# Patient Record
Sex: Female | Born: 1955
Health system: Southern US, Community
[De-identification: ages and names within clinical notes are randomized; demographics above are authoritative.]

## PROBLEM LIST (undated history)

## (undated) DIAGNOSIS — F419 Anxiety disorder, unspecified: Secondary | ICD-10-CM

## (undated) DIAGNOSIS — Z87442 Personal history of urinary calculi: Secondary | ICD-10-CM

## (undated) DIAGNOSIS — E785 Hyperlipidemia, unspecified: Secondary | ICD-10-CM

## (undated) DIAGNOSIS — S82141A Displaced bicondylar fracture of right tibia, initial encounter for closed fracture: Secondary | ICD-10-CM

## (undated) DIAGNOSIS — F32A Depression, unspecified: Secondary | ICD-10-CM

## (undated) DIAGNOSIS — F329 Major depressive disorder, single episode, unspecified: Secondary | ICD-10-CM

## (undated) DIAGNOSIS — I10 Essential (primary) hypertension: Secondary | ICD-10-CM

## (undated) DIAGNOSIS — T7840XA Allergy, unspecified, initial encounter: Secondary | ICD-10-CM

## (undated) DIAGNOSIS — M7660 Achilles tendinitis, unspecified leg: Secondary | ICD-10-CM

## (undated) DIAGNOSIS — A64 Unspecified sexually transmitted disease: Secondary | ICD-10-CM

## (undated) DIAGNOSIS — S82121A Displaced fracture of lateral condyle of right tibia, initial encounter for closed fracture: Secondary | ICD-10-CM

## (undated) HISTORY — DX: Allergy, unspecified, initial encounter: T78.40XA

## (undated) HISTORY — DX: Hypercalcemia: E83.52

## (undated) HISTORY — DX: Personal history of urinary calculi: Z87.442

## (undated) HISTORY — PX: CHOLECYSTECTOMY: SHX55

## (undated) HISTORY — DX: Unspecified sexually transmitted disease: A64

## (undated) HISTORY — DX: Achilles tendinitis, unspecified leg: M76.60

## (undated) HISTORY — PX: TONSILLECTOMY: SUR1361

## (undated) HISTORY — DX: Essential (primary) hypertension: I10

## (undated) HISTORY — PX: APPENDECTOMY: SHX54

## (undated) HISTORY — DX: Hyperlipidemia, unspecified: E78.5

---

## 1997-10-19 ENCOUNTER — Other Ambulatory Visit: Admission: RE | Admit: 1997-10-19 | Discharge: 1997-10-19 | Payer: Self-pay | Admitting: Obstetrics and Gynecology

## 1997-11-19 ENCOUNTER — Encounter: Admission: RE | Admit: 1997-11-19 | Discharge: 1998-02-17 | Payer: Self-pay | Admitting: Family Medicine

## 1999-05-23 ENCOUNTER — Other Ambulatory Visit: Admission: RE | Admit: 1999-05-23 | Discharge: 1999-05-23 | Payer: Self-pay | Admitting: *Deleted

## 2000-10-21 ENCOUNTER — Other Ambulatory Visit: Admission: RE | Admit: 2000-10-21 | Discharge: 2000-10-21 | Payer: Self-pay | Admitting: *Deleted

## 2000-10-26 ENCOUNTER — Ambulatory Visit (HOSPITAL_COMMUNITY): Admission: RE | Admit: 2000-10-26 | Discharge: 2000-10-26 | Payer: Self-pay | Admitting: *Deleted

## 2000-10-26 ENCOUNTER — Encounter: Payer: Self-pay | Admitting: Obstetrics and Gynecology

## 2002-05-30 ENCOUNTER — Other Ambulatory Visit: Admission: RE | Admit: 2002-05-30 | Discharge: 2002-05-30 | Payer: Self-pay | Admitting: Obstetrics and Gynecology

## 2002-06-02 ENCOUNTER — Encounter: Payer: Self-pay | Admitting: Obstetrics and Gynecology

## 2002-06-02 ENCOUNTER — Ambulatory Visit (HOSPITAL_COMMUNITY): Admission: RE | Admit: 2002-06-02 | Discharge: 2002-06-02 | Payer: Self-pay | Admitting: Obstetrics and Gynecology

## 2003-07-23 ENCOUNTER — Other Ambulatory Visit: Admission: RE | Admit: 2003-07-23 | Discharge: 2003-07-23 | Payer: Self-pay | Admitting: Obstetrics and Gynecology

## 2004-07-30 ENCOUNTER — Other Ambulatory Visit: Admission: RE | Admit: 2004-07-30 | Discharge: 2004-07-30 | Payer: Self-pay | Admitting: Obstetrics and Gynecology

## 2004-10-15 ENCOUNTER — Encounter: Admission: RE | Admit: 2004-10-15 | Discharge: 2004-10-15 | Payer: Self-pay | Admitting: Orthopedic Surgery

## 2005-11-02 LAB — CONVERTED CEMR LAB: Pap Smear: NORMAL

## 2005-11-11 ENCOUNTER — Ambulatory Visit (HOSPITAL_COMMUNITY): Admission: RE | Admit: 2005-11-11 | Discharge: 2005-11-11 | Payer: Self-pay | Admitting: Obstetrics and Gynecology

## 2005-11-23 ENCOUNTER — Other Ambulatory Visit: Admission: RE | Admit: 2005-11-23 | Discharge: 2005-11-23 | Payer: Self-pay | Admitting: Obstetrics & Gynecology

## 2006-10-20 ENCOUNTER — Encounter: Payer: Self-pay | Admitting: Family Medicine

## 2006-11-18 ENCOUNTER — Ambulatory Visit: Payer: Self-pay | Admitting: Family Medicine

## 2006-11-18 DIAGNOSIS — E1169 Type 2 diabetes mellitus with other specified complication: Secondary | ICD-10-CM | POA: Insufficient documentation

## 2006-11-18 DIAGNOSIS — E785 Hyperlipidemia, unspecified: Secondary | ICD-10-CM

## 2006-11-18 DIAGNOSIS — I10 Essential (primary) hypertension: Secondary | ICD-10-CM | POA: Insufficient documentation

## 2006-11-18 DIAGNOSIS — E7800 Pure hypercholesterolemia, unspecified: Secondary | ICD-10-CM | POA: Insufficient documentation

## 2006-11-18 DIAGNOSIS — E119 Type 2 diabetes mellitus without complications: Secondary | ICD-10-CM | POA: Insufficient documentation

## 2006-11-18 DIAGNOSIS — J309 Allergic rhinitis, unspecified: Secondary | ICD-10-CM | POA: Insufficient documentation

## 2006-11-25 ENCOUNTER — Encounter: Payer: Self-pay | Admitting: Family Medicine

## 2006-12-04 LAB — CONVERTED CEMR LAB: Pap Smear: NORMAL

## 2006-12-22 ENCOUNTER — Telehealth: Payer: Self-pay | Admitting: Family Medicine

## 2006-12-27 ENCOUNTER — Other Ambulatory Visit: Admission: RE | Admit: 2006-12-27 | Discharge: 2006-12-27 | Payer: Self-pay | Admitting: Obstetrics & Gynecology

## 2006-12-31 ENCOUNTER — Encounter: Payer: Self-pay | Admitting: Family Medicine

## 2007-01-13 ENCOUNTER — Ambulatory Visit: Payer: Self-pay | Admitting: Family Medicine

## 2007-01-13 LAB — CONVERTED CEMR LAB: Cholesterol, target level: 200 mg/dL

## 2007-01-18 LAB — CONVERTED CEMR LAB
AST: 26 units/L (ref 0–37)
CO2: 27 meq/L (ref 19–32)
Chloride: 104 meq/L (ref 96–112)
Cholesterol: 155 mg/dL (ref 0–200)
Creatinine, Ser: 0.6 mg/dL (ref 0.4–1.2)
Creatinine,U: 104.3 mg/dL
Glucose, Bld: 246 mg/dL — ABNORMAL HIGH (ref 70–99)
HDL: 37.7 mg/dL — ABNORMAL LOW (ref >39.0)
Microalb, Ur: 0.7 mg/dL (ref 0.0–1.9)
Potassium: 4.3 meq/L (ref 3.5–5.1)
Sodium: 138 meq/L (ref 135–145)
Total Bilirubin: 0.6 mg/dL (ref 0.3–1.2)
Total Protein: 6.6 g/dL (ref 6.0–8.3)
Triglycerides: 110 mg/dL (ref 0–149)

## 2007-02-08 ENCOUNTER — Encounter: Payer: Self-pay | Admitting: Family Medicine

## 2007-03-31 ENCOUNTER — Ambulatory Visit (HOSPITAL_COMMUNITY): Admission: RE | Admit: 2007-03-31 | Discharge: 2007-03-31 | Payer: Self-pay | Admitting: Obstetrics and Gynecology

## 2007-04-18 ENCOUNTER — Telehealth: Payer: Self-pay | Admitting: Family Medicine

## 2007-04-20 ENCOUNTER — Encounter: Payer: Self-pay | Admitting: Family Medicine

## 2007-04-22 ENCOUNTER — Encounter (INDEPENDENT_AMBULATORY_CARE_PROVIDER_SITE_OTHER): Payer: Self-pay | Admitting: *Deleted

## 2007-04-22 ENCOUNTER — Ambulatory Visit: Payer: Self-pay | Admitting: Family Medicine

## 2007-04-26 ENCOUNTER — Ambulatory Visit: Payer: Self-pay | Admitting: Family Medicine

## 2007-08-09 ENCOUNTER — Encounter (INDEPENDENT_AMBULATORY_CARE_PROVIDER_SITE_OTHER): Payer: Self-pay | Admitting: *Deleted

## 2007-08-26 ENCOUNTER — Encounter: Admission: RE | Admit: 2007-08-26 | Discharge: 2007-08-26 | Payer: Self-pay | Admitting: General Surgery

## 2007-08-29 ENCOUNTER — Encounter (INDEPENDENT_AMBULATORY_CARE_PROVIDER_SITE_OTHER): Payer: Self-pay | Admitting: General Surgery

## 2007-08-29 ENCOUNTER — Ambulatory Visit (HOSPITAL_BASED_OUTPATIENT_CLINIC_OR_DEPARTMENT_OTHER): Admission: RE | Admit: 2007-08-29 | Discharge: 2007-08-29 | Payer: Self-pay | Admitting: General Surgery

## 2007-08-30 ENCOUNTER — Ambulatory Visit: Payer: Self-pay | Admitting: Family Medicine

## 2007-08-31 LAB — CONVERTED CEMR LAB
Albumin: 3.9 g/dL (ref 3.5–5.2)
Alkaline Phosphatase: 65 units/L (ref 39–117)
BUN: 17 mg/dL (ref 6–23)
Calcium: 9.7 mg/dL (ref 8.4–10.5)
Cholesterol: 134 mg/dL (ref 0–200)
GFR calc Af Amer: 134 mL/min
GFR calc non Af Amer: 111 mL/min
Glucose, Bld: 241 mg/dL — ABNORMAL HIGH (ref 70–99)
HDL: 42.1 mg/dL (ref >39.0)
LDL Cholesterol: 71 mg/dL (ref 0–99)
Potassium: 4.1 meq/L (ref 3.5–5.1)
Sodium: 138 meq/L (ref 135–145)
Total Protein: 6.6 g/dL (ref 6.0–8.3)
VLDL: 21 mg/dL (ref 0–40)

## 2007-09-02 ENCOUNTER — Ambulatory Visit: Payer: Self-pay | Admitting: Family Medicine

## 2007-09-29 ENCOUNTER — Ambulatory Visit: Payer: Self-pay | Admitting: Family Medicine

## 2007-09-30 ENCOUNTER — Telehealth: Payer: Self-pay | Admitting: Family Medicine

## 2007-10-27 ENCOUNTER — Encounter: Payer: Self-pay | Admitting: Family Medicine

## 2007-11-11 ENCOUNTER — Telehealth: Payer: Self-pay | Admitting: Family Medicine

## 2007-11-28 ENCOUNTER — Ambulatory Visit: Payer: Self-pay | Admitting: Family Medicine

## 2007-11-29 LAB — CONVERTED CEMR LAB: Hgb A1c MFr Bld: 7 % — ABNORMAL HIGH (ref 4.6–6.0)

## 2007-11-30 ENCOUNTER — Ambulatory Visit: Payer: Self-pay | Admitting: Family Medicine

## 2007-12-04 LAB — HM MAMMOGRAPHY: HM Mammogram: NORMAL

## 2008-02-22 ENCOUNTER — Ambulatory Visit: Payer: Self-pay | Admitting: Family Medicine

## 2008-02-27 ENCOUNTER — Ambulatory Visit: Payer: Self-pay | Admitting: Family Medicine

## 2008-02-27 DIAGNOSIS — F329 Major depressive disorder, single episode, unspecified: Secondary | ICD-10-CM | POA: Insufficient documentation

## 2008-02-27 DIAGNOSIS — F3289 Other specified depressive episodes: Secondary | ICD-10-CM | POA: Insufficient documentation

## 2008-02-27 LAB — CONVERTED CEMR LAB
ALT: 36 units/L — ABNORMAL HIGH (ref 0–35)
AST: 24 units/L (ref 0–37)
Albumin: 3.9 g/dL (ref 3.5–5.2)
Alkaline Phosphatase: 68 units/L (ref 39–117)
BUN: 14 mg/dL (ref 6–23)
CO2: 28 meq/L (ref 19–32)
Chloride: 109 meq/L (ref 96–112)
Cholesterol: 134 mg/dL (ref 0–200)
Creatinine, Ser: 0.6 mg/dL (ref 0.4–1.2)
Creatinine,U: 147.2 mg/dL
GFR calc non Af Amer: 111 mL/min
Glucose, Bld: 141 mg/dL — ABNORMAL HIGH (ref 70–99)
Hgb A1c MFr Bld: 7.1 % — ABNORMAL HIGH (ref 4.6–6.0)
LDL Cholesterol: 81 mg/dL (ref 0–99)
Potassium: 4.5 meq/L (ref 3.5–5.1)
Total Protein: 6.5 g/dL (ref 6.0–8.3)
Triglycerides: 71 mg/dL (ref 0–149)

## 2008-03-23 ENCOUNTER — Ambulatory Visit: Payer: Self-pay | Admitting: Family Medicine

## 2008-03-23 LAB — CONVERTED CEMR LAB
Albumin: 4.1 g/dL (ref 3.5–5.2)
BUN: 15 mg/dL (ref 6–23)
Bilirubin, Direct: 0.1 mg/dL (ref 0.0–0.3)
Calcium: 9.9 mg/dL (ref 8.4–10.5)
Eosinophils Absolute: 0.2 10*3/uL (ref 0.0–0.7)
GFR calc Af Amer: 134 mL/min
GFR calc non Af Amer: 111 mL/min
Glucose, Bld: 163 mg/dL — ABNORMAL HIGH (ref 70–99)
HCT: 38.6 % (ref 36.0–46.0)
Hemoglobin: 13.6 g/dL (ref 12.0–15.0)
MCHC: 35.3 g/dL (ref 30.0–36.0)
MCV: 95.1 fL (ref 78.0–100.0)
Monocytes Absolute: 0.5 10*3/uL (ref 0.1–1.0)
Monocytes Relative: 6.3 % (ref 3.0–12.0)
Neutro Abs: 3.8 10*3/uL (ref 1.4–7.7)
Platelets: 249 10*3/uL (ref 150–400)
Potassium: 4.6 meq/L (ref 3.5–5.1)
RDW: 11.7 % (ref 11.5–14.6)
Sodium: 141 meq/L (ref 135–145)
Total Protein: 7.1 g/dL (ref 6.0–8.3)

## 2008-03-27 ENCOUNTER — Telehealth: Payer: Self-pay | Admitting: Family Medicine

## 2008-07-13 ENCOUNTER — Ambulatory Visit: Payer: Self-pay | Admitting: Family Medicine

## 2008-07-13 LAB — CONVERTED CEMR LAB: Hgb A1c MFr Bld: 7.6 % — ABNORMAL HIGH (ref 4.6–6.5)

## 2008-07-16 ENCOUNTER — Ambulatory Visit: Payer: Self-pay | Admitting: Family Medicine

## 2008-09-07 ENCOUNTER — Telehealth: Payer: Self-pay | Admitting: Family Medicine

## 2008-09-17 ENCOUNTER — Telehealth: Payer: Self-pay | Admitting: Family Medicine

## 2008-09-18 ENCOUNTER — Telehealth: Payer: Self-pay | Admitting: Family Medicine

## 2008-09-25 ENCOUNTER — Ambulatory Visit (HOSPITAL_COMMUNITY): Admission: RE | Admit: 2008-09-25 | Discharge: 2008-09-25 | Payer: Self-pay | Admitting: Obstetrics and Gynecology

## 2008-10-05 ENCOUNTER — Ambulatory Visit: Payer: Self-pay | Admitting: Family Medicine

## 2008-10-05 ENCOUNTER — Telehealth: Payer: Self-pay | Admitting: Family Medicine

## 2008-10-05 LAB — CONVERTED CEMR LAB
ALT: 46 units/L — ABNORMAL HIGH (ref 0–35)
AST: 35 units/L (ref 0–37)
Albumin: 4.4 g/dL (ref 3.5–5.2)
Alkaline Phosphatase: 57 units/L (ref 39–117)
Basophils Relative: 0.2 % (ref 0.0–3.0)
Calcium: 10.4 mg/dL (ref 8.4–10.5)
Eosinophils Relative: 1.3 % (ref 0.0–5.0)
GFR calc non Af Amer: 92.29 mL/min (ref >60)
HCT: 41.1 % (ref 36.0–46.0)
Hemoglobin: 14.2 g/dL (ref 12.0–15.0)
LDL Cholesterol: 98 mg/dL (ref 0–99)
Lymphocytes Relative: 35.9 % (ref 12.0–46.0)
Monocytes Relative: 9 % (ref 3.0–12.0)
Neutro Abs: 4.9 10*3/uL (ref 1.4–7.7)
Potassium: 4.4 meq/L (ref 3.5–5.1)
RBC: 4.26 M/uL (ref 3.87–5.11)
Sodium: 139 meq/L (ref 135–145)
Total CHOL/HDL Ratio: 4
Total Protein: 7.3 g/dL (ref 6.0–8.3)
Triglycerides: 119 mg/dL (ref 0.0–149.0)

## 2009-06-03 ENCOUNTER — Telehealth: Payer: Self-pay | Admitting: Family Medicine

## 2009-06-06 ENCOUNTER — Ambulatory Visit: Payer: Self-pay | Admitting: Family Medicine

## 2009-06-06 LAB — CONVERTED CEMR LAB
Albumin: 4 g/dL (ref 3.5–5.2)
Alkaline Phosphatase: 67 units/L (ref 39–117)
Chloride: 105 meq/L (ref 96–112)
Cholesterol: 169 mg/dL (ref 0–200)
Creatinine,U: 85.2 mg/dL
GFR calc non Af Amer: 126.91 mL/min (ref >60)
LDL Cholesterol: 106 mg/dL — ABNORMAL HIGH (ref 0–99)
Microalb Creat Ratio: 1.5 mg/g (ref 0.0–30.0)
Microalb, Ur: 1.3 mg/dL (ref 0.0–1.9)
Total Protein: 6.8 g/dL (ref 6.0–8.3)
Triglycerides: 80 mg/dL (ref 0.0–149.0)

## 2009-06-11 ENCOUNTER — Ambulatory Visit: Payer: Self-pay | Admitting: Family Medicine

## 2009-06-11 DIAGNOSIS — S335XXA Sprain of ligaments of lumbar spine, initial encounter: Secondary | ICD-10-CM | POA: Insufficient documentation

## 2009-08-15 ENCOUNTER — Telehealth (INDEPENDENT_AMBULATORY_CARE_PROVIDER_SITE_OTHER): Payer: Self-pay | Admitting: *Deleted

## 2009-08-29 ENCOUNTER — Telehealth: Payer: Self-pay | Admitting: Family Medicine

## 2009-10-08 ENCOUNTER — Emergency Department (HOSPITAL_COMMUNITY): Admission: EM | Admit: 2009-10-08 | Discharge: 2009-10-08 | Payer: Self-pay | Admitting: Emergency Medicine

## 2009-10-08 DIAGNOSIS — Z87442 Personal history of urinary calculi: Secondary | ICD-10-CM

## 2009-10-08 HISTORY — DX: Personal history of urinary calculi: Z87.442

## 2009-10-11 ENCOUNTER — Encounter: Payer: Self-pay | Admitting: Family Medicine

## 2009-10-17 ENCOUNTER — Telehealth: Payer: Self-pay | Admitting: Family Medicine

## 2009-10-18 ENCOUNTER — Encounter: Payer: Self-pay | Admitting: Family Medicine

## 2009-11-18 ENCOUNTER — Encounter (INDEPENDENT_AMBULATORY_CARE_PROVIDER_SITE_OTHER): Payer: Self-pay | Admitting: *Deleted

## 2010-02-23 ENCOUNTER — Encounter: Payer: Self-pay | Admitting: Obstetrics and Gynecology

## 2010-03-04 NOTE — Progress Notes (Signed)
Summary: refill request for mobic  Phone Note Refill Request Message from:  Fax from Pharmacy  Refills Requested: Medication #1:  MELOXICAM 15 MG TABS 1 tab by mouth daily   Last Refilled: 06/11/2009 Faxed request from [facility]   Initial call taken by: Lowella Petties CMA,  August 29, 2009 2:09 PM    Prescriptions: MELOXICAM 15 MG TABS (MELOXICAM) 1 tab by mouth daily  #30 x 0   Entered and Authorized by:   Hannah Beat MD   Signed by:   Hannah Beat MD on 08/29/2009   Method used:   Electronically to        [facility]* (retail)       [address]       [place], [place]  [address]       Ph: [phone]       Fax: [phone]   RxID:   [id number]

## 2010-03-04 NOTE — Progress Notes (Signed)
Summary: needs order for labs prior to visit  Phone Note Call from Patient Call back at Home Phone [phone] Call back at [phone]   Caller: Patient Call For: Kerby Nora MD Summary of Call: Pt is coming in next week for a follow up visit and wants to come in  prior for lab work.  What do you want to order? Initial call taken by: Lowella Petties CMA,  Jun 03, 2009 2:28 PM  Follow-up for Phone Call        Dx 250.00 microalbumin A1C, CMET, lipids fasting Follow-up by: Kerby Nora MD,  Jun 04, 2009 8:18 AM  Additional Follow-up for Phone Call Additional follow up Details #1::        Left message for patient on voice mail with lab appt info Additional Follow-up by: Benny Lennert CMA Duncan Dull),  Jun 04, 2009 9:51 AM

## 2010-03-04 NOTE — Assessment & Plan Note (Signed)
Summary: EXAM FOR MEDS REFILL / LFW   Vital Signs:  Patient profile:   55 year old female Height:      67 inches Weight:      174.6 pounds BMI:     27.45 Temp:     98.0 degrees F oral Pulse rate:   80 / minute Pulse rhythm:   regular BP sitting:   120 / 80  (left arm) Cuff size:   regular  Vitals Entered By: Benny Lennert CMA Duncan Dull) (Jun 11, 2009 2:45 PM)  History of Present Illness: Chief complaint exam for medication refill  Increase stress in last 6 months. Mother ill. Looking into nursing home.   Central low back, no radiating pain into legs. No numbness, no weakness No injury, fall. Using motrin and tylenol, minimal relief. Does a lot of bending and lifting at work.  4/10...recently pain is daily in last few days.   DM, inadequate control Working on healthy diet and exercise. Byetta helped dramatically..held due to concern of abdoiminal pain..but lipase was nml. Aabd symptoms resolved with PPI and decreasing caffeine. She is interested in trying Byetta again.   Lipid Management History:      Positive NCEP/ATP III risk factors include female age 80 years old or older, diabetes, and hypertension.  Negative NCEP/ATP III risk factors include non-tobacco-user status.        The patient states that she does not know about the "Therapeutic Lifestyle Change" diet.  Her compliance with the TLC diet is good.    Problems Prior to Update: 1)  Depression, Mild  (ICD-311) 2)  Allergic Rhinitis  (ICD-477.9) 3)  Hyperlipidemia  (ICD-272.4) 4)  Hypertension  (ICD-401.9) 5)  Diabetes Mellitus, Type II  (ICD-250.00)  Current Medications (verified): 1)  Fish Oil   Oil (Fish Oil) .... Once Daily 2)  Allegra 180 Mg  Tabs (Fexofenadine Hcl) .Marland Kitchen.. 1 By Mouth Once Daily As Needed 3)  Pravachol 40 Mg  Tabs (Pravastatin Sodium) .Marland Kitchen.. 1 By Mouth At Bedtime 4)  Glipizide Xl 10 Mg  Tb24 (Glipizide) .Marland Kitchen.. 1 By Mouth Two Times A Day 5)  Lisinopril 20 Mg  Tabs (Lisinopril) .... Take 1 Tablet By  Mouth Once A Day 6)  Vitamin D [address] Unit  Caps (Ergocalciferol) .Marland Kitchen.. 1 By Mouth Every Other Week 7)  Metformin Hcl 500 Mg  Tb24 (Metformin Hcl) .... Extended Release Take 2 Tablet By Mouth Two Times A Day 8)  Valtrex 1 Gm  Tabs (Valacyclovir Hcl) .... Take 1/2 Tablet Once Daily To 1/2 Tablet Two Times A Day  As Needed For Symptoms 9)  Baby Aspirin 81 Mg  Chew (Aspirin) .... Take 1 Tablet By Mouth Once A Day 10)  Onetouch Ultra Test   Strp (Glucose Blood) .... Check Blood Sugar Up To 4 Times Per Day. 11)  Flunisolide 0.025 % Soln (Flunisolide) .... 2 Sprays Per Nostril Daily 12)  Lancet Device For One Touch .... Check Blood Sugars 1-2 Times Per Day Dx 250.00 13)  Fexofenadine Hcl 180 Mg Tabs (Fexofenadine Hcl) .... Take 1 Tablet By Mouth Once A Day  Allergies (verified): No Known Drug Allergies  Past History:  Past medical, surgical, family and social histories (including risk factors) reviewed, and no changes noted (except as noted below).  Past Medical History: Reviewed history from 11/18/2006 and no changes required. Diabetes mellitus, type II Hypertension Hyperlipidemia Allergic rhinitis  Past Surgical History: Reviewed history from 11/18/2006 and no changes required. Appendectomy Cholecystectomy Tonsillectomy  Family History: Reviewed history  from 11/18/2006 and no changes required. father died age 67 DM, depression mother age 60 DM, macular degeneration, COPD brother: DM no MI < age 60 grandparents:liver cancer, DM  Social History: Reviewed history from 11/18/2006 and no changes required. Never Smoked Alcohol use-yes, 1 beer  1 per week Occupation: [employer] Married Drug use-no Regular exercise-yes, treadmill 15 min 3 days per week 4 kids  Review of Systems General:  Denies fatigue and fever. CV:  Denies chest pain or discomfort. Resp:  Denies shortness of breath. GI:  Denies abdominal pain, constipation, and diarrhea. GU:  Denies dysuria.  Physical  Exam  General:  obese appearing female in NAD Mouth:  MMM Neck:  No deformities, masses, or tenderness noted. Lungs:  Normal respiratory effort, chest expands symmetrically. Lungs are clear to auscultation, no crackles or wheezes. Heart:  Normal rate and regular rhythm. S1 and S2 normal without gallop, murmur, click, rub or other extra sounds. Abdomen:  Bowel sounds positive,abdomen soft and mildly tender to palpation in epigastrum without masses, organomegaly or hernias noted. Msk:  No deformity or scoliosis noted of thoracic or lumbar spine.  B paraspinous muscle ttp, neg Faber's and SLR.  Pulses:  R and L posterior tibial pulses are full and equal bilaterally  Extremities:  no edema Neurologic:  No cranial nerve deficits noted. Station and gait are normal. Plantar reflexes are down-going bilaterally. DTRs are symmetrical throughout. Sensory, motor and coordinative functions appear intact.  Diabetes Management Exam:    Foot Exam (with socks and/or shoes not present):       Sensory-Pinprick/Light touch:          Left medial foot (L-4): normal          Left dorsal foot (L-5): normal          Left lateral foot (S-1): normal          Right medial foot (L-4): normal          Right dorsal foot (L-5): normal          Right lateral foot (S-1): normal       Sensory-Monofilament:          Left foot: normal          Right foot: normal       Inspection:          Left foot: normal          Right foot: normal       Nails:          Left foot: normal          Right foot: normal   Impression & Recommendations:  Problem # 1:  DIABETES MELLITUS, TYPE II (ICD-250.00) Inadequate control. Restart Byetta.  Her updated medication list for this problem includes:    Glipizide Xl 10 Mg Tb24 (Glipizide) .Marland Kitchen... 1 by mouth two times a day    Lisinopril 20 Mg Tabs (Lisinopril) .Marland Kitchen... Take 1 tablet by mouth once a day    Metformin Hcl 500 Mg Tb24 (Metformin hcl) ..... Extended release take 2 tablet by mouth  two times a day    Baby Aspirin 81 Mg Chew (Aspirin) .Marland Kitchen... Take 1 tablet by mouth once a day    Byetta 5 Mcg Pen 5 Mcg/0.20ml Soln (Exenatide) .Marland Kitchen... 1-2 times daily..increase slowly as tolerated to two times a day.  Labs Reviewed: Creat: 0.7 (10/05/2008)     Last Eye Exam: normal (11/03/2007) Reviewed HgBA1c results: 7.7 (10/05/2008)  7.6 (07/13/2008)  Problem #  2:  LUMBAR STRAIN, ACUTE (ICD-847.2) NSAIDS with PPI given GERD/gastritis in past. Heat, INfo given on back injury prevention and core strengthening, ROM exercises.   Problem # 3:  HYPERTENSION (ICD-401.9) Well controlled. Continue current medication.  Her updated medication list for this problem includes:    Lisinopril 20 Mg Tabs (Lisinopril) .Marland Kitchen... Take 1 tablet by mouth once a day  BP today: 120/80 Prior BP: 120/70 (10/05/2008)  Prior 10 Yr Risk Heart Disease: 8 % (02/27/2008)  Labs Reviewed: K+: 4.4 (10/05/2008) Creat: : 0.7 (10/05/2008)   Chol: 164 (10/05/2008)   HDL: 42.50 (10/05/2008)   LDL: 98 (10/05/2008)   TG: 119.0 (10/05/2008)  Complete Medication List: 1)  Fish Oil Oil (Fish oil) .... Once daily 2)  Allegra 180 Mg Tabs (Fexofenadine hcl) .Marland Kitchen.. 1 by mouth once daily as needed 3)  Pravachol 40 Mg Tabs (Pravastatin sodium) .Marland Kitchen.. 1 by mouth at bedtime 4)  Glipizide Xl 10 Mg Tb24 (Glipizide) .Marland Kitchen.. 1 by mouth two times a day 5)  Lisinopril 20 Mg Tabs (Lisinopril) .... Take 1 tablet by mouth once a day 6)  Vitamin D [address] Unit Caps (Ergocalciferol) .Marland Kitchen.. 1 by mouth every other week 7)  Metformin Hcl 500 Mg Tb24 (Metformin hcl) .... Extended release take 2 tablet by mouth two times a day 8)  Valtrex 1 Gm Tabs (Valacyclovir hcl) .... Take 1/2 tablet once daily to 1/2 tablet two times a day  as needed for symptoms 9)  Baby Aspirin 81 Mg Chew (Aspirin) .... Take 1 tablet by mouth once a day 10)  Onetouch Ultra Test Strp (Glucose blood) .... Check blood sugar up to 4 times per day. 11)  Flunisolide 0.025 % Soln  (Flunisolide) .... 2 sprays per nostril daily 12)  Lancet Device For One Touch  .... Check blood sugars 1-2 times per day dx 250.00 13)  Fexofenadine Hcl 180 Mg Tabs (Fexofenadine hcl) .... Take 1 tablet by mouth once a day 14)  Byetta 5 Mcg Pen 5 Mcg/0.36ml Soln (Exenatide) .Marland Kitchen.. 1-2 times daily..increase slowly as tolerated to two times a day. 15)  Meloxicam 15 Mg Tabs (Meloxicam) .Marland Kitchen.. 1 tab by mouth daily 16)  Sm Omeprazole 20 Mg Tbec (Omeprazole) .... Take 1 tablet by mouth once a day  Lipid Assessment/Plan:      Based on NCEP/ATP III, the patient's risk factor category is "history of diabetes".  The patient's lipid goals are as follows: Total cholesterol goal is 200; LDL cholesterol goal is 100; HDL cholesterol goal is 40; Triglyceride goal is 150.  Her LDL cholesterol goal has been met.     Patient Instructions: 1)  Meloxicam daily as needed back pain. 2)  Can take omeprazole (prilosec) at same time to protect stomach.  3)  Once back pain improved and off antinflammatory.Marland KitchenMarland KitchenMarland KitchenStart Byetta slowly..gradually increase to daily if no side effects. 4)  Please schedule a follow-up appointment in 3 months .  5)  BMP prior to visit, ICD-9: 250.00 6)  Hepatic Panel prior to visit ICD-9:  7)  Lipid panel prior to visit ICD-9 :  8)  HgBA1c prior to visit  ICD-9:  Prescriptions: MELOXICAM 15 MG TABS (MELOXICAM) 1 tab by mouth daily  #30 x 0   Entered and Authorized by:   Kerby Nora MD   Signed by:   Kerby Nora MD on 06/11/2009   Method used:   Electronically to        [facility]* (retail)       [address]  [place], [place]  [address]       Ph: [phone]       Fax: [phone]   RxID:   [id number] FEXOFENADINE HCL 180 MG TABS (FEXOFENADINE HCL) Take 1 tablet by mouth once a day  #90 x 3   Entered and Authorized by:   Kerby Nora MD   Signed by:   Kerby Nora MD on 06/11/2009   Method used:   Electronically to        [facility]* (retail)       [address]       [place], [place]  [address]       Ph: [phone]       Fax: [phone]   RxID:   [id number] VALTREX 1 GM  TABS (VALACYCLOVIR HCL) Take 1/2 tablet once daily to 1/2 tablet two times a day  as needed for symptoms  #30 x 0   Entered and Authorized by:   Kerby Nora MD   Signed by:   Kerby Nora MD on 06/11/2009   Method used:   Electronically to        [facility]* (retail)       [address]       [place], [place]  [address]       Ph: [phone]       Fax: [phone]   RxID:   [id number] METFORMIN HCL 500 MG  TB24 (METFORMIN HCL) extended release Take 2 tablet by mouth two times a day  #360 x 3   Entered and Authorized by:   Kerby Nora MD   Signed by:   Kerby Nora MD on 06/11/2009   Method used:   Electronically to        [facility]* (retail)       [address]       [place], [place]  [address]       Ph: [phone]       Fax: [phone]   RxID:   [id number] LISINOPRIL 20 MG  TABS (LISINOPRIL) Take 1 tablet by mouth once a day  #90 x 3   Entered and Authorized by:   Kerby Nora MD   Signed by:   Kerby Nora MD on 06/11/2009   Method used:   Electronically to        [facility]* (retail)       [address]       [place], [place]  [address]       Ph: [phone]       Fax: [phone]   RxID:   [id number] GLIPIZIDE XL 10 MG  TB24 (GLIPIZIDE) 1 by mouth two times a day  #180 x 3   Entered and Authorized by:   Kerby Nora MD   Signed by:   Kerby Nora MD on 06/11/2009   Method used:   Electronically to        [facility]* (retail)       [address]       [place], [place]  [address]       Ph: [phone]       Fax: [phone]   RxID:   [id number] PRAVACHOL 40 MG  TABS (PRAVASTATIN SODIUM) 1 by mouth at bedtime  #90 x 3   Entered and Authorized by:   Kerby Nora MD   Signed by:   Kerby Nora MD on 06/11/2009   Method used:   Electronically to        [facility]* (retail)  [address]       [place], [place]   [address]       Ph: [phone]       Fax: [phone]   RxID:   [id number] BYETTA 5 MCG PEN 5 MCG/0.02ML SOLN (EXENATIDE) 1-2 times daily..increase slowly as tolerated to two times a day.  #1 box x 11   Entered and Authorized by:   Kerby Nora MD   Signed by:   Kerby Nora MD on 06/11/2009   Method used:   Electronically to        [facility]* (retail)       [address]       [place], [place]  [address]       Ph: [phone]       Fax: [phone]   RxID:   [id number]   Current Allergies (reviewed today): No known allergies

## 2010-03-04 NOTE — Progress Notes (Signed)
Summary: refill  request for flunisolide  Phone Note Refill Request Message from:  Fax from Pharmacy  Refills Requested: Medication #1:  FLUNISOLIDE 0.025 % SOLN 2 sprays per nostril daily   Last Refilled: 03/27/2008 Faxed request from [facility].  Initial call taken by: Lowella Petties CMA,  October 17, 2009 5:00 PM    Prescriptions: FLUNISOLIDE 0.025 % SOLN (FLUNISOLIDE) 2 sprays per nostril daily  #1 x 11   Entered and Authorized by:   Kerby Nora MD   Signed by:   Kerby Nora MD on 10/18/2009   Method used:   Electronically to        [facility]* (retail)       [address]       [place], [place]  [address]       Ph: [phone]       Fax: [phone]   RxID:   [id number]

## 2010-03-04 NOTE — Letter (Signed)
Summary: [facility]   Imported By: Sherian Rein 10/19/2009 09:07:44  _____________________________________________________________________  External Attachment:    Type:   Image     Comment:   External Document

## 2010-03-04 NOTE — Progress Notes (Signed)
       New/Updated Medications: ULTICARE SHORT PEN NEEDLES 31G X 8 MM MISC (INSULIN PEN NEEDLE) use as directed Prescriptions: ULTICARE SHORT PEN NEEDLES 31G X 8 MM MISC (INSULIN PEN NEEDLE) use as directed  #1 box x 11   Entered by:   Benny Lennert CMA (AAMA)   Authorized by:   Kerby Nora MD   Signed by:   Benny Lennert CMA (AAMA) on 08/15/2009   Method used:   Electronically to        [facility]* (retail)       [address]       [place], [place]  [address]       Ph: [phone]       Fax: [phone]   RxID:   [id number]

## 2010-03-04 NOTE — Letter (Signed)
Summary: [facility]  [facility]   Imported By: Lanelle Bal 10/29/2009 08:41:39  _____________________________________________________________________  External Attachment:    Type:   Image     Comment:   External Document

## 2010-03-04 NOTE — Miscellaneous (Signed)
  Clinical Lists Changes  Observations: Added new observation of FLU VAX: Historical (11/12/2009 11:23)      Immunization History:  Influenza Immunization History:    Influenza:  historical (11/12/2009)

## 2010-03-11 ENCOUNTER — Other Ambulatory Visit: Payer: Self-pay | Admitting: Obstetrics and Gynecology

## 2010-03-11 DIAGNOSIS — Z1231 Encounter for screening mammogram for malignant neoplasm of breast: Secondary | ICD-10-CM

## 2010-03-24 ENCOUNTER — Ambulatory Visit (HOSPITAL_COMMUNITY): Payer: BC Managed Care – PPO | Attending: Obstetrics and Gynecology

## 2010-04-02 ENCOUNTER — Encounter: Payer: Self-pay | Admitting: Family Medicine

## 2010-04-03 ENCOUNTER — Encounter: Payer: Self-pay | Admitting: Family Medicine

## 2010-04-03 ENCOUNTER — Ambulatory Visit (INDEPENDENT_AMBULATORY_CARE_PROVIDER_SITE_OTHER): Payer: BC Managed Care – PPO | Admitting: Family Medicine

## 2010-04-03 DIAGNOSIS — I10 Essential (primary) hypertension: Secondary | ICD-10-CM

## 2010-04-10 NOTE — Assessment & Plan Note (Signed)
Summary: CHECK BP/CLE  BCBS   Vital Signs:  Patient profile:   55 year old female Height:      67 inches Weight:      173.19 pounds BMI:     27.22 Temp:     98.8 degrees F oral Pulse rate:   80 / minute Pulse rhythm:   regular BP sitting:   130 / 70  (left arm) Cuff size:   regular  Vitals Entered By: Benny Lennert CMA Duncan Dull) (April 03, 2010 9:52 AM)  Serial Vital Signs/Assessments:  Time      Position  BP       Pulse  Resp  Temp     By 10:05 AM            112/70                         Benny Lennert CMA (AAMA)   History of Present Illness: Chief complaint Check Bp  55 year old female:  BP was higher on her machine.  Felt a little light headed.   Got up about five in the morning or so.   180/100 then down diastolic got down to the eighties within about 445 minutes.   Went to the urologist - was normal at their office yesterday.   ROS: GEN: No acute illnesses, no fevers, chills, sweats, fatigue, weight loss, or URI sx. GI: No n/v/d Pulm: No SOB, cough, wheezing Interactive and getting along well at home.  Otherwise, ROS is as per the HPI.   GEN: WDWN, NAD, Non-toxic, A & O x 3 HEENT: Atraumatic, Normocephalic. Neck supple. No masses, No LAD. Ears and Nose: No external deformity. CV: RRR, No M/G/R. No JVD. No thrill. No extra heart sounds. EXTR: No c/c/e NEURO: Normal gait.  PSYCH: Normally interactive. Conversant. Not depressed or anxious appearing.  Calm demeanor.    Allergies (verified): No Known Drug Allergies  Past History:  Past medical, surgical, family and social histories (including risk factors) reviewed, and no changes noted (except as noted below).  Past Medical History: Reviewed history from 11/18/2006 and no changes required. Diabetes mellitus, type II Hypertension Hyperlipidemia Allergic rhinitis  Past Surgical History: Reviewed history from 11/18/2006 and no changes required. Appendectomy Cholecystectomy Tonsillectomy  Family  History: Reviewed history from 11/18/2006 and no changes required. father died age 62 DM, depression mother age 10 DM, macular degeneration, COPD brother: DM no MI < age 71 grandparents:liver cancer, DM  Social History: Reviewed history from 11/18/2006 and no changes required. Never Smoked Alcohol use-yes, 1 beer  1 per week Occupation: [employer] Married Drug use-no Regular exercise-yes, treadmill 15 min 3 days per week 4 kids   Impression & Recommendations:  Problem # 1:  HYPERTENSION (ICD-401.9) i don't think her machine is accurate rechecked x 2 myself, and with nursing, urology, there are multiple manual readings in the last few days  Her updated medication list for this problem includes:    Lisinopril 20 Mg Tabs (Lisinopril) .Marland Kitchen... Take 1 tablet by mouth once a day  Complete Medication List: 1)  Fish Oil Oil (Fish oil) .... Once daily 2)  Allegra 180 Mg Tabs (Fexofenadine hcl) .Marland Kitchen.. 1 by mouth once daily as needed 3)  Pravachol 40 Mg Tabs (Pravastatin sodium) .Marland Kitchen.. 1 by mouth at bedtime 4)  Glipizide Xl 10 Mg Tb24 (Glipizide) .Marland Kitchen.. 1 by mouth two times a day 5)  Lisinopril 20 Mg Tabs (Lisinopril) .... Take 1 tablet by  mouth once a day 6)  Vitamin D [address] Unit Caps (Ergocalciferol) .Marland Kitchen.. 1 by mouth every other week 7)  Metformin Hcl 500 Mg Tb24 (Metformin hcl) .... Extended release take 2 tablet by mouth two times a day 8)  Valtrex 1 Gm Tabs (Valacyclovir hcl) .... Take 1/2 tablet once daily to 1/2 tablet two times a day  as needed for symptoms 9)  Baby Aspirin 81 Mg Chew (Aspirin) .... Take 1 tablet by mouth once a day 10)  Onetouch Ultra Test Strp (Glucose blood) .... Check blood sugar up to 4 times per day. 11)  Flunisolide 0.025 % Soln (Flunisolide) .... 2 sprays per nostril daily 12)  Lancet Device For One Touch  .... Check blood sugars 1-2 times per day dx 250.00 13)  Fexofenadine Hcl 180 Mg Tabs (Fexofenadine hcl) .... Take 1 tablet by mouth once a day 14)  Meloxicam  15 Mg Tabs (Meloxicam) .Marland Kitchen.. 1 tab by mouth daily 15)  Sm Omeprazole 20 Mg Tbec (Omeprazole) .... Take 1 tablet by mouth once a day 16)  Ulticare Short Pen Needles 31g X 8 Mm Misc (Insulin pen needle) .... Use as directed   Orders Added: 1)  Est. Patient Level III [[address]]    Current Allergies (reviewed today): No known allergies

## 2010-04-15 NOTE — Letter (Signed)
Summary: [facility]  [facility]   Imported By: Lanelle Bal 04/07/2010 12:58:26  _____________________________________________________________________  External Attachment:    Type:   Image     Comment:   External Document

## 2010-04-17 LAB — CBC
HCT: 38.4 % (ref 36.0–46.0)
Hemoglobin: 13.6 g/dL (ref 12.0–15.0)
MCHC: 35.4 g/dL (ref 30.0–36.0)

## 2010-04-17 LAB — COMPREHENSIVE METABOLIC PANEL
ALT: 30 U/L (ref 0–35)
Alkaline Phosphatase: 62 U/L (ref 39–117)
CO2: 25 mEq/L (ref 19–32)
Calcium: 10.7 mg/dL — ABNORMAL HIGH (ref 8.4–10.5)
Chloride: 106 mEq/L (ref 96–112)
GFR calc non Af Amer: >60 mL/min (ref >60)
Glucose, Bld: 186 mg/dL — ABNORMAL HIGH (ref 70–99)
Sodium: 140 mEq/L (ref 135–145)
Total Bilirubin: 0.6 mg/dL (ref 0.3–1.2)

## 2010-04-17 LAB — DIFFERENTIAL
Basophils Absolute: 0 10*3/uL (ref 0.0–0.1)
Basophils Relative: 0 % (ref 0–1)
Eosinophils Absolute: 0.1 10*3/uL (ref 0.0–0.7)
Lymphs Abs: 2.5 10*3/uL (ref 0.7–4.0)
Neutrophils Relative %: 78 % — ABNORMAL HIGH (ref 43–77)

## 2010-04-17 LAB — URINE CULTURE
Colony Count: NO GROWTH
Culture: NO GROWTH

## 2010-04-17 LAB — URINALYSIS, ROUTINE W REFLEX MICROSCOPIC
Ketones, ur: NEGATIVE mg/dL
Leukocytes, UA: NEGATIVE
Nitrite: NEGATIVE
Specific Gravity, Urine: 1.03 (ref 1.005–1.030)
pH: 5.5 (ref 5.0–8.0)

## 2010-04-17 LAB — URINE MICROSCOPIC-ADD ON

## 2010-04-17 LAB — LIPASE, BLOOD: Lipase: 76 U/L — ABNORMAL HIGH (ref 11–59)

## 2010-04-17 LAB — GLUCOSE, CAPILLARY

## 2010-06-10 ENCOUNTER — Other Ambulatory Visit: Payer: Self-pay | Admitting: *Deleted

## 2010-06-10 MED ORDER — MELOXICAM 15 MG PO TABS: 15.0000 mg | ORAL_TABLET | Freq: Every day | ORAL | Status: DC

## 2010-06-11 ENCOUNTER — Other Ambulatory Visit: Payer: Self-pay | Admitting: *Deleted

## 2010-06-11 MED ORDER — MELOXICAM 15 MG PO TABS: 15.0000 mg | ORAL_TABLET | Freq: Every day | ORAL | Status: DC

## 2010-06-17 NOTE — Op Note (Signed)
Jean Robinson, ESTELLE                    ACCOUNT NO.:  [id number]   MEDICAL RECORD NO.:  [id number]          PATIENT TYPE:  AMB   LOCATION:  [facility]                          FACILITY:  [facility]   PHYSICIAN:  Adolph Pollack, M.D.DATE OF BIRTH:  01-08-1956   DATE OF PROCEDURE:  08/29/2007  DATE OF DISCHARGE:                               OPERATIVE REPORT   PREOPERATIVE DIAGNOSIS:  Anal condylomata.   POSTOPERATIVE DIAGNOSIS:  Anal condylomata.   PROCEDURE:  Excision and fulguration of anal condylomata.   SURGEON:  Adolph Pollack, MD   ANESTHESIA:  General.   INDICATIONS:  This is a 55 year old female has perianal condylomata, and  I attempted chemical ablation, but she really did not have much of any  response.  She now is here for excision and CO2 laser ablation of anal  condylomata.   TECHNIQUE:  She was seen in the holding area, then brought to the  operating room, placed supine on the operating table and a general  anesthetic was administered.  She was then placed in lithotomy position.  The perianal area was sterilely prepped and draped.  I then performed  digital rectal exam.  No masses were felt.  Anoscopy was performed and  posteriorly she had 2 intra-anal condylomata.  I began by sharply  excising some of the condylomata in the perianal area and sending them  to pathology.  Bleeding was controlled with electrocautery.  I then used  the carbon dioxide laser to ablate other perianal condylomata including  the intra-anal condylomata as well as treat the base of the excised  condyloma with a rim of ablation around it.   After this was performed, I then performed a perianal block with 0.5%  Marcaine with epinephrine.  Neosporin was applied to the wounds, which  were hemostatic.  A sterile dressing was applied.   She tolerated the procedure well without any apparent complications and  was taken to the recovery room in satisfactory condition.  She was given  typewritten  instructions and Vicodin for pain, and will follow up in the  office in 2-3 weeks.      Adolph Pollack, M.D.  Electronically Signed     TJR/MEDQ  D:  08/29/2007  T:  08/30/2007  Job:  [id number]

## 2010-06-28 ENCOUNTER — Other Ambulatory Visit: Payer: Self-pay | Admitting: *Deleted

## 2010-06-28 DIAGNOSIS — E119 Type 2 diabetes mellitus without complications: Secondary | ICD-10-CM

## 2010-06-28 NOTE — Telephone Encounter (Signed)
Can this be refilled? 

## 2010-06-28 NOTE — Telephone Encounter (Signed)
Can these be refilled? When is patient due to see PCP for follow-up on BP and diabetes?

## 2010-06-29 NOTE — Telephone Encounter (Signed)
Overdue for DM follow up.. Please schedule with fasting labs prior.  May refill until date of appt.  Also verify with pt requested dosing and amount of vatrex (does not seem correct unless she is not using as is typically prescribed)

## 2010-07-01 MED ORDER — METFORMIN HCL ER 500 MG PO TB24: 1000.0000 mg | ORAL_TABLET | Freq: Two times a day (BID) | ORAL | Status: DC

## 2010-07-01 MED ORDER — VALACYCLOVIR HCL 500 MG PO TABS: 500.0000 mg | ORAL_TABLET | Freq: Every day | ORAL | Status: DC

## 2010-07-01 MED ORDER — LISINOPRIL 20 MG PO TABS: 20.0000 mg | ORAL_TABLET | Freq: Every day | ORAL | Status: DC

## 2010-07-09 ENCOUNTER — Other Ambulatory Visit: Payer: Self-pay | Admitting: *Deleted

## 2010-07-09 MED ORDER — GLIPIZIDE ER 10 MG PO TB24: 10.0000 mg | ORAL_TABLET | Freq: Every day | ORAL | Status: DC

## 2010-07-10 ENCOUNTER — Other Ambulatory Visit: Payer: Self-pay | Admitting: *Deleted

## 2010-07-10 MED ORDER — GLIPIZIDE ER 10 MG PO TB24: 10.0000 mg | ORAL_TABLET | Freq: Two times a day (BID) | ORAL | Status: DC

## 2010-07-11 ENCOUNTER — Other Ambulatory Visit: Payer: Self-pay | Admitting: *Deleted

## 2010-07-11 MED ORDER — MELOXICAM 15 MG PO TABS: 15.0000 mg | ORAL_TABLET | Freq: Every day | ORAL | Status: DC

## 2010-07-21 ENCOUNTER — Other Ambulatory Visit (INDEPENDENT_AMBULATORY_CARE_PROVIDER_SITE_OTHER): Payer: BC Managed Care – PPO

## 2010-07-21 DIAGNOSIS — E119 Type 2 diabetes mellitus without complications: Secondary | ICD-10-CM

## 2010-07-21 LAB — LIPID PANEL
HDL: 44.6 mg/dL (ref >39.00)
LDL Cholesterol: 114 mg/dL — ABNORMAL HIGH (ref 0–99)
VLDL: 21.6 mg/dL (ref 0.0–40.0)

## 2010-07-21 LAB — COMPREHENSIVE METABOLIC PANEL
ALT: 24 U/L (ref 0–35)
Alkaline Phosphatase: 69 U/L (ref 39–117)
Creatinine, Ser: 0.6 mg/dL (ref 0.4–1.2)
Sodium: 140 mEq/L (ref 135–145)
Total Bilirubin: 0.5 mg/dL (ref 0.3–1.2)
Total Protein: 7.1 g/dL (ref 6.0–8.3)

## 2010-07-21 LAB — HEMOGLOBIN A1C: Hgb A1c MFr Bld: 8.2 % — ABNORMAL HIGH (ref 4.6–6.5)

## 2010-07-23 ENCOUNTER — Other Ambulatory Visit: Payer: BC Managed Care – PPO

## 2010-07-25 ENCOUNTER — Encounter: Payer: Self-pay | Admitting: Family Medicine

## 2010-07-29 ENCOUNTER — Ambulatory Visit (INDEPENDENT_AMBULATORY_CARE_PROVIDER_SITE_OTHER): Payer: BC Managed Care – PPO | Admitting: Family Medicine

## 2010-07-29 ENCOUNTER — Encounter: Payer: Self-pay | Admitting: Family Medicine

## 2010-07-29 DIAGNOSIS — I10 Essential (primary) hypertension: Secondary | ICD-10-CM

## 2010-07-29 DIAGNOSIS — E785 Hyperlipidemia, unspecified: Secondary | ICD-10-CM

## 2010-07-29 DIAGNOSIS — E119 Type 2 diabetes mellitus without complications: Secondary | ICD-10-CM

## 2010-07-29 DIAGNOSIS — J309 Allergic rhinitis, unspecified: Secondary | ICD-10-CM

## 2010-07-29 DIAGNOSIS — J3489 Other specified disorders of nose and nasal sinuses: Secondary | ICD-10-CM

## 2010-07-29 DIAGNOSIS — S335XXA Sprain of ligaments of lumbar spine, initial encounter: Secondary | ICD-10-CM

## 2010-07-29 MED ORDER — LISINOPRIL 20 MG PO TABS: 20.0000 mg | ORAL_TABLET | Freq: Every day | ORAL | Status: DC

## 2010-07-29 MED ORDER — METHOCARBAMOL 500 MG PO TABS: 500.0000 mg | ORAL_TABLET | Freq: Three times a day (TID) | ORAL | Status: DC

## 2010-07-29 MED ORDER — GLUCOSE BLOOD VI STRP: ORAL_STRIP | Status: DC

## 2010-07-29 MED ORDER — MUPIROCIN 2 % EX OINT: TOPICAL_OINTMENT | Freq: Three times a day (TID) | CUTANEOUS | Status: AC

## 2010-07-29 MED ORDER — VALACYCLOVIR HCL 500 MG PO TABS: 500.0000 mg | ORAL_TABLET | Freq: Every day | ORAL | Status: AC

## 2010-07-29 NOTE — Assessment & Plan Note (Signed)
LDL not at goal. Take pravachol daily. Discussed pt concerns about this med. Info on low chol diet given.  Recheck in 3 months.

## 2010-07-29 NOTE — Assessment & Plan Note (Signed)
MSK strain. Treat with muscle relaxant, NSAIDs prn. Start gentle stretching and massage therapy.

## 2010-07-29 NOTE — Assessment & Plan Note (Signed)
No clear lesion seen. Treat presumed nasal ulcer  With mupirocin gel to help with possible bacterial infeciton.

## 2010-07-29 NOTE — Assessment & Plan Note (Signed)
Zyrtec at bedtime for trial. Hold decongestants given SE and should not use with BP issues.

## 2010-07-29 NOTE — Assessment & Plan Note (Signed)
A1C shows poor control. Home FBS well controlled recently.  Continue current meds. Work on [employer]. Check 2 hour post prandials to make sure that is not the issue.  Recheck in 3 months.

## 2010-07-29 NOTE — Patient Instructions (Addendum)
For low back pain: Use muscle relaxant. Start massage and gentle stretching.   Keep a record of blood sugars and bring to next appt.  Check both fasting and 2 hours  After meals.  Work on low car and low cholesterol diet. Work on taking cholesterol medicine more regularly.  Trail of zyrtec for nasal congestion.  For scab in nose.. Use mupirocin gel.

## 2010-07-29 NOTE — Progress Notes (Signed)
Subjective:    Patient ID: Jean Robinson, female    DOB: April 03, 1955, 55 y.o.   MRN: [id number]  HPI  Diabetes: Worsened control since last check in 2011. On metformin max, glucotrol max Using medications without difficulties:Yes.  Has lost weight 3 lbs. Hypoglycemic episodes: Hyperglycemic episodes: Feet problems: Blood Sugars averaging: She has been under alot of stress in last 3 months, FBS sugars earlier in 3 months higher... FBS 80-90 , occ checking later in day 180 eye exam within last year:  Hypertension:  Well controlled on lisinopril  Using medication without problems or lightheadedness:  Chest pain with exertion:None Edema:None Short of breath:None Average home BPs: Not checking because meter broken.  Other issues:  Left low back pain 1 month ago, no shooting pain, no numbness, no weakness. Took some old methocarbamol. Improved with massage.  In last few days it has come back at much lower amount. She is interested in methocarbamol prescription.    Elevated Cholesterol: Not at goal LDL <100 on pravachol 40, fish oil.  Does miss taking pravastain a lot. Using medications without problems: Muscle aches: None Other complaints:  Has noted scab in left nostril x 2 week. Has applied neosporin.  Not healing as fast as she thinks it should. No known injury.   Has noticed decrease sense of smell. Occ using allergy medicine... But does have chronic congestion. Flonase in past burns nose. Review of Systems  Constitutional: Negative for fever and fatigue.  HENT: Positive for congestion. Negative for ear pain, rhinorrhea, sneezing, postnasal drip and tinnitus.        Scab in left nasal passage  Eyes: Negative for pain.  Genitourinary: Negative for dysuria.  Musculoskeletal: Positive for back pain.       Objective:   Physical Exam  Constitutional: Vital signs are normal. She appears well-developed and well-nourished. She is cooperative.  Non-toxic appearance. She does  not appear ill. No distress.  HENT:  Head: Normocephalic.  Right Ear: Hearing, tympanic membrane, external ear and ear canal normal. Tympanic membrane is not erythematous, not retracted and not bulging.  Left Ear: Hearing, tympanic membrane, external ear and ear canal normal. Tympanic membrane is not erythematous, not retracted and not bulging.  Nose: Mucosal edema present. No rhinorrhea. Right sinus exhibits no maxillary sinus tenderness and no frontal sinus tenderness. Left sinus exhibits no maxillary sinus tenderness and no frontal sinus tenderness.  Mouth/Throat: Uvula is midline, oropharynx is clear and moist and mucous membranes are normal.       Pale turbinates B.  flacky substance in left anterior nasal fold, no ulcer or scab seen, area slightly red. No abcess   Eyes: Conjunctivae, EOM and lids are normal. Pupils are equal, round, and reactive to light. No foreign bodies found.  Neck: Trachea normal and normal range of motion. Neck supple. Carotid bruit is not present. No mass and no thyromegaly present.  Cardiovascular: Normal rate, regular rhythm, S1 normal, S2 normal, normal heart sounds, intact distal pulses and normal pulses.  Exam reveals no gallop and no friction rub.   No murmur heard. Pulmonary/Chest: Effort normal and breath sounds normal. Not tachypneic. No respiratory distress. She has no decreased breath sounds. She has no wheezes. She has no rhonchi. She has no rales.  Abdominal: Soft. Normal appearance and bowel sounds are normal. There is no tenderness.  Lymphadenopathy:       Head (right side): No submental, no submandibular, no tonsillar, no preauricular, no posterior auricular and no occipital adenopathy present.  Head (left side): No submental, no submandibular, no tonsillar, no preauricular, no posterior auricular and no occipital adenopathy present.    She has no cervical adenopathy.  Neurological: She is alert.  Skin: Skin is warm, dry and intact. No rash  noted.  Psychiatric: Her speech is normal and behavior is normal. Judgment and thought content normal. Her mood appears not anxious. Cognition and memory are normal. She does not exhibit a depressed mood.          Assessment & Plan:

## 2010-07-29 NOTE — Assessment & Plan Note (Signed)
Well controlled. Continue current medication.  

## 2010-08-01 ENCOUNTER — Other Ambulatory Visit: Payer: Self-pay | Admitting: *Deleted

## 2010-08-01 MED ORDER — PRAVASTATIN SODIUM 40 MG PO TABS: 40.0000 mg | ORAL_TABLET | Freq: Every day | ORAL | Status: DC

## 2010-10-03 ENCOUNTER — Ambulatory Visit (HOSPITAL_COMMUNITY): Payer: BC Managed Care – PPO

## 2010-10-07 ENCOUNTER — Ambulatory Visit (HOSPITAL_COMMUNITY)
Admission: RE | Admit: 2010-10-07 | Discharge: 2010-10-07 | Disposition: A | Discharge status: Home / Self Care | Payer: BC Managed Care – PPO | Source: Ambulatory Visit | Attending: Obstetrics and Gynecology | Admitting: Obstetrics and Gynecology

## 2010-10-07 DIAGNOSIS — Z1231 Encounter for screening mammogram for malignant neoplasm of breast: Secondary | ICD-10-CM | POA: Insufficient documentation

## 2010-10-31 LAB — COMPREHENSIVE METABOLIC PANEL
ALT: 37 — ABNORMAL HIGH
AST: 23
CO2: 28
Calcium: 10.1
Chloride: 105
Creatinine, Ser: 0.52
GFR calc non Af Amer: >60
Glucose, Bld: 194 — ABNORMAL HIGH
Total Bilirubin: 1

## 2010-10-31 LAB — DIFFERENTIAL
Basophils Absolute: 0
Eosinophils Absolute: 0.1
Eosinophils Relative: 2
Lymphocytes Relative: 43
Lymphs Abs: 2.7
Neutrophils Relative %: 46

## 2010-10-31 LAB — CBC
HCT: 40.4
Hemoglobin: 13.9
MCHC: 34.4
MCV: 94.4
RBC: 4.28
WBC: 6.3

## 2010-10-31 LAB — POCT HEMOGLOBIN-HEMACUE: Hemoglobin: 13.4

## 2010-12-31 ENCOUNTER — Other Ambulatory Visit: Payer: Self-pay | Admitting: *Deleted

## 2011-01-01 MED ORDER — METHOCARBAMOL 500 MG PO TABS: 500.0000 mg | ORAL_TABLET | Freq: Three times a day (TID) | ORAL | Status: DC

## 2011-01-02 NOTE — Telephone Encounter (Signed)
rx called to pharmacy 

## 2011-01-07 ENCOUNTER — Ambulatory Visit (INDEPENDENT_AMBULATORY_CARE_PROVIDER_SITE_OTHER): Payer: BC Managed Care – PPO | Admitting: Family Medicine

## 2011-01-07 ENCOUNTER — Encounter: Payer: Self-pay | Admitting: Family Medicine

## 2011-01-07 VITALS — BP 120/72 | HR 90 | Temp 98.0°F | Ht 67.5 in | Wt 176.1 lb

## 2011-01-07 DIAGNOSIS — G2589 Other specified extrapyramidal and movement disorders: Secondary | ICD-10-CM

## 2011-01-07 DIAGNOSIS — S43499A Other sprain of unspecified shoulder joint, initial encounter: Secondary | ICD-10-CM

## 2011-01-07 DIAGNOSIS — S46819A Strain of other muscles, fascia and tendons at shoulder and upper arm level, unspecified arm, initial encounter: Secondary | ICD-10-CM

## 2011-01-07 DIAGNOSIS — R279 Unspecified lack of coordination: Secondary | ICD-10-CM

## 2011-01-07 DIAGNOSIS — M7710 Lateral epicondylitis, unspecified elbow: Secondary | ICD-10-CM

## 2011-01-07 DIAGNOSIS — M7711 Lateral epicondylitis, right elbow: Secondary | ICD-10-CM

## 2011-01-07 MED ORDER — TRAMADOL HCL 50 MG PO TABS: 50.0000 mg | ORAL_TABLET | Freq: Four times a day (QID) | ORAL | Status: AC | PRN

## 2011-01-07 MED ORDER — NITROGLYCERIN 0.2 MG/HR TD PT24: MEDICATED_PATCH | TRANSDERMAL | Status: DC

## 2011-01-07 NOTE — Progress Notes (Signed)
Patient presents with lateral elbow pain, shoulder pain, and mid back/shoulder blade pain.  Length of symptoms:intermittently, the patient has had RIGHT elbow pain for 2 years, and it is flared up over the last 2-3 weeks, with the worst pain she has ever had, and she additionally is having this posterior shoulder blade pain that has been ongoing for last 2-3 weeks as well. Hand effected:RIGHT  Patient describes a dull ache on the lateral elbow. There is some translation in the proximal forearm and in the distal upper arm. It is painful to lift with the hand facing down and to lift with the thumb in an upright position. Supination is painful. Patient points to the lateral epicondyle as the point of maximal tenderness near ECRB.she is also having some pain in the distal aspect of the upper arm as well as down into the forearm.  She also complains of significant pain in her trapezius and rhomboid region. On the RIGHT. She is working [employer] and is doing a lot of repetitive motion activities.  No trauma.   No prior fractures or operative interventions in the effective hand. Prior PT or HEP: none She has done some massage therapy, which seemed to help her posterior shoulder. She does not describe any significant pain with abduction.  Denies numbness or tingling. No significant neck or shoulder pain.  Hand of dominance: R  REVIEW OF SYSTEMS  GEN: No fevers, chills. Nontoxic. Primarily MSK c/o today. MSK: Detailed in the HPI GI: tolerating PO intake without difficulty Neuro: No numbness, parasthesias, or tingling associated. Otherwise the pertinent positives of the ROS are noted above.   PHYSICAL EXAM  Blood pressure 120/72, pulse 90, temperature 98 F (36.7 C), temperature source Oral, height 5' 7.5" (1.715 m), weight 176 lb 1.9 oz (79.888 kg), SpO2 99.00%.  GEN: Well-developed,well-nourished,in no acute distress; alert,appropriate and cooperative throughout examination HEENT:  Normocephalic and atraumatic without obvious abnormalities. Ears, externally no deformities PULM: Breathing comfortably in no respiratory distress EXT: No clubbing, cyanosis, or edema PSYCH: Normally interactive. Cooperative during the interview. Pleasant. Friendly and conversant. Not anxious or depressed appearing. Normal, full affect.  Cervical spine: Full range of motion without problems. Nontender throughout. Negative Spurling's examination. C5-T1 are intact throughout.  Shoulder: Full range of motion. Nontender at the a.c. Joint. Nontender along clavicle. Mildly tender in the bicipital groove. Nontender with a.c. Crossover test. Negative Neer test. Negative Hawkins test. Strength testing is 5/5 throughout and provokes the pain.  Scapular dyskinesis is noted and the patient does have 4 sloping shoulders. Most tenderness is around in the rhomboid and lower trapezius region in the parascapular border on the RIGHT.  R elbow Ecchymosis or edema: neg ROM: full flexion, extension, pronation, supination Shoulder ROM: Full Flexion: 5/5 Extension: 5/5, PAINFUL Supination: 5/5, PAINFUL Pronation: 5/5 Wrist ext: 5/5 Wrist flexion: 5/5 No gross bony abnormality Varus and Valgus stress: stable ECRB tenderness: YES, TTP Medial epicondyle: NT Lateral epicondyle, resisted wrist extension from wrist full pronation and flexion: PAINFUL grip: 5/5  sensation intact Tinel's, Elbow: negative  A/P: 1. Lateral epicondylitis of right elbow  nitroGLYCERIN (NITRODUR - DOSED IN MG/24 HR) 0.2 mg/hr, traMADol (ULTRAM) 50 MG tablet, Ambulatory referral to Physical Therapy  2. Trapezius muscle strain  nitroGLYCERIN (NITRODUR - DOSED IN MG/24 HR) 0.2 mg/hr, traMADol (ULTRAM) 50 MG tablet, Ambulatory referral to Physical Therapy  3. Scapular dyskinesis  nitroGLYCERIN (NITRODUR - DOSED IN MG/24 HR) 0.2 mg/hr, traMADol (ULTRAM) 50 MG tablet, Ambulatory referral to Physical Therapy  Reviewed lifting  technique. The patient has a tennis elbow strap. Reviewed elbow and scapular stabilization.  Formal physical therapy for recalcitrant tennis elbow and assistance with training for scapular dyskinesis and shoulder proprioception. Standard nitroglycerin protocol, one quarter to affected area around the ECRB

## 2011-01-07 NOTE — Patient Instructions (Signed)
REFERRAL: GO THE THE FRONT ROOM AT THE ENTRANCE OF OUR CLINIC, NEAR CHECK IN. ASK FOR Jean Robinson. SHE WILL HELP YOU SET UP YOUR REFERRAL. DATE: TIME:   Recheck 6 weeks 

## 2011-01-26 ENCOUNTER — Other Ambulatory Visit: Payer: Self-pay | Admitting: Internal Medicine

## 2011-01-26 MED ORDER — METHOCARBAMOL 500 MG PO TABS: 500.0000 mg | ORAL_TABLET | Freq: Three times a day (TID) | ORAL | Status: DC

## 2011-01-28 ENCOUNTER — Other Ambulatory Visit: Payer: Self-pay | Admitting: *Deleted

## 2011-01-28 MED ORDER — METHOCARBAMOL 500 MG PO TABS: 500.0000 mg | ORAL_TABLET | Freq: Three times a day (TID) | ORAL | Status: DC

## 2011-03-19 ENCOUNTER — Encounter: Payer: Self-pay | Admitting: Family Medicine

## 2011-03-19 ENCOUNTER — Ambulatory Visit (INDEPENDENT_AMBULATORY_CARE_PROVIDER_SITE_OTHER): Payer: BC Managed Care – PPO | Admitting: Family Medicine

## 2011-03-19 DIAGNOSIS — H6980 Other specified disorders of Eustachian tube, unspecified ear: Secondary | ICD-10-CM

## 2011-03-19 MED ORDER — FLUTICASONE PROPIONATE 50 MCG/ACT NA SUSP: 2.0000 | Freq: Every day | NASAL | Status: DC

## 2011-03-19 NOTE — Patient Instructions (Signed)
I would use the flonase- 2 sprays in each nostril.  If you are not getting better, then we can set you up with ENT.  Take care.

## 2011-03-19 NOTE — Progress Notes (Signed)
She is chronically stuffy but the last 2-3 days are worse than normal.  Using neti pot but having trouble with this as she is very congested.  Some dry cough but nasal stuffiness is the main issue.  Some postnasal gtt.  No fevers. Minimal aches.  No pressure in ears; this is some better today than yesterday.  She's sleeping sitting up.  Sugar has been controlled, ~100 in AM.    Meds, vitals, and allergies reviewed.   ROS: See HPI.  Otherwise, noncontributory.  GEN: nad, alert and oriented HEENT: mucous membranes moist, tm w/o erythema, nasal exam w/o erythema, clear discharge noted,  OP with cobblestoning, poor TM movement on valsalva x2 NECK: supple w/o LA CV: rrr.   PULM: ctab, no inc wob EXT: no edema SKIN: no acute rash

## 2011-03-22 DIAGNOSIS — H6980 Other specified disorders of Eustachian tube, unspecified ear: Secondary | ICD-10-CM | POA: Insufficient documentation

## 2011-03-22 DIAGNOSIS — H6990 Unspecified Eustachian tube disorder, unspecified ear: Secondary | ICD-10-CM | POA: Insufficient documentation

## 2011-03-22 NOTE — Assessment & Plan Note (Signed)
Anatomy d/w pt.  Start flonase and if not improved, consider ENT eval.  She agrees.

## 2011-04-03 ENCOUNTER — Telehealth: Payer: Self-pay | Admitting: Family Medicine

## 2011-04-03 ENCOUNTER — Other Ambulatory Visit: Payer: Self-pay | Admitting: Family Medicine

## 2011-04-03 NOTE — Telephone Encounter (Signed)
Triage Record Num: [id number] Operator: Chevis Pretty Patient Name: Jean Robinson Call Date & Time: 04/03/2011 3:53:28PM Patient Phone: [phone] PCP: Patient Gender: Female PCP Fax : Patient DOB: 1955-10-15 Practice Name: [facility] Reason for Call: Caller: Jean Robinson/Patient; PCP: Excell Seltzer.; CB#: [phone]; ; ; Call regarding Seen 03/27/11 for URI; Now Has Sinus Pain and Pressure and Earache; states working the weekend and cannot go to [place] office 04/04/11. Afebrile today, but did have fever 04/02/11. States has not been able to sleep due to sinus pain. Per protocol, emergent symptoms denied; advised being seen within 24 hours. Patient requests Rx be [facility] in [facility]. Info to office for MD review/Rx/callback. MAY REACH PATIENT AT [phone]. Protocol(s) Used: Upper Respiratory Infection (URI) Recommended Outcome per Protocol: See Provider within 24 hours Reason for Outcome: Facial pain (fullness, pressure, worsens with bending over), frontal headache, yellow-green nasal discharge AND any temperature elevation Care Advice: ~ Use a cool mist humidifier to moisten air. Be sure to clean according to manufacturer's instructions. ~ See provider in 8 hours if redness, swelling and tenderness develops above or below eyes/near ears/over sinuses ~ Consider use of a saline nasal spray per package directions to help relieve nasal congestion. ~ SYMPTOM / CONDITION MANAGEMENT ~ CAUTIONS A warm, moist compress placed on face, over eyes for 15 to 20 minutes, 5 to 6 times a day, may help relieve the congestion. ~ Consider nonprescription decongestant (Sudafed, Drixoral) for relief of symptoms after checking with a provider, especially if there is a history of hypertension, hyperthyroidism, heart disease, diabetes, glaucoma, urinary retention caused by prostatic hypertrophy. ~ Analgesic/Antipyretic Advice - Acetaminophen: Consider acetaminophen as directed on label or by  pharmacist/provider for pain or fever PRECAUTIONS: - Use if there is no history of liver disease, alcoholism, or intake of three or more alcohol drinks per day - Only if approved by provider during pregnancy or when breastfeeding - During pregnancy, acetaminophen should not be taken more than 3 consecutive days without telling provider - Do not exceed recommended dose or frequency ~ If pregnancy is known or suspected, get advice from provider before using any nonprescription medication other than acetaminophen ~ Analgesic/Antipyretic Advice - NSAIDs: Consider aspirin, ibuprofen, naproxen or ketoprofen for pain or fever as directed on label or by pharmacist/provider. PRECAUTIONS: - If over 73 years of age, should not take longer than 1 week without consulting provider. EXCEPTIONS: - Should not be used if taking blood thinners or have bleeding problems. - Do not use if have history of sensitivity/allergy to any of these medications; or history of cardiovascular, ulcer, kidney, liver disease or diabetes unless approved by provider. - Do not exceed recommended dose or frequency. ~ 04/03/2011 3:59:46PM Page 1 of 2 [email or web address] [facility] Triage Call Report Patient Name: Jean Robinson continuation page/s Nasal Irrigation To Relieve Congestion: - Wash hands with soap and water. - Add 1/2 teaspoon salt to 1 cup warm water to make saltwater solution. - Use a bulb syringe to draw up the saltwater solution. Turn the bulb upright to squeeze out any air. Fill the syringe until is full. - Bend over sink bowl and lean slightly toward sink. Gently insert the tip of the syringe into nostril about 1/2 inch. Point tip toward outer corner of eye and GENTLY squeeze bulb to squirt saltwater into nostril. Forceful irrigation to clear sinuses requires the use of sterile water or saline. - Let solution drain from nose. Solution may come out of the other nostril or even  your mouth. Do two full syringes  of solution in each nostril. - Rinse syringe in clean water several times. Be sure water comes out clear. Dry the bulb syringe and store in a container or plastic bag. ~ 04/03/2011 3:59:46PM Page 2 of 2 [email or web address]_[email or web address]_[email or web address]

## 2011-04-04 NOTE — Telephone Encounter (Signed)
Has this been addressed?

## 2011-04-06 ENCOUNTER — Telehealth: Payer: Self-pay | Admitting: Family Medicine

## 2011-04-06 NOTE — Telephone Encounter (Signed)
No I dont think it has

## 2011-04-06 NOTE — Telephone Encounter (Signed)
Triage Record Num: [id number] Operator: Alphonsa Overall Patient Name: Jean Robinson Call Date & Time: 04/03/2011 6:23:21PM Patient Phone: [phone] PCP: Patient Gender: Female PCP Fax : Patient DOB: 10-11-1955 Practice Name: [facility] Reason for Call: Caller: Alejandra/Patient; PCP: Excell Seltzer.; CB#: [phone]; Call regarding Medication Issue; Medication(s): abx not called in [name] calling about sinus infection. Ongoing x 2 weeks. Pt worsening with congestion. Low grade fever 04/02/11. Ear pain/popping. Pt triage note completed and sent earlier 04/03/11 to Dr Ermalene Searing who is on maternity leave. Dr Sharen Hones paged and ordered Augmentin 875mg  1 po BID x 10days. #QS. Called into [facility] [address] [phone]. Pt aware and will continue plan. Protocol(s) Used: Office Note Recommended Outcome per Protocol: Information Noted and Sent to Office Reason for Outcome: Caller information to office Care Advice: ~ 04/03/2011 6:34:08PM Page 1 of 1 [email or web address]

## 2011-04-06 NOTE — Telephone Encounter (Signed)
Second triage note states that Dr. Reece Agar sent in Augmentin today.

## 2011-05-25 ENCOUNTER — Other Ambulatory Visit: Payer: Self-pay | Admitting: *Deleted

## 2011-05-25 MED ORDER — PRAVASTATIN SODIUM 40 MG PO TABS: 40.0000 mg | ORAL_TABLET | Freq: Every day | ORAL | Status: DC

## 2011-05-25 MED ORDER — METFORMIN HCL ER 500 MG PO TB24: 1000.0000 mg | ORAL_TABLET | Freq: Two times a day (BID) | ORAL | Status: DC

## 2011-05-25 NOTE — Telephone Encounter (Signed)
Addended by: Consuello Masse on: 05/25/2011 04:26 PM   Modules accepted: Orders

## 2011-05-25 NOTE — Telephone Encounter (Signed)
Received faxed refill request from pharmacy. Refills sent to pharmacy electronically. 

## 2011-06-01 ENCOUNTER — Ambulatory Visit (INDEPENDENT_AMBULATORY_CARE_PROVIDER_SITE_OTHER): Payer: BC Managed Care – PPO | Admitting: Family Medicine

## 2011-06-01 ENCOUNTER — Encounter: Payer: Self-pay | Admitting: Family Medicine

## 2011-06-01 VITALS — BP 120/70 | HR 83 | Temp 98.0°F | Ht 67.5 in | Wt 171.0 lb

## 2011-06-01 DIAGNOSIS — M79609 Pain in unspecified limb: Secondary | ICD-10-CM

## 2011-06-01 DIAGNOSIS — M79672 Pain in left foot: Secondary | ICD-10-CM

## 2011-06-01 MED ORDER — GLUCOSE BLOOD VI STRP: ORAL_STRIP | Status: DC

## 2011-06-01 NOTE — Progress Notes (Signed)
Patient Name: Jean Robinson Date of Birth: 1956/01/14 Age: 56 y.o. Medical Record Number: [id number] Gender: female Date of Encounter: 06/01/2011  History of Present Illness:  Jean Robinson is a 56 y.o. very pleasant female patient who presents with the following:  Pleasant patient who is on her feet most of the day, working for [employer] who presents with left-sided foot pain and an acute palpable  Elevation is tender to palpation in the proximal aspect of the 4th metatarsal. She has no trauma or accident she can recall. It is been hurting very significantly for at least the last few days.  She does have intermittent foot pain off and on and has had this for a long time. She has had some pain in that region previously over the last month or so. This is acutely worsened in the last few days.  Past Medical History, Surgical History, Social History, Family History, Problem List, Medications, and Allergies have been reviewed and updated if relevant.  Review of Systems:  GEN: No fevers, chills. Nontoxic. Primarily MSK c/o today. MSK: Detailed in the HPI GI: tolerating PO intake without difficulty Neuro: No numbness, parasthesias, or tingling associated. Otherwise the pertinent positives of the ROS are noted above.    Physical Examination: Filed Vitals:   06/01/11 1038  BP: 120/70  Pulse: 83  Temp: 98 F (36.7 C)  TempSrc: Oral  Height: 5' 7.5" (1.715 m)  Weight: 171 lb (77.565 kg)  SpO2: 98%    Body mass index is 26.39 kg/(m^2).   GEN: WDWN, NAD, Non-toxic, Alert & Oriented x 3 HEENT: Atraumatic, Normocephalic.  Ears and Nose: No external deformity. EXTR: No clubbing/cyanosis/edema NEURO: Normal gait, mildly antalgic PSYCH: Normally interactive. Conversant. Not depressed or anxious appearing.  Calm demeanor.   LEFT foot: Palpable elevation in the proximal aspect of the 4th metatarsal near the tarsometatarsal joint. Nontender along the 5th metatarsal. 1st through 3rd metatarsals  are nontender. The foot is nontender. Nontender Achilles. Nontender plantar fascia. Nontender malleoli. Stable drawer examination.  Diagnostic Ultrasound Evaluation Terason t3000, MSK ultrasound, MSK probe Anatomy scanned: Limited, LEFT anterior foot, dorsal Indication: Pain Findings: LEFT proximal metatarsal scan in longitudinal and transverse views. At the proximal aspect of the 4th metatarsal there is a well visualized hypoechoic surrounding structure to the bone without distinct borders. There is some calcification or hyperechoic pattern as well in the anterior aspect.. On color flow Doppler, there is some Doppler activity. There appears to be no cortical disruption of bone. TMT joint appears normal and surrounding tarsal bones are normal. No elevation of surrounding tarsal bones.   Assessment and Plan:  1. Left foot pain    By ultrasonography, this most has the appearance of some calcification with surrounding swelling at proximal 4th, which could be commonly seen with some stress fracture or stress reaction that is healing. There is also some new blood vessel formation. Other abnormalities cannot be excluded, and we will follow this clinically  Given that it is clinically tender, I'm going to place the patient in a postoperative shoe for the next 3-4 weeks, and recheck her. Also given her some forefoot and midfoot compression.    Orders Today: No orders of the defined types were placed in this encounter.    Medications Today: Meds ordered this encounter  Medications  . glucose blood (ONE TOUCH TEST STRIPS) test strip    Sig: Use as instructed    Dispense:  100 each    Refill:  12

## 2011-06-01 NOTE — Patient Instructions (Addendum)
Recheck in 3-4 weeks

## 2011-06-24 ENCOUNTER — Other Ambulatory Visit: Payer: Self-pay

## 2011-06-24 ENCOUNTER — Other Ambulatory Visit: Payer: Self-pay | Admitting: *Deleted

## 2011-06-24 MED ORDER — LISINOPRIL 20 MG PO TABS: 20.0000 mg | ORAL_TABLET | Freq: Every day | ORAL | Status: DC

## 2011-06-24 MED ORDER — GLIPIZIDE ER 10 MG PO TB24: 10.0000 mg | ORAL_TABLET | Freq: Two times a day (BID) | ORAL | Status: DC

## 2011-06-24 NOTE — Telephone Encounter (Signed)
Pt request refill Lisinopril. Pt to call for f/u appt tomorrow after receive work schedule. Med sent to [facility].

## 2011-07-20 ENCOUNTER — Other Ambulatory Visit (INDEPENDENT_AMBULATORY_CARE_PROVIDER_SITE_OTHER): Payer: BC Managed Care – PPO

## 2011-07-20 DIAGNOSIS — E785 Hyperlipidemia, unspecified: Secondary | ICD-10-CM

## 2011-07-20 DIAGNOSIS — E119 Type 2 diabetes mellitus without complications: Secondary | ICD-10-CM

## 2011-07-20 LAB — HEMOGLOBIN A1C: Hgb A1c MFr Bld: 8.7 % — ABNORMAL HIGH (ref 4.6–6.5)

## 2011-07-20 LAB — LIPID PANEL
Cholesterol: 125 mg/dL (ref 0–200)
VLDL: 18.6 mg/dL (ref 0.0–40.0)

## 2011-07-22 ENCOUNTER — Other Ambulatory Visit: Payer: Self-pay | Admitting: *Deleted

## 2011-07-22 MED ORDER — LISINOPRIL 20 MG PO TABS: 20.0000 mg | ORAL_TABLET | Freq: Every day | ORAL | Status: DC

## 2011-07-30 ENCOUNTER — Other Ambulatory Visit: Payer: Self-pay | Admitting: Family Medicine

## 2011-08-19 ENCOUNTER — Ambulatory Visit (INDEPENDENT_AMBULATORY_CARE_PROVIDER_SITE_OTHER): Payer: BC Managed Care – PPO | Admitting: Family Medicine

## 2011-08-19 ENCOUNTER — Encounter: Payer: Self-pay | Admitting: Family Medicine

## 2011-08-19 VITALS — BP 130/70 | HR 95 | Temp 98.7°F | Ht 67.5 in | Wt 170.8 lb

## 2011-08-19 DIAGNOSIS — R0789 Other chest pain: Secondary | ICD-10-CM

## 2011-08-19 DIAGNOSIS — F329 Major depressive disorder, single episode, unspecified: Secondary | ICD-10-CM

## 2011-08-19 DIAGNOSIS — R079 Chest pain, unspecified: Secondary | ICD-10-CM

## 2011-08-19 DIAGNOSIS — I10 Essential (primary) hypertension: Secondary | ICD-10-CM

## 2011-08-19 DIAGNOSIS — F3289 Other specified depressive episodes: Secondary | ICD-10-CM

## 2011-08-19 DIAGNOSIS — E119 Type 2 diabetes mellitus without complications: Secondary | ICD-10-CM

## 2011-08-19 DIAGNOSIS — E785 Hyperlipidemia, unspecified: Secondary | ICD-10-CM

## 2011-08-19 NOTE — Progress Notes (Signed)
Subjective:    Patient ID: Jean Robinson, female    DOB: 1955-11-08, 56 y.o.   MRN: [id number]  HPI  Diabetes:  Poor control on metformin 1000 mg daily and glipizide. Lab Results  Component Value Date   HGBA1C 8.7* 07/20/2011  Using medications without difficulties:Yes.  Hypoglycemic episodes: None Hyperglycemic episodes:  yes Feet problems: right foot after been on feet for a while.. Across metatarsals. Has tried not using meloxicam but still has Blood Sugars averaging: FBS 97-135, 2 hours after a meal 226 eye exam within last year:  Sh had issues with [place].. SE in past.  Has been under a lot of stress lately.  Hypertension: Well controlled on lisinopril  Using medication without problems or lightheadedness:  Chest pain with exertion:  None, occ chest pain 2 times in last year, lasted few minutes, changed position not sure if helped much, no relationship to food.. Twinge, some pain in neck on right when under stress.  Edema:None  Short of breath:None  Average home BPs: Not checking because meter broken.  Other issues:   Elevated Cholesterol: Not now at goal LDL <100 on pravachol 40, fish oil.  Lab Results  Component Value Date   CHOL 125 07/20/2011   HDL 50.30 07/20/2011   LDLCALC 56 07/20/2011   TRIG 93.0 07/20/2011   CHOLHDL 2 07/20/2011  Compliant now with medication. Using medications without problems:  Muscle aches: None  Other complaints:  Depression, some insomnia: Increase stress with sons issues. Moderate control.. Considering going back on zoloft.  No SI, no HI. Has started seeing a counselor.  Review of Systems  All other systems reviewed and are negative.       Objective:   Physical Exam  Constitutional: Vital signs are normal. She appears well-developed and well-nourished. She is cooperative.  Non-toxic appearance. She does not appear ill. No distress.  HENT:  Head: Normocephalic.  Right Ear: Hearing, tympanic membrane, external ear and ear canal normal.  Tympanic membrane is not erythematous, not retracted and not bulging.  Left Ear: Hearing, tympanic membrane, external ear and ear canal normal. Tympanic membrane is not erythematous, not retracted and not bulging.  Nose: No mucosal edema or rhinorrhea. Right sinus exhibits no maxillary sinus tenderness and no frontal sinus tenderness. Left sinus exhibits no maxillary sinus tenderness and no frontal sinus tenderness.  Mouth/Throat: Uvula is midline, oropharynx is clear and moist and mucous membranes are normal.  Eyes: Conjunctivae, EOM and lids are normal. Pupils are equal, round, and reactive to light. No foreign bodies found.  Neck: Trachea normal and normal range of motion. Neck supple. Carotid bruit is not present. No mass and no thyromegaly present.  Cardiovascular: Normal rate, regular rhythm, S1 normal, S2 normal, normal heart sounds, intact distal pulses and normal pulses.  Exam reveals no gallop and no friction rub.   No murmur heard. Pulmonary/Chest: Effort normal and breath sounds normal. Not tachypneic. No respiratory distress. She has no decreased breath sounds. She has no wheezes. She has no rhonchi. She has no rales.  Abdominal: Soft. Normal appearance and bowel sounds are normal. There is no tenderness.  Neurological: She is alert.  Skin: Skin is warm, dry and intact. No rash noted.  Psychiatric: Her speech is normal and behavior is normal. Judgment and thought content normal. Her mood appears not anxious. Cognition and memory are normal. She does not exhibit a depressed mood.    Diabetic foot exam: Normal inspection No skin breakdown No calluses  Normal DP pulses  Normal sensation to light touch and monofilament Nails normal        Assessment & Plan:

## 2011-08-19 NOTE — Patient Instructions (Addendum)
Call if interested in restarting zoloft. Cut back on sweets. Increase exercise. Plan on doing [place] [facility]. If interested call for nutrition referral.  EKG normal.. If chest pain continuing call for referral for stress testing.

## 2011-08-26 ENCOUNTER — Other Ambulatory Visit: Payer: Self-pay | Admitting: Family Medicine

## 2011-08-26 MED ORDER — PRAVASTATIN SODIUM 40 MG PO TABS: 40.0000 mg | ORAL_TABLET | Freq: Every day | ORAL | Status: DC

## 2011-08-26 NOTE — Telephone Encounter (Signed)
Received faxed refill request from pharmacy. Refill sent to pharmacy electronically. 

## 2011-09-04 DIAGNOSIS — R0789 Other chest pain: Secondary | ICD-10-CM | POA: Insufficient documentation

## 2011-09-04 NOTE — Assessment & Plan Note (Signed)
Inadequate control.Pt refuses med change. Cut back on sweets. Increase exercise. [place] [facility]. If interested call for nutrition referral.

## 2011-09-04 NOTE — Assessment & Plan Note (Signed)
Well controlled. Continue current medication. On ACEI  

## 2011-09-04 NOTE — Assessment & Plan Note (Signed)
EKG nml. Given higher risk  With high chol, DM etc., if chest pain recurs.. Refer for stress test.

## 2011-09-04 NOTE — Assessment & Plan Note (Signed)
Not currently interested in restarting meds. Call if interested in restarting zoloft. Work on stress reduction, relaxation.

## 2011-09-04 NOTE — Assessment & Plan Note (Signed)
Now at goal on pravastatin.

## 2011-09-25 ENCOUNTER — Other Ambulatory Visit: Payer: Self-pay | Admitting: *Deleted

## 2011-09-25 MED ORDER — FLUTICASONE PROPIONATE 50 MCG/ACT NA SUSP: 2.0000 | Freq: Every day | NASAL | Status: DC

## 2011-09-28 ENCOUNTER — Other Ambulatory Visit: Payer: Self-pay | Admitting: *Deleted

## 2011-09-28 MED ORDER — LISINOPRIL 20 MG PO TABS: 20.0000 mg | ORAL_TABLET | Freq: Every day | ORAL | Status: DC

## 2011-10-21 ENCOUNTER — Ambulatory Visit (INDEPENDENT_AMBULATORY_CARE_PROVIDER_SITE_OTHER): Payer: BC Managed Care – PPO | Admitting: Family Medicine

## 2011-10-21 ENCOUNTER — Encounter: Payer: Self-pay | Admitting: Family Medicine

## 2011-10-21 VITALS — BP 124/84 | HR 68 | Temp 98.7°F | Wt 172.0 lb

## 2011-10-21 DIAGNOSIS — R21 Rash and other nonspecific skin eruption: Secondary | ICD-10-CM

## 2011-10-21 MED ORDER — TRIAMCINOLONE ACETONIDE 0.5 % EX CREA: TOPICAL_CREAM | Freq: Three times a day (TID) | CUTANEOUS | Status: DC

## 2011-10-21 NOTE — Assessment & Plan Note (Signed)
Unclear source, possible contact derm. Would use topical TAC and f/u prn.  She agrees.

## 2011-10-21 NOTE — Patient Instructions (Addendum)
Use the cream up to 3 times a day (only on the itchy area) and this should resolve.  Take care.

## 2011-10-21 NOTE — Progress Notes (Signed)
Rash on R arm.  3 days.  She doesn't do well with benadryl due to insomnia.  Itchy rash.  No lesions elsewhere (ie L arm, legs, trunk).  Not painful, doesn't burn.  Had been working in the garden but the rash predates this exposure.  No known sick exposures with similar.  No FCNAVD.  She is already taking zyrtec.    Meds, vitals, and allergies reviewed.   ROS: See HPI.  Otherwise, noncontributory.  nad ncat No rash except for 2 lesions on R arm- 1 on antecubital area = 13 cm max diameter, 1 on distal forearm = 3cm.  Blanching pink maculopapular area w/o ulceration or blisters.  No fluctuant mass.

## 2011-11-18 ENCOUNTER — Other Ambulatory Visit: Payer: Self-pay | Admitting: Obstetrics and Gynecology

## 2011-11-18 DIAGNOSIS — Z1231 Encounter for screening mammogram for malignant neoplasm of breast: Secondary | ICD-10-CM

## 2011-11-24 ENCOUNTER — Other Ambulatory Visit: Payer: Self-pay | Admitting: *Deleted

## 2011-11-24 MED ORDER — METFORMIN HCL ER 500 MG PO TB24: ORAL_TABLET | ORAL | Status: DC

## 2011-11-27 ENCOUNTER — Ambulatory Visit (HOSPITAL_COMMUNITY)
Admission: RE | Admit: 2011-11-27 | Discharge: 2011-11-27 | Disposition: A | Discharge status: Home / Self Care | Payer: BC Managed Care – PPO | Source: Ambulatory Visit | Attending: Obstetrics and Gynecology | Admitting: Obstetrics and Gynecology

## 2011-11-27 DIAGNOSIS — Z1231 Encounter for screening mammogram for malignant neoplasm of breast: Secondary | ICD-10-CM | POA: Insufficient documentation

## 2011-12-22 ENCOUNTER — Telehealth: Payer: Self-pay | Admitting: Family Medicine

## 2011-12-22 NOTE — Telephone Encounter (Signed)
Ms Dini called about urine sx's. No response when Rn returns call to mobile. Left message if still needing help to call back.

## 2012-02-09 LAB — HM PAP SMEAR: HM Pap smear: NEGATIVE

## 2012-02-16 ENCOUNTER — Ambulatory Visit: Payer: BC Managed Care – PPO | Admitting: Family Medicine

## 2012-03-31 LAB — HM DIABETES EYE EXAM

## 2012-05-23 ENCOUNTER — Telehealth: Payer: Self-pay | Admitting: *Deleted

## 2012-05-23 NOTE — Telephone Encounter (Signed)
Patient request refills on valacycolvir and mobic and neither of these medications are on her medication list is this okay?

## 2012-05-24 MED ORDER — VALACYCLOVIR HCL 500 MG PO TABS: 500.0000 mg | ORAL_TABLET | Freq: Every day | ORAL | Status: DC

## 2012-05-24 MED ORDER — MELOXICAM 15 MG PO TABS: 15.0000 mg | ORAL_TABLET | Freq: Every day | ORAL | Status: DC

## 2012-05-24 NOTE — Telephone Encounter (Signed)
OKay to refill. 

## 2012-05-27 ENCOUNTER — Other Ambulatory Visit: Payer: Self-pay | Admitting: *Deleted

## 2012-05-27 MED ORDER — FLUTICASONE PROPIONATE 50 MCG/ACT NA SUSP: 2.0000 | Freq: Every day | NASAL | Status: DC

## 2012-06-01 ENCOUNTER — Other Ambulatory Visit: Payer: Self-pay | Admitting: *Deleted

## 2012-06-01 MED ORDER — GLUCOSE BLOOD VI STRP: ORAL_STRIP | Status: DC

## 2012-07-05 ENCOUNTER — Other Ambulatory Visit: Payer: Self-pay | Admitting: *Deleted

## 2012-07-05 MED ORDER — GLIPIZIDE ER 10 MG PO TB24: 10.0000 mg | ORAL_TABLET | Freq: Two times a day (BID) | ORAL | Status: DC

## 2012-07-05 MED ORDER — PRAVASTATIN SODIUM 40 MG PO TABS: 40.0000 mg | ORAL_TABLET | Freq: Every day | ORAL | Status: DC

## 2012-07-05 MED ORDER — MELOXICAM 15 MG PO TABS: 15.0000 mg | ORAL_TABLET | Freq: Every day | ORAL | Status: DC

## 2012-07-22 ENCOUNTER — Other Ambulatory Visit: Payer: Self-pay

## 2012-07-22 MED ORDER — METFORMIN HCL ER 500 MG PO TB24: ORAL_TABLET | ORAL | Status: DC

## 2012-07-22 MED ORDER — LISINOPRIL 20 MG PO TABS: 20.0000 mg | ORAL_TABLET | Freq: Every day | ORAL | Status: DC

## 2012-07-22 NOTE — Telephone Encounter (Signed)
Pt request refill lisinopril, metformin to [facility] for 30 day supply until appt 07/29/12. Advised pt done.

## 2012-07-29 ENCOUNTER — Ambulatory Visit (INDEPENDENT_AMBULATORY_CARE_PROVIDER_SITE_OTHER): Payer: BC Managed Care – PPO | Admitting: Family Medicine

## 2012-07-29 ENCOUNTER — Ambulatory Visit (INDEPENDENT_AMBULATORY_CARE_PROVIDER_SITE_OTHER)
Admission: RE | Admit: 2012-07-29 | Discharge: 2012-07-29 | Disposition: A | Discharge status: Home / Self Care | Payer: BC Managed Care – PPO | Source: Ambulatory Visit | Attending: Family Medicine | Admitting: Family Medicine

## 2012-07-29 ENCOUNTER — Encounter: Payer: Self-pay | Admitting: Family Medicine

## 2012-07-29 VITALS — BP 120/74 | HR 82 | Temp 98.2°F | Ht 67.5 in | Wt 174.5 lb

## 2012-07-29 DIAGNOSIS — E785 Hyperlipidemia, unspecified: Secondary | ICD-10-CM

## 2012-07-29 DIAGNOSIS — M79672 Pain in left foot: Secondary | ICD-10-CM

## 2012-07-29 DIAGNOSIS — M79609 Pain in unspecified limb: Secondary | ICD-10-CM

## 2012-07-29 DIAGNOSIS — I10 Essential (primary) hypertension: Secondary | ICD-10-CM

## 2012-07-29 DIAGNOSIS — F3289 Other specified depressive episodes: Secondary | ICD-10-CM

## 2012-07-29 DIAGNOSIS — E119 Type 2 diabetes mellitus without complications: Secondary | ICD-10-CM

## 2012-07-29 DIAGNOSIS — F329 Major depressive disorder, single episode, unspecified: Secondary | ICD-10-CM

## 2012-07-29 LAB — HM DIABETES FOOT EXAM

## 2012-07-29 MED ORDER — SERTRALINE HCL 25 MG PO TABS: 25.0000 mg | ORAL_TABLET | Freq: Every day | ORAL | Status: DC

## 2012-07-29 NOTE — Assessment & Plan Note (Signed)
Will eval with X-ray to rule out fracture.  Wear supportive shoes, elevate left foot , ice.  No prolongued standing/walking.

## 2012-07-29 NOTE — Assessment & Plan Note (Addendum)
Likely poor control on max glipizide and metformin. Will eval with labs.  Referred for nutritionist. > 20 min of appt spent on counseling on diet and lifestyle.

## 2012-07-29 NOTE — Assessment & Plan Note (Signed)
Poor control.  Restart sertraline 25 mg daily. Follow up in 1 month.

## 2012-07-29 NOTE — Progress Notes (Signed)
57 year old female presents for follow up.. I last saw her for these issues 1 year ago!!!  Has CPX in 01/2012 at [facility].  She has been feeling more overwhelmed lately in last few months.  No panic attacks.  In past did well on sertraline 25 mg in past. She wishes to restart this. No depression, no SI.  Diabetes: Likely poor control   on metformin 1000 mg daily and glipizide.  Due for re-eval. Using medications without difficulties:Yes.  Hypoglycemic episodes: None  Hyperglycemic episodes: yes  Feet problems:  Shampoo bottle fell on top of foot 5 weeks ago, since then bruising and tenderness. Wearing tight tennis shoe for stability right now. eye exam within last year:  03/2012 She had issues with Byetta.. SE in past.   She is trying to eat a low carb diet as best she can.  No regular exercise.  Hypertension: Well controlled on lisinopril  Using medication without problems or lightheadedness:  Chest pain with exertion: None,  Edema:None  Short of breath:None  Average home BPs: Not checking because meter broken.  Other issues:   Elevated Cholesterol: Due for re-eval. Compliant now with medication.  Using medications without problems: None Muscle aches: None  Other complaints:   Depression, some insomnia: Increase stress with sons issues. Moderate control.. Considering going back on zoloft.  No SI, no HI. Has started seeing a counselor.   Review of Systems  All other systems reviewed and are negative.  Objective:   Physical Exam  Constitutional: Vital signs are normal. She appears well-developed and well-nourished. She is cooperative. Non-toxic appearance. She does not appear ill. No distress.  HENT:  Head: Normocephalic.  Right Ear: Hearing, tympanic membrane, external ear and ear canal normal. Tympanic membrane is not erythematous, not retracted and not bulging.  Left Ear: Hearing, tympanic membrane, external ear and ear canal normal. Tympanic membrane is not erythematous, not  retracted and not bulging.  Nose: No mucosal edema or rhinorrhea. Right sinus exhibits no maxillary sinus tenderness and no frontal sinus tenderness. Left sinus exhibits no maxillary sinus tenderness and no frontal sinus tenderness.  Mouth/Throat: Uvula is midline, oropharynx is clear and moist and mucous membranes are normal.  Eyes: Conjunctivae, EOM and lids are normal. Pupils are equal, round, and reactive to light. No foreign bodies found.  Neck: Trachea normal and normal range of motion. Neck supple. Carotid bruit is not present. No mass and no thyromegaly present.  Cardiovascular: Normal rate, regular rhythm, S1 normal, S2 normal, normal heart sounds, intact distal pulses and normal pulses. Exam reveals no gallop and no friction rub.  No murmur heard.  Pulmonary/Chest: Effort normal and breath sounds normal. Not tachypneic. No respiratory distress. She has no decreased breath sounds. She has no wheezes. She has no rhonchi. She has no rales.  Abdominal: Soft. Normal appearance and bowel sounds are normal. There is no tenderness.  Neurological: She is alert.  Skin: Skin is warm, dry and intact. No rash noted.  Psychiatric: Her speech is normal and behavior is normal. Judgment and thought content normal. Her mood appears not anxious. Cognition and memory are normal. She does not exhibit a depressed mood.   MSK: left foot: bruising tenderness and swelling over left lateral dorasal foot, otherwise nml ROM. Diabetic foot exam:  See above MSK exam No skin breakdown  No calluses  Normal DP pulses  Normal sensation to light touch and monofilament  Nails normal

## 2012-07-29 NOTE — Patient Instructions (Addendum)
Go ahead with nutrition class at [facility]. Return for fasting labs in next few days. Work on regular exercise and weight loss, low  carb diet.  Stop at X-ray on your way.  Elevate and ice left foot.  No prolonged standing and walking for greater than 5 hours.  Follow up appt in 1 months for depression.

## 2012-07-29 NOTE — Assessment & Plan Note (Signed)
Due for re-eval on pravastatin 

## 2012-07-29 NOTE — Assessment & Plan Note (Signed)
Well controlled. Continue current medication. Encouraged exercise, weight loss, healthy eating habits.  

## 2012-08-04 ENCOUNTER — Other Ambulatory Visit: Payer: Self-pay | Admitting: *Deleted

## 2012-08-04 MED ORDER — GLIPIZIDE ER 10 MG PO TB24: 10.0000 mg | ORAL_TABLET | Freq: Two times a day (BID) | ORAL | Status: DC

## 2012-08-29 ENCOUNTER — Telehealth: Payer: Self-pay | Admitting: Nurse Practitioner

## 2012-08-29 NOTE — Telephone Encounter (Signed)
Pt is having some lower ovary pain.please call to schedule an appt.

## 2012-08-29 NOTE — Telephone Encounter (Signed)
Left message on CB#VM to return call for appt.. 

## 2012-09-01 ENCOUNTER — Other Ambulatory Visit: Payer: Self-pay | Admitting: Family Medicine

## 2012-09-01 NOTE — Telephone Encounter (Signed)
LMTCB to schedule an appointment

## 2012-09-05 ENCOUNTER — Telehealth: Payer: Self-pay | Admitting: Nurse Practitioner

## 2012-09-05 NOTE — Telephone Encounter (Signed)
Have called patient x 3 since 08/29/2012. Have left messages on CB# VM on need to return call concerning ovary pain. Please advise.

## 2012-09-05 NOTE — Telephone Encounter (Signed)
LMTCB  aa 

## 2012-09-05 NOTE — Telephone Encounter (Signed)
Left patient a message that we have tried several days and several times to contact her about her pain.  We hope this pain has resolved but need to know if she still needs appointment or at [facility] answer her questions.  She is advised to call us back and let us know one way or the other.

## 2012-09-06 ENCOUNTER — Telehealth: Payer: Self-pay | Admitting: Family Medicine

## 2012-09-06 ENCOUNTER — Ambulatory Visit: Payer: Self-pay | Admitting: Nurse Practitioner

## 2012-09-06 ENCOUNTER — Other Ambulatory Visit (INDEPENDENT_AMBULATORY_CARE_PROVIDER_SITE_OTHER): Payer: BC Managed Care – PPO

## 2012-09-06 DIAGNOSIS — E119 Type 2 diabetes mellitus without complications: Secondary | ICD-10-CM

## 2012-09-06 DIAGNOSIS — E785 Hyperlipidemia, unspecified: Secondary | ICD-10-CM

## 2012-09-06 LAB — COMPREHENSIVE METABOLIC PANEL
Alkaline Phosphatase: 56 U/L (ref 39–117)
Creatinine, Ser: 0.8 mg/dL (ref 0.4–1.2)
Glucose, Bld: 187 mg/dL — ABNORMAL HIGH (ref 70–99)
Sodium: 138 mEq/L (ref 135–145)
Total Bilirubin: 0.6 mg/dL (ref 0.3–1.2)
Total Protein: 7 g/dL (ref 6.0–8.3)

## 2012-09-06 LAB — LIPID PANEL
Cholesterol: 136 mg/dL (ref 0–200)
HDL: 47.5 mg/dL (ref >39.00)
LDL Cholesterol: 70 mg/dL (ref 0–99)
Triglycerides: 91 mg/dL (ref 0.0–149.0)
VLDL: 18.2 mg/dL (ref 0.0–40.0)

## 2012-09-06 NOTE — Telephone Encounter (Signed)
Message copied by Kerby Nora E on Tue Sep 06, 2012  8:25 AM ------      Message from: Alvina Chou      Created: Wed Aug 31, 2012 11:06 AM      Regarding: labs for Tuesday, 8.5.14       Labs with no f/u appt ------

## 2012-09-06 NOTE — Telephone Encounter (Signed)
Sorry, I thought A1C was in for this year.

## 2012-09-06 NOTE — Telephone Encounter (Signed)
A1c already ordered.

## 2012-09-07 ENCOUNTER — Encounter: Payer: Self-pay | Admitting: Nurse Practitioner

## 2012-09-07 ENCOUNTER — Ambulatory Visit (INDEPENDENT_AMBULATORY_CARE_PROVIDER_SITE_OTHER): Payer: BC Managed Care – PPO | Admitting: Nurse Practitioner

## 2012-09-07 VITALS — BP 120/60 | HR 80 | Temp 98.2°F | Ht 66.5 in | Wt 176.0 lb

## 2012-09-07 DIAGNOSIS — R1032 Left lower quadrant pain: Secondary | ICD-10-CM

## 2012-09-07 DIAGNOSIS — R102 Pelvic and perineal pain: Secondary | ICD-10-CM

## 2012-09-07 LAB — POCT URINALYSIS DIPSTICK
Bilirubin, UA: NEGATIVE
Blood, UA: NEGATIVE
Glucose, UA: NEGATIVE
Ketones, UA: NEGATIVE
pH, UA: 5

## 2012-09-07 NOTE — Progress Notes (Signed)
Subjective:     Patient ID: Jean Robinson, female   DOB: 1955-12-08, 57 y.o.   MRN: [id number]  HPI  57 yo G4 P4  WMFe present with complaints of pelvic pain. States pain was mainly on the left lower abdomen and reminded her of ovulation pain.  States pain was intermittent, dull, achy and fleeting.  Concerned her because it was persisitent over several days. Never awoke her from sleep, not associated with movement, not relieved with any measures since it was fleeting in nature. No pain for past 4-5 days. Denies  changes with BM or urination.  Past history of renal calculi about 1 year ago but this occurrence was nothing similar. No history of trauma. No GERD. Other medical history includes diabetes and is controlled on oral meds. She does have some concerns about OV cancer but without [facility] of same.   Review of Systems  Constitutional: Negative for fever, chills, appetite change, fatigue and unexpected weight change.  Respiratory: Negative.  Negative for apnea, cough and shortness of breath.   Cardiovascular: Negative.  Negative for chest pain and palpitations.  Gastrointestinal: Negative.  Negative for nausea, vomiting, diarrhea, constipation, blood in stool and abdominal distention.  Endocrine: Negative.  Negative for polydipsia and polyphagia.  Genitourinary: Positive for pelvic pain. Negative for dysuria, urgency, frequency, hematuria, flank pain, vaginal bleeding, vaginal discharge, difficulty urinating, vaginal pain and dyspareunia.  Musculoskeletal: Negative.   Skin: Negative.   Neurological: Negative.   Psychiatric/Behavioral: Negative.        Objective:   Physical Exam  Constitutional: She is oriented to person, place, and time. She appears well-developed and well-nourished. No distress.  Cardiovascular: Normal rate.   Pulmonary/Chest: Effort normal and breath sounds normal.  Abdominal: Soft. Bowel sounds are normal. She exhibits no distension and no mass. There is no tenderness. There is  no rebound and no guarding.  No tenderness LLQ.  Genitourinary:  Normal vaginal discharge. No cervicitis. On bimanual unable to reproduce same pain. No mass felt.  Neurological: She is alert and oriented to person, place, and time.  Psychiatric: She has a normal mood and affect. Her behavior is normal. Judgment and thought content normal.       Assessment:     LLQ pelvic pain ? Etiology - maybe already resolved Concern about OV origin of pain    Plan:    Will schedule PUS for completeness and follow If pain returns or worsens to call back.

## 2012-09-08 LAB — URINE CULTURE: Organism ID, Bacteria: NO GROWTH

## 2012-09-08 NOTE — Progress Notes (Signed)
Encounter reviewed by Dr. Brook Silva.  

## 2012-09-13 ENCOUNTER — Telehealth: Payer: Self-pay | Admitting: *Deleted

## 2012-09-13 NOTE — Telephone Encounter (Signed)
Message copied by Osie Bond on Tue Sep 13, 2012  4:49 PM ------      Message from: Ria Comment R      Created: Tue Sep 13, 2012  8:30 AM       Let patient know negative. PUS is pending. ------

## 2012-09-13 NOTE — Telephone Encounter (Signed)
Pt is aware of negative urine culture results.  

## 2012-09-16 ENCOUNTER — Encounter: Payer: Self-pay | Admitting: Family Medicine

## 2012-09-17 ENCOUNTER — Other Ambulatory Visit: Payer: Self-pay | Admitting: Family Medicine

## 2012-09-21 ENCOUNTER — Telehealth: Payer: Self-pay | Admitting: Nurse Practitioner

## 2012-09-21 NOTE — Telephone Encounter (Signed)
LMTCB to discuss ins benefits and schedule PUS.  °

## 2012-09-30 NOTE — Telephone Encounter (Signed)
LMTCB to discuss ins benefits and schedule PUS.

## 2012-10-04 ENCOUNTER — Telehealth: Payer: Self-pay | Admitting: Nurse Practitioner

## 2012-10-04 NOTE — Telephone Encounter (Signed)
Patient returning your phone call.   

## 2012-10-04 NOTE — Telephone Encounter (Signed)
Pt calling to schedule an ultrasound.

## 2012-10-09 ENCOUNTER — Other Ambulatory Visit: Payer: Self-pay | Admitting: Obstetrics and Gynecology

## 2012-10-09 DIAGNOSIS — N9489 Other specified conditions associated with female genital organs and menstrual cycle: Secondary | ICD-10-CM

## 2012-10-19 ENCOUNTER — Other Ambulatory Visit: Payer: Self-pay | Admitting: Family Medicine

## 2012-10-19 NOTE — Telephone Encounter (Signed)
Last office visit 07/29/2012.  Ok to refill? 

## 2012-10-20 ENCOUNTER — Ambulatory Visit (INDEPENDENT_AMBULATORY_CARE_PROVIDER_SITE_OTHER): Payer: BC Managed Care – PPO | Admitting: Obstetrics and Gynecology

## 2012-10-20 ENCOUNTER — Encounter: Payer: Self-pay | Admitting: Obstetrics and Gynecology

## 2012-10-20 ENCOUNTER — Other Ambulatory Visit: Payer: Self-pay | Admitting: Obstetrics and Gynecology

## 2012-10-20 ENCOUNTER — Ambulatory Visit (INDEPENDENT_AMBULATORY_CARE_PROVIDER_SITE_OTHER): Payer: BC Managed Care – PPO

## 2012-10-20 VITALS — BP 110/66 | HR 76 | Ht 66.5 in | Wt 173.5 lb

## 2012-10-20 DIAGNOSIS — N200 Calculus of kidney: Secondary | ICD-10-CM

## 2012-10-20 DIAGNOSIS — N9489 Other specified conditions associated with female genital organs and menstrual cycle: Secondary | ICD-10-CM

## 2012-10-20 DIAGNOSIS — R1032 Left lower quadrant pain: Secondary | ICD-10-CM

## 2012-10-20 NOTE — Patient Instructions (Signed)

## 2012-10-20 NOTE — Progress Notes (Signed)
Subjective  Patient here for pelvic ultrasound for LLQ pain, now resolved.  Short period of pain.   Cramping and pinching in nature.  Also was accompanied by pressure to urinate.  Had similar was but much less pain that passing a stone.  History of renal calculi.  No current problems with urinary and bowel function.  Just had a family wedding of daughter.    Objective  See ultrasound below - normal ovaries, uterus, endometrium. Mild left collecting system dilation and possible 5 mm stone noted.    Assessment  Probable left renal stone.  Plan  Observation for future symptoms.   Hydrate well. If pain returns, to urology.

## 2012-10-29 ENCOUNTER — Other Ambulatory Visit: Payer: Self-pay | Admitting: Family Medicine

## 2012-11-16 ENCOUNTER — Other Ambulatory Visit: Payer: Self-pay | Admitting: Family Medicine

## 2012-11-16 NOTE — Telephone Encounter (Signed)
Last office visit 07/29/2012.  Ok to refill? 

## 2013-01-21 ENCOUNTER — Other Ambulatory Visit: Payer: Self-pay | Admitting: Family Medicine

## 2013-02-14 ENCOUNTER — Ambulatory Visit: Payer: BC Managed Care – PPO | Admitting: Nurse Practitioner

## 2013-02-20 ENCOUNTER — Other Ambulatory Visit: Payer: Self-pay | Admitting: Family Medicine

## 2013-02-21 NOTE — Telephone Encounter (Signed)
Last office visit 07/29/2012.  Ok to refill?

## 2013-03-24 ENCOUNTER — Ambulatory Visit: Payer: BC Managed Care – PPO | Admitting: Family Medicine

## 2013-04-14 ENCOUNTER — Other Ambulatory Visit: Payer: Self-pay | Admitting: Family Medicine

## 2013-04-14 ENCOUNTER — Other Ambulatory Visit: Payer: Self-pay | Admitting: Nurse Practitioner

## 2013-04-14 DIAGNOSIS — Z1231 Encounter for screening mammogram for malignant neoplasm of breast: Secondary | ICD-10-CM

## 2013-04-19 ENCOUNTER — Ambulatory Visit (HOSPITAL_COMMUNITY)
Admission: RE | Admit: 2013-04-19 | Discharge: 2013-04-19 | Disposition: A | Discharge status: Home / Self Care | Payer: BC Managed Care – PPO | Source: Ambulatory Visit | Attending: Nurse Practitioner | Admitting: Nurse Practitioner

## 2013-04-19 ENCOUNTER — Other Ambulatory Visit: Payer: Self-pay | Admitting: Family Medicine

## 2013-04-19 DIAGNOSIS — Z1231 Encounter for screening mammogram for malignant neoplasm of breast: Secondary | ICD-10-CM | POA: Insufficient documentation

## 2013-05-02 ENCOUNTER — Ambulatory Visit: Payer: BC Managed Care – PPO | Admitting: Family Medicine

## 2013-05-16 ENCOUNTER — Other Ambulatory Visit: Payer: Self-pay | Admitting: Family Medicine

## 2013-05-16 NOTE — Telephone Encounter (Signed)
Last office visit 07/29/2012.  Ok to refill?

## 2013-05-25 ENCOUNTER — Ambulatory Visit: Payer: BC Managed Care – PPO | Admitting: Family Medicine

## 2013-05-25 DIAGNOSIS — Z0289 Encounter for other administrative examinations: Secondary | ICD-10-CM

## 2013-05-26 ENCOUNTER — Telehealth: Payer: Self-pay | Admitting: Nurse Practitioner

## 2013-05-26 NOTE — Telephone Encounter (Signed)
Left message appointment 05/29/13  has been canceled and to call to reschedule.

## 2013-05-29 ENCOUNTER — Ambulatory Visit: Payer: BC Managed Care – PPO | Admitting: Nurse Practitioner

## 2013-05-29 NOTE — Telephone Encounter (Signed)
Patient called back to schedule

## 2013-06-01 ENCOUNTER — Ambulatory Visit: Payer: BC Managed Care – PPO | Admitting: Nurse Practitioner

## 2013-06-01 ENCOUNTER — Telehealth: Payer: Self-pay | Admitting: Nurse Practitioner

## 2013-06-01 NOTE — Telephone Encounter (Signed)
Pt cancel appointment for today for her annual. Pt states she is not feeling well and thinks she may have a some type of stomach bug. She will call back to reschedule at a later time. Waive the fee?

## 2013-06-14 ENCOUNTER — Other Ambulatory Visit: Payer: Self-pay | Admitting: Family Medicine

## 2013-06-22 ENCOUNTER — Telehealth: Payer: Self-pay | Admitting: Family Medicine

## 2013-06-22 ENCOUNTER — Encounter: Payer: Self-pay | Admitting: Family Medicine

## 2013-06-22 ENCOUNTER — Ambulatory Visit (INDEPENDENT_AMBULATORY_CARE_PROVIDER_SITE_OTHER): Payer: BC Managed Care – PPO | Admitting: Family Medicine

## 2013-06-22 VITALS — BP 130/80 | HR 77 | Temp 97.9°F | Ht 66.5 in | Wt 176.0 lb

## 2013-06-22 DIAGNOSIS — S29012A Strain of muscle and tendon of back wall of thorax, initial encounter: Secondary | ICD-10-CM | POA: Insufficient documentation

## 2013-06-22 DIAGNOSIS — E785 Hyperlipidemia, unspecified: Secondary | ICD-10-CM

## 2013-06-22 DIAGNOSIS — F3289 Other specified depressive episodes: Secondary | ICD-10-CM

## 2013-06-22 DIAGNOSIS — F329 Major depressive disorder, single episode, unspecified: Secondary | ICD-10-CM

## 2013-06-22 DIAGNOSIS — E119 Type 2 diabetes mellitus without complications: Secondary | ICD-10-CM

## 2013-06-22 DIAGNOSIS — I10 Essential (primary) hypertension: Secondary | ICD-10-CM

## 2013-06-22 DIAGNOSIS — S239XXA Sprain of unspecified parts of thorax, initial encounter: Secondary | ICD-10-CM

## 2013-06-22 LAB — COMPREHENSIVE METABOLIC PANEL
ALK PHOS: 52 U/L (ref 39–117)
ALT: 18 U/L (ref 0–35)
AST: 19 U/L (ref 0–37)
Albumin: 3.8 g/dL (ref 3.5–5.2)
BILIRUBIN TOTAL: 0.8 mg/dL (ref 0.2–1.2)
BUN: 19 mg/dL (ref 6–23)
CO2: 27 mEq/L (ref 19–32)
CREATININE: 0.7 mg/dL (ref 0.4–1.2)
Calcium: 9.9 mg/dL (ref 8.4–10.5)
Chloride: 105 mEq/L (ref 96–112)
GFR: 90.76 mL/min (ref >60.00)
Glucose, Bld: 142 mg/dL — ABNORMAL HIGH (ref 70–99)
Potassium: 4.3 mEq/L (ref 3.5–5.1)
Sodium: 138 mEq/L (ref 135–145)
Total Protein: 6.7 g/dL (ref 6.0–8.3)

## 2013-06-22 LAB — LIPID PANEL
Cholesterol: 150 mg/dL (ref 0–200)
HDL: 47.4 mg/dL (ref >39.00)
LDL CALC: 81 mg/dL (ref 0–99)
TRIGLYCERIDES: 107 mg/dL (ref 0.0–149.0)
Total CHOL/HDL Ratio: 3
VLDL: 21.4 mg/dL (ref 0.0–40.0)

## 2013-06-22 LAB — HEMOGLOBIN A1C: Hgb A1c MFr Bld: 9 % — ABNORMAL HIGH (ref 4.6–6.5)

## 2013-06-22 MED ORDER — SITAGLIPTIN PHOSPHATE 100 MG PO TABS: 100.0000 mg | ORAL_TABLET | Freq: Every day | ORAL | Status: DC

## 2013-06-22 MED ORDER — VALACYCLOVIR HCL 500 MG PO TABS: ORAL_TABLET | ORAL | Status: DC

## 2013-06-22 MED ORDER — LISINOPRIL 20 MG PO TABS: ORAL_TABLET | ORAL | Status: DC

## 2013-06-22 MED ORDER — SERTRALINE HCL 25 MG PO TABS: ORAL_TABLET | ORAL | Status: DC

## 2013-06-22 MED ORDER — PRAVASTATIN SODIUM 40 MG PO TABS: ORAL_TABLET | ORAL | Status: DC

## 2013-06-22 MED ORDER — MELOXICAM 15 MG PO TABS: ORAL_TABLET | ORAL | Status: DC

## 2013-06-22 NOTE — Progress Notes (Signed)
Pre visit review using our clinic review tool, if applicable. No additional management support is needed unless otherwise documented below in the visit note. 

## 2013-06-22 NOTE — Progress Notes (Signed)
58 year old female presents for 6 month follow up.   Diabetes: Likely poor control on metformin 1000 mg daily and glipizide. Due for re-eval.  Lab Results  Component Value Date   HGBA1C 8.1* 09/06/2012  Using medications without difficulties:Yes.  Hypoglycemic episodes: None  Hyperglycemic episodes: yes  Feet problems: None eye exam within last year: 03/2012, due FBS: 90-140, post prandial: 180-220 ( higher after bad meal  She had issues with Byetta.. SE in past.  She is trying to eat a low carb diet as best she can.  No regular exercise.  Gardening. Wt Readings from Last 3 Encounters:  06/22/13 176 lb (79.833 kg)  10/20/12 173 lb 8 oz (78.699 kg)  09/07/12 176 lb (79.833 kg)     Hypertension: Well controlled on lisinopril  BP Readings from Last 3 Encounters:  06/22/13 130/80  10/20/12 110/66  09/07/12 120/60  Using medication without problems or lightheadedness:  Chest pain with exertion: None,  Edema:None  Short of breath:None  Average home BPs: 120/70  Other issues:   Elevated Cholesterol: Due for re-eval on pravastatin 40 mg daily. Lab Results  Component Value Date   CHOL 136 09/06/2012   HDL 47.50 09/06/2012   LDLCALC 70 09/06/2012   TRIG 91.0 09/06/2012   CHOLHDL 3 09/06/2012  Compliant now with medication.  Using medications without problems: None  Muscle aches: None  Other complaints:   Depression, improved: Insomnia improved as well. Back on sertraline 25 mg daily. No SI, no HI. Has started seeing a counselor.     Review of Systems  Occ intermittant tightness in upper back. Sees massage therapist. NO numbness weakness, no current pain. All other systems reviewed and are negative.  Objective:   Physical Exam  Constitutional: Vital signs are normal. She appears well-developed and well-nourished. She is cooperative. Non-toxic appearance. She does not appear ill. No distress.  HENT:  Head: Normocephalic.  Right Ear: Hearing, tympanic membrane, external ear and  ear canal normal. Tympanic membrane is not erythematous, not retracted and not bulging.  Left Ear: Hearing, tympanic membrane, external ear and ear canal normal. Tympanic membrane is not erythematous, not retracted and not bulging.  Nose: No mucosal edema or rhinorrhea. Right sinus exhibits no maxillary sinus tenderness and no frontal sinus tenderness. Left sinus exhibits no maxillary sinus tenderness and no frontal sinus tenderness.  Mouth/Throat: Uvula is midline, oropharynx is clear and moist and mucous membranes are normal.  Eyes: Conjunctivae, EOM and lids are normal. Pupils are equal, round, and reactive to light. No foreign bodies found.  Neck: Trachea normal and normal range of motion. Neck supple. Carotid bruit is not present. No mass and no thyromegaly present.  Cardiovascular: Normal rate, regular rhythm, S1 normal, S2 normal, normal heart sounds, intact distal pulses and normal pulses. Exam reveals no gallop and no friction rub.  No murmur heard.  Pulmonary/Chest: Effort normal and breath sounds normal. Not tachypneic. No respiratory distress. She has no decreased breath sounds. She has no wheezes. She has no rhonchi. She has no rales.  Abdominal: Soft. Normal appearance and bowel sounds are normal. There is no tenderness.  Neurological: She is alert.  Skin: Skin is warm, dry and intact. No rash noted.  Psychiatric: Her speech is normal and behavior is normal. Judgment and thought content normal. Her mood appears not anxious. Cognition and memory are normal. She does not exhibit a depressed mood.  MSK: full ROM in enck and B shoulders, neg spurling, no ttp  Diabetic  foot exam:  No skin breakdown  No calluses  Normal DP pulses  Normal sensation to light touch and monofilament  Nails normal

## 2013-06-22 NOTE — Telephone Encounter (Signed)
Left message for patient that Januvia 100 mg, take one tablet by mouth daily, has been sent to [facility].

## 2013-06-22 NOTE — Assessment & Plan Note (Signed)
Due for re-eval. 

## 2013-06-22 NOTE — Telephone Encounter (Signed)
Message copied by Jinny Sanders on Thu Jun 22, 2013  4:43 PM ------      Message from: Carter Kitten      Created: Thu Jun 22, 2013  4:30 PM       Patient notified as instructed by telephone.  She would prefer to go on an additional oral medication vs an injectable.  One with the least side effects and long term effects.  Please advise. ------

## 2013-06-22 NOTE — Assessment & Plan Note (Signed)
Home PT info given.

## 2013-06-22 NOTE — Assessment & Plan Note (Signed)
Well controlled. Continue current medication.  

## 2013-06-22 NOTE — Telephone Encounter (Signed)
Add januvia to regimen.

## 2013-06-22 NOTE — Addendum Note (Signed)
Addended by: Ellamae Sia on: 06/22/2013 09:01 AM   Modules accepted: Orders

## 2013-06-22 NOTE — Patient Instructions (Addendum)
Schedule yearly eye exam. Stop at lab on way out.  Start exercise as able. Schedule follow up in 6 moths with fasting labs prior.   Diabetes and Exercise Exercising regularly is important. It is not just about losing weight. It has many health benefits, such as:  Improving your overall fitness, flexibility, and endurance.  Increasing your bone density.  Helping with weight control.  Decreasing your body fat.  Increasing your muscle strength.  Reducing stress and tension.  Improving your overall health. People with diabetes who exercise gain additional benefits because exercise:  Reduces appetite.  Improves the body's use of blood sugar (glucose).  Helps lower or control blood glucose.  Decreases blood pressure.  Helps control blood lipids (such as cholesterol and triglycerides).  Improves the body's use of the hormone insulin by:  Increasing the body's insulin sensitivity.  Reducing the body's insulin needs.  Decreases the risk for heart disease because exercising:  Lowers cholesterol and triglycerides levels.  Increases the levels of good cholesterol (such as high-density lipoproteins [HDL]) in the body.  Lowers blood glucose levels. YOUR ACTIVITY PLAN  Choose an activity that you enjoy and set realistic goals. Your health care provider or diabetes educator can help you make an activity plan that works for you. You can break activities into 2 or 3 sessions throughout the day. Doing so is as good as one long session. Exercise ideas include:  Taking the dog for a walk.  Taking the stairs instead of the elevator.  Dancing to your favorite song.  Doing your favorite exercise with a friend. RECOMMENDATIONS FOR EXERCISING WITH TYPE 1 OR TYPE 2 DIABETES   Check your blood glucose before exercising. If blood glucose levels are greater than 240 mg/dL, check for urine ketones. Do not exercise if ketones are present.  Avoid injecting insulin into areas of the body  that are going to be exercised. For example, avoid injecting insulin into:  The arms when playing tennis.  The legs when jogging.  Keep a record of:  Food intake before and after you exercise.  Expected peak times of insulin action.  Blood glucose levels before and after you exercise.  The type and amount of exercise you have done.  Review your records with your health care provider. Your health care provider will help you to develop guidelines for adjusting food intake and insulin amounts before and after exercising.  If you take insulin or oral hypoglycemic agents, watch for signs and symptoms of hypoglycemia. They include:  Dizziness.  Shaking.  Sweating.  Chills.  Confusion.  Drink plenty of water while you exercise to prevent dehydration or heat stroke. Body water is lost during exercise and must be replaced.  Talk to your health care provider before starting an exercise program to make sure it is safe for you. Remember, almost any type of activity is better than none. Document Released: 04/11/2003 Document Revised: 09/21/2012 Document Reviewed: 06/28/2012 [facility] Patient Information 2014 [place].

## 2013-06-22 NOTE — Assessment & Plan Note (Signed)
Due for re-eval. Encouraged exercise, weight loss, healthy eating habits.  

## 2013-06-22 NOTE — Telephone Encounter (Signed)
Relevant patient education assigned to patient using Emmi. ° °

## 2013-06-22 NOTE — Assessment & Plan Note (Signed)
Well controlled on sertraline.

## 2013-06-28 ENCOUNTER — Other Ambulatory Visit: Payer: Self-pay | Admitting: Family Medicine

## 2013-07-12 ENCOUNTER — Other Ambulatory Visit: Payer: Self-pay | Admitting: Family Medicine

## 2013-08-16 ENCOUNTER — Telehealth: Payer: Self-pay | Admitting: *Deleted

## 2013-08-16 NOTE — Telephone Encounter (Signed)
Lm on pts vm requesting a call back to schedule DIABETIC BUNDLE LAB-needs A1C

## 2013-09-20 ENCOUNTER — Telehealth: Payer: Self-pay | Admitting: Nurse Practitioner

## 2013-09-20 ENCOUNTER — Telehealth: Payer: Self-pay

## 2013-09-20 NOTE — Telephone Encounter (Signed)
Spoke with patient. Advised of message as seen below from [place]. Patient agreeable. Appointment scheduled for Friday at 9:30am with Dr.Silva. Patient agreeable to date and time.  Routing to Dr.Silva Cc: Milford Cage, FNP   Routing to provider for final review. Patient agreeable to disposition. Will close encounter

## 2013-09-20 NOTE — Telephone Encounter (Signed)
Pt stated you can leave voice mail message

## 2013-09-20 NOTE — Telephone Encounter (Signed)
Spoke with patient. Patient states that she has a "little twinge" that is in her left lower abdomen. States that the pain is not severe or constant. "It is very minimal pain but I don't want to let it go on for long without getting it looked at." Patient had PUS 10/20/12 with the finding of possible kidney stones. Patient states that "This is different. I have had kidney stones before and this is not the same." Advised patient that I would send a message over to Dr.Silva and give patient a call back with further recommendations. Advised patient another PUS may be needed to evaluate ovaries. Patient is agreeable.

## 2013-09-20 NOTE — Telephone Encounter (Signed)
Pt is having some left ovarian pain. Please call to schedule.

## 2013-09-20 NOTE — Telephone Encounter (Signed)
Pt left v/m requesting cb about most recent lab results for work form. Left v/m requesting cb.

## 2013-09-20 NOTE — Telephone Encounter (Signed)
[name] notified we do not have any discount cards/coupons for Januvia.  Jean Robinson states [employer] found her one to get her Januvia for $5.

## 2013-09-20 NOTE — Telephone Encounter (Signed)
Please make an appointment for the patient to be seen. After evaluation, we can determine the next best step in her care. This could possibly include an ultrasound.

## 2013-09-20 NOTE — Telephone Encounter (Signed)
Pt came in to get lab print out.  Gave pt labs from 5/21 pt signed medical release form  Pt also wanted to know is you have any coupon or discount cards for [place].  She stated this was going to cost her $100 per month with insurance

## 2013-09-22 ENCOUNTER — Telehealth: Payer: Self-pay | Admitting: Obstetrics and Gynecology

## 2013-09-22 ENCOUNTER — Ambulatory Visit: Payer: BC Managed Care – PPO | Admitting: Obstetrics and Gynecology

## 2013-09-22 NOTE — Telephone Encounter (Signed)
Please make note in the computer of the several cancelled appointments.

## 2013-09-22 NOTE — Telephone Encounter (Signed)
Patient canceled her appointment today for "lower abdominal pain". Patient says she "woke up with an upset stomach" and will call later to reschedule.

## 2013-09-29 ENCOUNTER — Encounter: Payer: Self-pay | Admitting: Nurse Practitioner

## 2013-10-04 ENCOUNTER — Other Ambulatory Visit: Payer: Self-pay | Admitting: Family Medicine

## 2013-10-04 NOTE — Telephone Encounter (Signed)
Last office visit 06/22/2013.  Last refilled 06/22/2013 for #30 with 2 refills.  Ok to refill?

## 2013-10-30 ENCOUNTER — Ambulatory Visit: Payer: BC Managed Care – PPO | Admitting: Nurse Practitioner

## 2013-11-09 ENCOUNTER — Encounter: Payer: Self-pay | Admitting: Nurse Practitioner

## 2013-11-09 ENCOUNTER — Ambulatory Visit (INDEPENDENT_AMBULATORY_CARE_PROVIDER_SITE_OTHER): Payer: BC Managed Care – PPO | Admitting: Nurse Practitioner

## 2013-11-09 VITALS — BP 140/84 | HR 88 | Ht 66.0 in | Wt 175.0 lb

## 2013-11-09 DIAGNOSIS — Z01419 Encounter for gynecological examination (general) (routine) without abnormal findings: Secondary | ICD-10-CM

## 2013-11-09 DIAGNOSIS — R8290 Unspecified abnormal findings in urine: Secondary | ICD-10-CM

## 2013-11-09 DIAGNOSIS — Z0000 Encounter for general adult medical examination without abnormal findings: Secondary | ICD-10-CM

## 2013-11-09 LAB — POCT URINALYSIS DIPSTICK
Bilirubin, UA: NEGATIVE
Blood, UA: NEGATIVE
Glucose, UA: 100
KETONES UA: NEGATIVE
Nitrite, UA: NEGATIVE
PH UA: 5
Protein, UA: NEGATIVE
Urobilinogen, UA: NEGATIVE

## 2013-11-09 LAB — HEMOGLOBIN, FINGERSTICK: Hemoglobin, fingerstick: 13.5 g/dL (ref 12.0–16.0)

## 2013-11-09 MED ORDER — NITROFURANTOIN MONOHYD MACRO 100 MG PO CAPS: 100.0000 mg | ORAL_CAPSULE | Freq: Two times a day (BID) | ORAL | Status: DC

## 2013-11-09 MED ORDER — VALACYCLOVIR HCL 500 MG PO TABS: ORAL_TABLET | ORAL | Status: DC

## 2013-11-09 NOTE — Patient Instructions (Signed)

## 2013-11-09 NOTE — Progress Notes (Signed)
Patient ID: Jean Robinson, female   DOB: July 10, 1955, 58 y.o.   MRN: [id number] 58 y.o. [id number] Married Caucasian Fe here for annual exam.  Has had some lower pelvic pain with twinges' that she felt was related to kidney stone.  First kidney stone 10/2009. last HGB AIC was 9.0 and was on [place] for a month.  She did better but cost of med's was too high.  Now with a coupon for this med she is back on for 3 weeks.  A good friend of hers is having a lot of heath problems and is dying.  She is upset about the circumstances of this.  No LMP recorded. Patient is postmenopausal.          Sexually active: Yes.    The current method of family planning is post menopausal status.    Exercising: Yes.    walking Smoker:  no  Health Maintenance: Pap:  02/09/12 normal pap with negative HR HPV MMG:  04/19/13, 3D, Bi-Rads 1:  Negative  Colonoscopy:  04/20/07, normal, repeat in 10 years BMD:   Never  TDaP:  12/27/06 Labs:  HB:  13.5   Urine:  2+ leuk's, 100 glucose   reports that she has never smoked. She has never used smokeless tobacco. She reports that she drinks alcohol. She reports that she does not use illicit drugs.  Past Medical History  Diagnosis Date  . Diabetes mellitus   . Hyperlipidemia   . Hypertension   . Allergy   . STD (sexually transmitted disease)     HSV I & II, IgG reflex positive  . H/O renal calculi 10/08/09    Past Surgical History  Procedure Laterality Date  . Appendectomy    . Cholecystectomy    . Tonsillectomy      Current Outpatient Prescriptions  Medication Sig Dispense Refill  . aspirin 81 MG tablet Take 81 mg by mouth daily.        Marland Kitchen GLIPIZIDE XL 10 MG 24 hr tablet TAKE 1 TABLET BY MOUTH TWICE A DAY  60 tablet  5  . glucose blood (ONE TOUCH ULTRA TEST) test strip Use to check blood sugars two times a day.  Dx: 250.00  100 each  5  . Lancet Device MISC by Does not apply route. Check blood sugar  1-2 times per day dx250.0       . lisinopril (PRINIVIL,ZESTRIL) 20 MG tablet  TAKE 1 TABLET BY MOUTH DAILY  30 tablet  11  . meloxicam (MOBIC) 15 MG tablet TAKE 1 TABLET BY MOUTH DAILY  30 tablet  2  . metFORMIN (GLUCOPHAGE-XR) 500 MG 24 hr tablet TAKE TWO (2) TABLETS BY MOUTH 2 TIMES DAILY  120 tablet  5  . pravastatin (PRAVACHOL) 40 MG tablet TAKE 1 TABLET BY MOUTH DAILY  30 tablet  11  . sertraline (ZOLOFT) 25 MG tablet TAKE 1 TABLET BY MOUTH DAILY  30 tablet  5  . sitaGLIPtin (JANUVIA) 100 MG tablet Take 1 tablet (100 mg total) by mouth daily.  30 tablet  11  . valACYclovir (VALTREX) 500 MG tablet TAKE 1 TABLET BY MOUTH DAILY  30 tablet  11  . nitrofurantoin, macrocrystal-monohydrate, (MACROBID) 100 MG capsule Take 1 capsule (100 mg total) by mouth 2 (two) times daily.  14 capsule  0   No current facility-administered medications for this visit.    Family History  Problem Relation Age of Onset  . COPD Mother   . Diabetes Mother   .  Macular degeneration Mother   . Depression Father   . Diabetes Father   . Diabetes Brother   . Cancer Other     liver  . Diabetes Other     ROS:  Pertinent items are noted in HPI.  Otherwise, a comprehensive ROS was negative.  Exam:   BP 140/84  Pulse 88  Ht 5\' 6"  (1.676 m)  Wt 175 lb (79.379 kg)  BMI 28.26 kg/m2 Height: 5\' 6"  (167.6 cm)  Ht Readings from Last 3 Encounters:  11/09/13 5\' 6"  (1.676 m)  06/22/13 5' 6.5" (1.689 m)  10/20/12 5' 6.5" (1.689 m)    General appearance: alert, cooperative and appears stated age Head: Normocephalic, without obvious abnormality, atraumatic Neck: no adenopathy, supple, symmetrical, trachea midline and thyroid normal to inspection and palpation Lungs: clear to auscultation bilaterally Breasts: normal appearance, no masses or tenderness Heart: regular rate and rhythm Abdomen: soft, non-tender; no masses,  no organomegaly, no flank pain or pressure lower abdomen Extremities: extremities normal, atraumatic, no cyanosis or edema Skin: Skin color, texture, turgor normal. No rashes or  lesions Lymph nodes: Cervical, supraclavicular, and axillary nodes normal. No abnormal inguinal nodes palpated Neurologic: Grossly normal   Pelvic: External genitalia:  no lesions              Urethra:  normal appearing urethra with no masses, tenderness or lesions              Bartholin's and Skene's: normal                 Vagina: normal appearing vagina with normal color and discharge, no lesions              Cervix: anteverted              Pap taken: No. Bimanual Exam:  Uterus:  normal size, contour, position, consistency, mobility, non-tender              Adnexa: no mass, fullness, tenderness               Rectovaginal: Confirms               Anus:  normal sphincter tone, no lesions  A:  Well Woman with normal exam  Postmenopausal  History of renal calculi, R/O UTI  History of situational stressors  History of DM - not well controlled  History of HSV I/II  History of hyperlipidemia, HTN,   P:   Reviewed health and wellness pertinent to exam  Pap smear not taken today  Mammogram is due 3/16  Rx is given for Macrobid to have on hold in case she gets worsening symptoms before culture is back  Refill on Valtrex for a year  Counseled on breast self exam, mammography screening, adequate intake of calcium and vitamin D, diet and exercise, Kegel's exercises return annually or prn  An After Visit Summary was printed and given to the patient.

## 2013-11-10 LAB — URINALYSIS, MICROSCOPIC ONLY
CRYSTALS: NONE SEEN
Casts: NONE SEEN

## 2013-11-11 LAB — URINE CULTURE: Colony Count: 5000

## 2013-11-14 NOTE — Progress Notes (Signed)
Encounter reviewed by Dr. Edris Schneck Silva.  

## 2013-11-15 ENCOUNTER — Telehealth: Payer: Self-pay | Admitting: Nurse Practitioner

## 2013-11-15 NOTE — Telephone Encounter (Signed)
Patient calling to see if recent lab results are back yet.

## 2013-11-15 NOTE — Telephone Encounter (Signed)
Spoke with patient. Advised of results as seen below. Patient states that she did not start the antibiotics because "Patty gave them to me as a precaution over the weekend if things got worse or I got a fever. I never had any more symptoms so I did not take them." Advised this is okay as patient did not need the antibiotics per results.  Notes Recorded by Milford Cage, FNP on 11/12/2013 at 5:05 PM Please let patient know that urine culture had insignificant growth and does not need med's. If she started them this weekend - after 3 days, she may Dc and hold the rest.  Routing to provider for final review. Patient agreeable to disposition. Will close encounter

## 2013-11-16 ENCOUNTER — Telehealth: Payer: Self-pay | Admitting: *Deleted

## 2013-11-16 NOTE — Telephone Encounter (Signed)
RC from pt.  She was notified of these results on 11/15/13 by Verline Lema, [place].

## 2013-11-16 NOTE — Telephone Encounter (Signed)
Message copied by Graylon Good on Thu Nov 16, 2013  2:35 PM ------      Message from: Kem Boroughs R      Created: Sun Nov 12, 2013  5:05 PM       Please let patient know that urine culture had insignificant growth and does not need med's.  If she started them this weekend - after 3 days, she may Dc and hold the rest. ------

## 2013-11-16 NOTE — Telephone Encounter (Signed)
I have attempted to contact this patient by phone with the following results: left message to return call to [place] at [phone] on answering machine (mobile per [facility]).  [phone] (Mobile)

## 2013-11-28 ENCOUNTER — Encounter: Payer: Self-pay | Admitting: Family Medicine

## 2013-11-28 ENCOUNTER — Ambulatory Visit (INDEPENDENT_AMBULATORY_CARE_PROVIDER_SITE_OTHER): Payer: BC Managed Care – PPO | Admitting: Family Medicine

## 2013-11-28 VITALS — BP 140/83 | HR 82 | Temp 98.4°F | Ht 66.5 in | Wt 173.5 lb

## 2013-11-28 DIAGNOSIS — J0110 Acute frontal sinusitis, unspecified: Secondary | ICD-10-CM

## 2013-11-28 DIAGNOSIS — L237 Allergic contact dermatitis due to plants, except food: Secondary | ICD-10-CM | POA: Insufficient documentation

## 2013-11-28 DIAGNOSIS — J019 Acute sinusitis, unspecified: Secondary | ICD-10-CM | POA: Insufficient documentation

## 2013-11-28 DIAGNOSIS — I10 Essential (primary) hypertension: Secondary | ICD-10-CM

## 2013-11-28 DIAGNOSIS — L255 Unspecified contact dermatitis due to plants, except food: Secondary | ICD-10-CM

## 2013-11-28 MED ORDER — AMOXICILLIN 500 MG PO CAPS: 1000.0000 mg | ORAL_CAPSULE | Freq: Two times a day (BID) | ORAL | Status: DC

## 2013-11-28 MED ORDER — GUAIFENESIN-CODEINE 100-10 MG/5ML PO SYRP: 5.0000 mL | ORAL_SOLUTION | Freq: Every evening | ORAL | Status: DC | PRN

## 2013-11-28 MED ORDER — PREDNISONE 20 MG PO TABS: ORAL_TABLET | ORAL | Status: DC

## 2013-11-28 NOTE — Patient Instructions (Signed)
Mucinex DM during the day, Cough supressant at night.  Prednsione taper for rash and allergies.  If not feeling better with sinuses in 3-4 days.. Fill antibiotics.  Avoid decongestants, recheck BP next week. Goal < 140/90.

## 2013-11-28 NOTE — Assessment & Plan Note (Signed)
BP likely elevated due to decongestant.

## 2013-11-28 NOTE — Assessment & Plan Note (Signed)
Allergies vs bacterial. Treat with prednisone. Fill antibiotics if not improving as expected.

## 2013-11-28 NOTE — Assessment & Plan Note (Signed)
Steroid taper 

## 2013-11-28 NOTE — Progress Notes (Signed)
Pre visit review using our clinic review tool, if applicable. No additional management support is needed unless otherwise documented below in the visit note. 

## 2013-11-28 NOTE — Progress Notes (Signed)
   Subjective:    Patient ID: Jean Robinson, female    DOB: August 02, 1955, 58 y.o.   MRN: [id number]  [place] Pertinent negatives include no eye pain, fatigue, fever or shortness of breath.  Dizziness Pertinent negatives include no abdominal pain, chest pain, fatigue or fever.    58 year old female presetns with new onset rash  In left antecubital fossa and B arms. Very itchy. Blisters on red background. Some in linear pattern. Slight improvement with Tecnu and benadryl.  No facial lesions, some on left chin.    She has a dog, has been outside off and on.    She has beenusing essentail oils for allergies causing nasal congestion. She ran out of oils.  In last  teeth hurting, green nasal discharge. Sinus forehead pressure. Sore throat. Body ache. Ongoing for last 1 week.  Lightheaded yesterday ( BP was 171/91). She has been taking decongestants!!! NO ear pain.  No fever.  No SOB, no cough.   She is now off decongestant. BP Readings from Last 3 Encounters:  11/28/13 140/83  11/09/13 140/84  06/22/13 130/80     Review of Systems  Constitutional: Negative for fever and fatigue.  HENT: Negative for ear pain.   Eyes: Negative for pain.  Respiratory: Negative for chest tightness and shortness of breath.   Cardiovascular: Negative for chest pain, palpitations and leg swelling.  Gastrointestinal: Negative for abdominal pain.  Genitourinary: Negative for dysuria.  Neurological: Positive for dizziness.       Objective:   Physical Exam  Constitutional: Vital signs are normal. She appears well-developed and well-nourished. She is cooperative.  Non-toxic appearance. She does not appear ill. No distress.  HENT:  Head: Normocephalic.  Right Ear: Hearing, tympanic membrane, external ear and ear canal normal. Tympanic membrane is not erythematous, not retracted and not bulging.  Left Ear: Hearing, tympanic membrane, external ear and ear canal normal. Tympanic membrane is not erythematous,  not retracted and not bulging.  Nose: No mucosal edema or rhinorrhea. Right sinus exhibits no maxillary sinus tenderness and no frontal sinus tenderness. Left sinus exhibits no maxillary sinus tenderness and no frontal sinus tenderness.  Mouth/Throat: Uvula is midline, oropharynx is clear and moist and mucous membranes are normal.  Eyes: Conjunctivae, EOM and lids are normal. Pupils are equal, round, and reactive to light. Lids are everted and swept, no foreign bodies found.  Neck: Trachea normal and normal range of motion. Neck supple. Carotid bruit is not present. No mass and no thyromegaly present.  Cardiovascular: Normal rate, regular rhythm, S1 normal, S2 normal, normal heart sounds, intact distal pulses and normal pulses.  Exam reveals no gallop and no friction rub.   No murmur heard. Pulmonary/Chest: Effort normal and breath sounds normal. Not tachypneic. No respiratory distress. She has no decreased breath sounds. She has no wheezes. She has no rhonchi. She has no rales.  Abdominal: Soft. Normal appearance and bowel sounds are normal. There is no tenderness.  Neurological: She is alert.  Skin: Skin is warm, dry and intact. No rash noted.  Dry flaky rash on left antecubital fossa, linear blister on arms erythematous background.  Psychiatric: Her speech is normal and behavior is normal. Judgment and thought content normal. Her mood appears not anxious. Cognition and memory are normal. She does not exhibit a depressed mood.          Assessment & Plan:

## 2013-11-29 ENCOUNTER — Telehealth: Payer: Self-pay

## 2013-11-29 MED ORDER — TRIAMCINOLONE ACETONIDE 0.5 % EX CREA: 1.0000 "application " | TOPICAL_CREAM | Freq: Two times a day (BID) | CUTANEOUS | Status: DC

## 2013-11-29 NOTE — Telephone Encounter (Signed)
Given elevated CBG and insomnia. i would stop the prednisone. We can instead send in a topical steroid to treat her rash. Rx sent.

## 2013-11-29 NOTE — Telephone Encounter (Signed)
Pt left v/m; pt was seen on 11/28/13 and started prednisone 11/28/13 at 11:00 AM; pt went to bed at 10 pm last night and still was not asleep at 3:30 AM. 2 hours after taking prednisone pts BS was elevated at 400; the FBS on 11/28/13 was 119. Pt wants to know if should continue taking prednisone and if to continue prednisone what to take for sleep and what to do about BS. [place]. Pt request cb.

## 2013-11-30 MED ORDER — ONETOUCH ULTRASOFT LANCETS MISC: Status: DC

## 2013-11-30 NOTE — Telephone Encounter (Signed)
Mali notified as instructed by telephone.  While on the phone [place] also request prescription be sent in for her lancets.  Prescription sent to [facility].

## 2013-12-04 ENCOUNTER — Encounter: Payer: Self-pay | Admitting: Family Medicine

## 2013-12-08 ENCOUNTER — Other Ambulatory Visit: Payer: Self-pay | Admitting: *Deleted

## 2013-12-08 NOTE — Telephone Encounter (Signed)
This was a RX from previous UTI. Patient needs phone call to see why she needs this

## 2013-12-08 NOTE — Telephone Encounter (Signed)
Incoming fax requesting Macrobid   Last AEX and refill 11/09/13 #14/0R No future appt   Please advise

## 2013-12-10 LAB — HM DIABETES EYE EXAM

## 2013-12-11 NOTE — Telephone Encounter (Signed)
Called pt and she states she doesn't need Rx. She still has previous Rx on hand.  Rx refused. Pt notified.

## 2013-12-26 ENCOUNTER — Other Ambulatory Visit: Payer: Self-pay | Admitting: Family Medicine

## 2014-01-23 ENCOUNTER — Other Ambulatory Visit: Payer: Self-pay | Admitting: Family Medicine

## 2014-02-05 ENCOUNTER — Other Ambulatory Visit (INDEPENDENT_AMBULATORY_CARE_PROVIDER_SITE_OTHER): Payer: BLUE CROSS/BLUE SHIELD

## 2014-02-05 ENCOUNTER — Telehealth: Payer: Self-pay | Admitting: Family Medicine

## 2014-02-05 DIAGNOSIS — E119 Type 2 diabetes mellitus without complications: Secondary | ICD-10-CM

## 2014-02-05 LAB — COMPREHENSIVE METABOLIC PANEL
ALK PHOS: 61 U/L (ref 39–117)
ALT: 17 U/L (ref 0–35)
AST: 19 U/L (ref 0–37)
Albumin: 4.3 g/dL (ref 3.5–5.2)
BUN: 16 mg/dL (ref 6–23)
CO2: 27 mEq/L (ref 19–32)
Calcium: 10 mg/dL (ref 8.4–10.5)
Chloride: 105 mEq/L (ref 96–112)
Creatinine, Ser: 0.6 mg/dL (ref 0.4–1.2)
GFR: 100.44 mL/min (ref >60.00)
GLUCOSE: 113 mg/dL — AB (ref 70–99)
Potassium: 4.4 mEq/L (ref 3.5–5.1)
SODIUM: 139 meq/L (ref 135–145)
TOTAL PROTEIN: 7.3 g/dL (ref 6.0–8.3)
Total Bilirubin: 0.7 mg/dL (ref 0.2–1.2)

## 2014-02-05 LAB — LIPID PANEL
CHOLESTEROL: 156 mg/dL (ref 0–200)
HDL: 47.1 mg/dL (ref >39.00)
LDL CALC: 81 mg/dL (ref 0–99)
NonHDL: 108.9
TRIGLYCERIDES: 140 mg/dL (ref 0.0–149.0)
Total CHOL/HDL Ratio: 3
VLDL: 28 mg/dL (ref 0.0–40.0)

## 2014-02-05 LAB — HEMOGLOBIN A1C: Hgb A1c MFr Bld: 8.1 % — ABNORMAL HIGH (ref 4.6–6.5)

## 2014-02-05 NOTE — Telephone Encounter (Signed)
-----   Message from Ellamae Sia sent at 01/30/2014 11:16 AM EST ----- Regarding: Lab orders for Monday, 1.4.16 Lab orders for a DM f/u

## 2014-02-09 ENCOUNTER — Ambulatory Visit (INDEPENDENT_AMBULATORY_CARE_PROVIDER_SITE_OTHER): Payer: BLUE CROSS/BLUE SHIELD | Admitting: Family Medicine

## 2014-02-09 ENCOUNTER — Encounter: Payer: Self-pay | Admitting: Family Medicine

## 2014-02-09 VITALS — BP 120/70 | HR 97 | Temp 98.6°F | Ht 66.5 in | Wt 176.8 lb

## 2014-02-09 DIAGNOSIS — E785 Hyperlipidemia, unspecified: Secondary | ICD-10-CM

## 2014-02-09 DIAGNOSIS — E119 Type 2 diabetes mellitus without complications: Secondary | ICD-10-CM

## 2014-02-09 DIAGNOSIS — I10 Essential (primary) hypertension: Secondary | ICD-10-CM

## 2014-02-09 LAB — HM DIABETES FOOT EXAM

## 2014-02-09 NOTE — Patient Instructions (Signed)
Continue working on low carb diet and weight loss.  Add exercise as tolerated.  Take [place] daily.  Follow up in 3 months with labs prior.

## 2014-02-09 NOTE — Progress Notes (Signed)
6 month follow up   Diabetes, poor control but improved. She has added [place] but is not taking daily. She is on max met and glip.  She hasbeen working hard on lifestyle changes. She is trying to eat a low carb diet as best she can.  Lab Results  Component Value Date   HGBA1C 8.1* 02/05/2014  Using medications without difficulties:Yes.  Hypoglycemic episodes: None  Hyperglycemic episodes: yes  Feet problems: None eye exam within last year: 12/2013 FBS: 90-105, post prandial: 160 She had issues with [place].. SE in past.  She has started supplement for curbing appetite in past week, has helped a lot no palpitations.  No regular exercise. Gardening. Wt Readings from Last 3 Encounters:  02/09/14 176 lb 12 oz (80.173 kg)  11/28/13 173 lb 8 oz (78.699 kg)  11/09/13 175 lb (79.379 kg)    Hypertension: Well controlled on lisinopril  BP Readings from Last 3 Encounters:  02/09/14 120/70  11/28/13 140/83  11/09/13 140/84  Using medication without problems or lightheadedness:  Chest pain with exertion: None Edema:None  Short of breath:None  Average home BPs: 120/70  Other issues:   Elevated Cholesterol: AT GOAL on pravastatin 40 mg daily. Lab Results  Component Value Date   CHOL 156 02/05/2014   HDL 47.10 02/05/2014   LDLCALC 81 02/05/2014   TRIG 140.0 02/05/2014   CHOLHDL 3 02/05/2014  Compliant now with medication.  Using medications without problems: None  Muscle aches: None  Other complaints:  NO current exercise.  Review of Systems  Occ intermittant tightness in upper back. Sees massage therapist. NO numbness weakness, no current pain. All other systems reviewed and are negative.  Objective:   Physical Exam  Constitutional: Vital signs are normal. She appears well-developed and well-nourished. She is cooperative. Non-toxic appearance. She does not appear ill. No distress.  HENT:  Head: Normocephalic.  Right Ear: Hearing, tympanic membrane,  external ear and ear canal normal. Tympanic membrane is not erythematous, not retracted and not bulging.  Left Ear: Hearing, tympanic membrane, external ear and ear canal normal. Tympanic membrane is not erythematous, not retracted and not bulging.  Nose: No mucosal edema or rhinorrhea. Right sinus exhibits no maxillary sinus tenderness and no frontal sinus tenderness. Left sinus exhibits no maxillary sinus tenderness and no frontal sinus tenderness.  Mouth/Throat: Uvula is midline, oropharynx is clear and moist and mucous membranes are normal.  Eyes: Conjunctivae, EOM and lids are normal. Pupils are equal, round, and reactive to light. No foreign bodies found.  Neck: Trachea normal and normal range of motion. Neck supple. Carotid bruit is not present. No mass and no thyromegaly present.  Cardiovascular: Normal rate, regular rhythm, S1 normal, S2 normal, normal heart sounds, intact distal pulses and normal pulses. Exam reveals no gallop and no friction rub.  No murmur heard.  Pulmonary/Chest: Effort normal and breath sounds normal. Not tachypneic. No respiratory distress. She has no decreased breath sounds. She has no wheezes. She has no rhonchi. She has no rales.  Abdominal: Soft. Normal appearance and bowel sounds are normal. There is no tenderness.  Neurological: She is alert.  Skin: Skin is warm, dry and intact. No rash noted.  Psychiatric: Her speech is normal and behavior is normal. Judgment and thought content normal. Her mood appears not anxious. Cognition and memory are normal. She does not exhibit a depressed mood.  MSK: full ROM in enck and B shoulders, neg spurling, no ttp  Diabetic foot exam:  No skin breakdown  No calluses  Normal DP pulses  Normal sensation to light touch and monofilament  Nails normal

## 2014-02-09 NOTE — Progress Notes (Signed)
Pre visit review using our clinic review tool, if applicable. No additional management support is needed unless otherwise documented below in the visit note. 

## 2014-02-19 ENCOUNTER — Other Ambulatory Visit: Payer: Self-pay | Admitting: Family Medicine

## 2014-03-09 NOTE — Assessment & Plan Note (Signed)
Well controlled. Continue current medication.  

## 2014-03-09 NOTE — Assessment & Plan Note (Signed)
Continue working on low carb diet and weight loss.  Add exercise as tolerated.  Take [place] daily.  Follow up in 3 months with labs prior.

## 2014-06-07 ENCOUNTER — Ambulatory Visit (INDEPENDENT_AMBULATORY_CARE_PROVIDER_SITE_OTHER): Payer: BLUE CROSS/BLUE SHIELD | Admitting: Internal Medicine

## 2014-06-07 ENCOUNTER — Encounter: Payer: Self-pay | Admitting: Internal Medicine

## 2014-06-07 VITALS — BP 128/80 | HR 91 | Temp 98.3°F | Wt 179.0 lb

## 2014-06-07 DIAGNOSIS — J0100 Acute maxillary sinusitis, unspecified: Secondary | ICD-10-CM

## 2014-06-07 DIAGNOSIS — S40862A Insect bite (nonvenomous) of left upper arm, initial encounter: Secondary | ICD-10-CM | POA: Diagnosis not present

## 2014-06-07 DIAGNOSIS — W57XXXA Bitten or stung by nonvenomous insect and other nonvenomous arthropods, initial encounter: Secondary | ICD-10-CM | POA: Diagnosis not present

## 2014-06-07 MED ORDER — AMOXICILLIN 500 MG PO TABS: 1000.0000 mg | ORAL_TABLET | Freq: Two times a day (BID) | ORAL | Status: DC

## 2014-06-07 NOTE — Assessment & Plan Note (Signed)
Doesn't only seem to be allergy--though that may be inciting event Symptoms for 2 weeks now Discussed symptomatic Rx Will treat with amoxil

## 2014-06-07 NOTE — Assessment & Plan Note (Signed)
Only little nodule at site No inflammation Was only on briefly No synovitis Reassured---doesn't seem to have tick borne disease

## 2014-06-07 NOTE — Progress Notes (Signed)
Pre visit review using our clinic review tool, if applicable. No additional management support is needed unless otherwise documented below in the visit note. 

## 2014-06-07 NOTE — Progress Notes (Signed)
Subjective:    Patient ID: Jean Robinson, female    DOB: February 15, 1955, 59 y.o.   MRN: [id number]  HPI Here due to feeling "miserable"  Allergy problems--tend to be year round but still worse in spring and fall Zyrtec and claritin seem to not be working Has been using essential oil with diffuser  Now with sore throat Swollen glands--which were tender, congestion in head Drainage down throat Some cough--some thick mucus No fever No headache--just feels maxillary fullness Inhaling steam some help  Now having achy joints though--for 7-10 days. Feels tired Did have tick bite ~10 days ago. Probably only on for a short time Itchiness but no ring or redness Worried about tick borne illness  Current Outpatient Prescriptions on File Prior to Visit  Medication Sig Dispense Refill  . aspirin 81 MG tablet Take 81 mg by mouth daily.      Marland Kitchen glipiZIDE (GLUCOTROL XL) 10 MG 24 hr tablet TAKE 1 TABLET BY MOUTH TWICE A DAY 60 tablet 5  . glucose blood (ONE TOUCH ULTRA TEST) test strip Use to check blood sugars two times a day.  Dx: 250.00 100 each 5  . Lancet Device MISC by Does not apply route. Check blood sugar  1-2 times per day dx250.0     . Lancets (ONETOUCH ULTRASOFT) lancets Use to check blood sugars twice a day.  Dx: 250.00 100 each 11  . lisinopril (PRINIVIL,ZESTRIL) 20 MG tablet TAKE 1 TABLET BY MOUTH DAILY 30 tablet 11  . meloxicam (MOBIC) 15 MG tablet TAKE 1 TABLET BY MOUTH DAILY 30 tablet 0  . metFORMIN (GLUCOPHAGE-XR) 500 MG 24 hr tablet TAKE TWO (2) TABLETS BY MOUTH 2 TIMES DAILY 120 tablet 5  . pravastatin (PRAVACHOL) 40 MG tablet TAKE 1 TABLET BY MOUTH DAILY 30 tablet 11  . sertraline (ZOLOFT) 25 MG tablet TAKE 1 TABLET BY MOUTH DAILY 30 tablet 5  . sitaGLIPtin (JANUVIA) 100 MG tablet Take 1 tablet (100 mg total) by mouth daily. 30 tablet 11  . valACYclovir (VALTREX) 500 MG tablet TAKE 1 TABLET BY MOUTH DAILY 30 tablet 11   No current facility-administered medications on file prior  to visit.    No Known Allergies  Past Medical History  Diagnosis Date  . Diabetes mellitus   . Hyperlipidemia   . Hypertension   . Allergy   . STD (sexually transmitted disease)     HSV I & II, IgG reflex positive  . H/O renal calculi 10/08/09    Past Surgical History  Procedure Laterality Date  . Appendectomy    . Cholecystectomy    . Tonsillectomy      Family History  Problem Relation Age of Onset  . COPD Mother   . Diabetes Mother   . Macular degeneration Mother   . Depression Father   . Diabetes Father   . Diabetes Brother   . Cancer Other     liver  . Diabetes Other     History   Social History  . Marital Status: Married    Spouse Name: N/A  . Number of Children: 4  . Years of Education: N/A   Occupational History  . [employer]    Social History Main Topics  . Smoking status: Never Smoker   . Smokeless tobacco: Never Used  . Alcohol Use: Yes     Comment: occassionally  . Drug Use: No  . Sexual Activity:    Partners: Male     Comment: 1st spouse died 2023-01-03 secondary  to respiratory failure of drug overdose. Remaried 2007   Other Topics Concern  . Not on file   Social History Narrative   Regular exercise --yes, treadmill 15 min 3 days per week    Review of Systems  No rash No vomiting or diarrhea Appetite is fine     Objective:   Physical Exam  Constitutional: She appears well-developed and well-nourished. No distress.  HENT:  No sinus tenderness TMs normal Moderate nasal inflammation  Neck: Normal range of motion. Neck supple. No thyromegaly present.  Pulmonary/Chest: Effort normal and breath sounds normal. No respiratory distress. She has no wheezes. She has no rales.  Musculoskeletal:  No synovitis  Lymphadenopathy:    She has no cervical adenopathy.  Skin: No rash noted.          Assessment & Plan:

## 2014-07-03 ENCOUNTER — Telehealth: Payer: Self-pay | Admitting: Family Medicine

## 2014-07-03 NOTE — Telephone Encounter (Signed)
Will you please call and schedule a 3 month follow up with fasting labs prior with Dr. Diona Browner and then route me back this message so I can refill her medication to get her to her appointment.

## 2014-07-03 NOTE — Telephone Encounter (Signed)
Last office visit 06/07/2014 with Dr. Silvio Pate for sinusitis.  Last office visit with Dr. Diona Browner 02/09/2014.  AVS states to follow up in 3 months.  No future appointments scheduled. Ok to refill?

## 2014-07-03 NOTE — Telephone Encounter (Signed)
Pt need 3 month follow up appt, refill until then.

## 2014-07-06 NOTE — Telephone Encounter (Signed)
Left message asking pt to call office  °

## 2014-07-07 ENCOUNTER — Other Ambulatory Visit: Payer: Self-pay | Admitting: Family Medicine

## 2014-07-10 MED ORDER — PRAVASTATIN SODIUM 40 MG PO TABS: ORAL_TABLET | ORAL | Status: DC

## 2014-07-10 MED ORDER — LISINOPRIL 20 MG PO TABS: ORAL_TABLET | ORAL | Status: DC

## 2014-07-10 MED ORDER — SITAGLIPTIN PHOSPHATE 100 MG PO TABS: 100.0000 mg | ORAL_TABLET | Freq: Every day | ORAL | Status: DC

## 2014-07-10 NOTE — Addendum Note (Signed)
Addended by: Carter Kitten on: 07/10/2014 11:15 AM   Modules accepted: Orders

## 2014-07-10 NOTE — Addendum Note (Signed)
Addended by: Carter Kitten on: 07/10/2014 11:16 AM   Modules accepted: Orders

## 2014-07-10 NOTE — Telephone Encounter (Signed)
Pt called back made appointment for 6/28

## 2014-07-11 ENCOUNTER — Other Ambulatory Visit: Payer: Self-pay | Admitting: Family Medicine

## 2014-07-31 ENCOUNTER — Ambulatory Visit: Payer: BLUE CROSS/BLUE SHIELD | Admitting: Family Medicine

## 2014-08-02 ENCOUNTER — Ambulatory Visit (INDEPENDENT_AMBULATORY_CARE_PROVIDER_SITE_OTHER): Payer: BLUE CROSS/BLUE SHIELD | Admitting: Family Medicine

## 2014-08-02 ENCOUNTER — Encounter: Payer: Self-pay | Admitting: Family Medicine

## 2014-08-02 VITALS — BP 124/70 | HR 93 | Temp 98.3°F | Ht 66.5 in | Wt 179.5 lb

## 2014-08-02 DIAGNOSIS — E119 Type 2 diabetes mellitus without complications: Secondary | ICD-10-CM

## 2014-08-02 DIAGNOSIS — I10 Essential (primary) hypertension: Secondary | ICD-10-CM | POA: Diagnosis not present

## 2014-08-02 DIAGNOSIS — E785 Hyperlipidemia, unspecified: Secondary | ICD-10-CM | POA: Diagnosis not present

## 2014-08-02 LAB — HM DIABETES FOOT EXAM

## 2014-08-02 MED ORDER — METFORMIN HCL ER 500 MG PO TB24: 500.0000 mg | ORAL_TABLET | Freq: Every day | ORAL | Status: DC

## 2014-08-02 MED ORDER — LISINOPRIL 20 MG PO TABS: ORAL_TABLET | ORAL | Status: DC

## 2014-08-02 MED ORDER — PRAVASTATIN SODIUM 40 MG PO TABS: ORAL_TABLET | ORAL | Status: DC

## 2014-08-02 MED ORDER — GLIPIZIDE ER 10 MG PO TB24: 10.0000 mg | ORAL_TABLET | Freq: Two times a day (BID) | ORAL | Status: DC

## 2014-08-02 MED ORDER — SITAGLIPTIN PHOSPHATE 100 MG PO TABS: 100.0000 mg | ORAL_TABLET | Freq: Every day | ORAL | Status: DC

## 2014-08-02 NOTE — Patient Instructions (Addendum)
Try to increase exercise as much as you can.   Stop at lab on way out.

## 2014-08-02 NOTE — Progress Notes (Signed)
6 month follow up  Diabetes, Due for re-eval.  On januvia, metformin and glipizide Lab Results  Component Value Date   HGBA1C 8.1* 02/05/2014  Using medications without difficulties:Yes.  Hypoglycemic episodes: None  Hyperglycemic episodes: rare Feet problems: None eye exam within last year: 12/2013 FBS: 95-115, not checking as much lately She had issues with [place].. SE in past.    trying to increase regular exercise. hiking some. Hypertension: Well controlled on lisinopril  BP Readings from Last 3 Encounters:  08/02/14 124/70  06/07/14 128/80  02/09/14 120/70  Using medication without problems or lightheadedness:  Chest pain with exertion: None Edema:None  Short of breath:None  Average home BPs: 120/70  Other issues:   Elevated Cholesterol: Previously at GOAL on pravastatin 40 mg daily.  Recent Labs    Lab Results  Component Value Date   CHOL 156 02/05/2014   HDL 47.10 02/05/2014   LDLCALC 81 02/05/2014   TRIG 140.0 02/05/2014   CHOLHDL 3 02/05/2014    Compliant now with medication.  Using medications without problems: None  Muscle aches: None  Other complaints: hiking some  Review of Systems  Occ intermittant tightness in upper back. Sees massage therapist. NO numbness weakness, no current pain. All other systems reviewed and are negative.  Objective:   Physical Exam  Constitutional: Vital signs are normal. She appears well-developed and well-nourished. She is cooperative. Non-toxic appearance. She does not appear ill. No distress.  HENT:  Head: Normocephalic.  Right Ear: Hearing, tympanic membrane, external ear and ear canal normal. Tympanic membrane is not erythematous, not retracted and not bulging.  Left Ear: Hearing, tympanic membrane, external ear and ear canal normal. Tympanic membrane is not erythematous, not retracted and not bulging.  Nose: No mucosal edema or rhinorrhea. Right sinus exhibits no maxillary  sinus tenderness and no frontal sinus tenderness. Left sinus exhibits no maxillary sinus tenderness and no frontal sinus tenderness.  Mouth/Throat: Uvula is midline, oropharynx is clear and moist and mucous membranes are normal.  Eyes: Conjunctivae, EOM and lids are normal. Pupils are equal, round, and reactive to light. No foreign bodies found.  Neck: Trachea normal and normal range of motion. Neck supple. Carotid bruit is not present. No mass and no thyromegaly present.  Cardiovascular: Normal rate, regular rhythm, S1 normal, S2 normal, normal heart sounds, intact distal pulses and normal pulses. Exam reveals no gallop and no friction rub.  No murmur heard.  Pulmonary/Chest: Effort normal and breath sounds normal. Not tachypneic. No respiratory distress. She has no decreased breath sounds. She has no wheezes. She has no rhonchi. She has no rales.  Abdominal: Soft. Normal appearance and bowel sounds are normal. There is no tenderness.  Neurological: She is alert.  Skin: Skin is warm, dry and intact. No rash noted.  Psychiatric: Her speech is normal and behavior is normal. Judgment and thought content normal. Her mood appears not anxious. Cognition and memory are normal. She does not exhibit a depressed mood.  MSK: full ROM in neck and B shoulders, neg spurling, no ttp  Diabetic foot exam:  No skin breakdown  No calluses  Normal DP pulses  Normal sensation to light touch and monofilament  Nails normal

## 2014-08-02 NOTE — Progress Notes (Signed)
Pre visit review using our clinic review tool, if applicable. No additional management support is needed unless otherwise documented below in the visit note. 

## 2014-08-03 LAB — HEMOGLOBIN A1C: Hgb A1c MFr Bld: 8.5 % — ABNORMAL HIGH (ref 4.6–6.5)

## 2014-08-09 ENCOUNTER — Telehealth: Payer: Self-pay

## 2014-08-09 NOTE — Telephone Encounter (Signed)
Called Jean Robinson this morning and advised her of Dr. Rometta Emery comments. Pt will call and check with insurance first before having the [place] filled.

## 2014-08-13 NOTE — Telephone Encounter (Signed)
Amy from [facility] said pt is wanting to know what to do about possible new med; Amy said Anastasio Auerbach is covered at a Tier 3 and cost to pt would be $65.00 but there is a Invokana savings card that would bring co pay to $20.00; pt has been paying $ 5.00 copay for Januvia.  Amy suggest contacting pt after Dr Diona Browner reviews whether pt should take Invokana or not; Amy request cb when decision made what medication pt will be taking.

## 2014-08-14 NOTE — Telephone Encounter (Signed)
Sounds like invokana is not that expensive with the card.. What does pt want to do?

## 2014-08-14 NOTE — Telephone Encounter (Signed)
Left message for Ms. Mcgillis to return my call.

## 2014-08-15 MED ORDER — CANAGLIFLOZIN 100 MG PO TABS: 100.0000 mg | ORAL_TABLET | Freq: Every day | ORAL | Status: DC

## 2014-08-15 NOTE — Telephone Encounter (Signed)
Jean Robinson walked in to office.  Spoke with her about the cost of Invokana that Jean Robinson with [facility] had called about.  She is good with the $20 co-pay with savings card.  Rx for Invokana 100 mg take one tablet daily sent in to [facility].  Jean Robinson wanted to know if she is suppose to also keep taking the Jean Robinson and the Jean Robinson.  I advised that she should continue with all current medication in addition to adding the Invokana but I would also send Dr. Diona Browner a note to verify that information  Please advise.

## 2014-08-15 NOTE — Addendum Note (Signed)
Addended by: Carter Kitten on: 08/15/2014 01:01 PM   Modules accepted: Orders

## 2014-08-16 NOTE — Telephone Encounter (Signed)
Yes, continue with other meds as well.

## 2014-08-16 NOTE — Telephone Encounter (Signed)
Left message for Jean Robinson to continue all current medication.  The Invokana is an additional medication we are adding to her current regimen.

## 2014-08-23 NOTE — Assessment & Plan Note (Signed)
Due for re-eval. Previously at goal on pravastatin.

## 2014-08-23 NOTE — Assessment & Plan Note (Signed)
Well controlled. Continue current medication. Encouraged exercise, weight loss, healthy eating habits.  

## 2014-08-23 NOTE — Assessment & Plan Note (Signed)
Due for repeat A1c for re-eval. On januvia, metformin and glipizide. Likely needs additional treatment.

## 2014-08-29 ENCOUNTER — Other Ambulatory Visit: Payer: Self-pay | Admitting: Family Medicine

## 2014-10-02 ENCOUNTER — Other Ambulatory Visit: Payer: Self-pay | Admitting: Family Medicine

## 2014-10-29 ENCOUNTER — Encounter (HOSPITAL_COMMUNITY): Payer: Self-pay | Admitting: Cardiology

## 2014-10-29 ENCOUNTER — Telehealth: Payer: Self-pay | Admitting: Family Medicine

## 2014-10-29 ENCOUNTER — Inpatient Hospital Stay (HOSPITAL_COMMUNITY)
Admission: EM | Admit: 2014-10-29 | Discharge: 2014-10-31 | DRG: 391 | Disposition: A | Discharge status: Home / Self Care | Payer: BLUE CROSS/BLUE SHIELD | Attending: Internal Medicine | Admitting: Internal Medicine

## 2014-10-29 DIAGNOSIS — E7800 Pure hypercholesterolemia, unspecified: Secondary | ICD-10-CM | POA: Diagnosis present

## 2014-10-29 DIAGNOSIS — Z87442 Personal history of urinary calculi: Secondary | ICD-10-CM

## 2014-10-29 DIAGNOSIS — R571 Hypovolemic shock: Secondary | ICD-10-CM | POA: Diagnosis present

## 2014-10-29 DIAGNOSIS — Z7982 Long term (current) use of aspirin: Secondary | ICD-10-CM | POA: Diagnosis not present

## 2014-10-29 DIAGNOSIS — Z79899 Other long term (current) drug therapy: Secondary | ICD-10-CM

## 2014-10-29 DIAGNOSIS — E860 Dehydration: Secondary | ICD-10-CM

## 2014-10-29 DIAGNOSIS — Z9049 Acquired absence of other specified parts of digestive tract: Secondary | ICD-10-CM | POA: Diagnosis present

## 2014-10-29 DIAGNOSIS — R197 Diarrhea, unspecified: Secondary | ICD-10-CM

## 2014-10-29 DIAGNOSIS — A419 Sepsis, unspecified organism: Secondary | ICD-10-CM | POA: Diagnosis not present

## 2014-10-29 DIAGNOSIS — E872 Acidosis: Secondary | ICD-10-CM | POA: Diagnosis present

## 2014-10-29 DIAGNOSIS — E876 Hypokalemia: Secondary | ICD-10-CM | POA: Diagnosis not present

## 2014-10-29 DIAGNOSIS — R112 Nausea with vomiting, unspecified: Secondary | ICD-10-CM | POA: Diagnosis not present

## 2014-10-29 DIAGNOSIS — F3181 Bipolar II disorder: Secondary | ICD-10-CM

## 2014-10-29 DIAGNOSIS — E119 Type 2 diabetes mellitus without complications: Secondary | ICD-10-CM

## 2014-10-29 DIAGNOSIS — I10 Essential (primary) hypertension: Secondary | ICD-10-CM | POA: Diagnosis present

## 2014-10-29 DIAGNOSIS — E785 Hyperlipidemia, unspecified: Secondary | ICD-10-CM | POA: Diagnosis present

## 2014-10-29 DIAGNOSIS — E1165 Type 2 diabetes mellitus with hyperglycemia: Secondary | ICD-10-CM | POA: Diagnosis present

## 2014-10-29 DIAGNOSIS — Z9889 Other specified postprocedural states: Secondary | ICD-10-CM

## 2014-10-29 DIAGNOSIS — E1169 Type 2 diabetes mellitus with other specified complication: Secondary | ICD-10-CM | POA: Diagnosis present

## 2014-10-29 DIAGNOSIS — A084 Viral intestinal infection, unspecified: Secondary | ICD-10-CM | POA: Diagnosis present

## 2014-10-29 DIAGNOSIS — B009 Herpesviral infection, unspecified: Secondary | ICD-10-CM | POA: Diagnosis present

## 2014-10-29 LAB — DIFFERENTIAL
BASOS PCT: 0 %
Basophils Absolute: 0 10*3/uL (ref 0.0–0.1)
EOS ABS: 0 10*3/uL (ref 0.0–0.7)
Eosinophils Relative: 0 %
LYMPHS PCT: 4 %
Lymphs Abs: 0.7 10*3/uL (ref 0.7–4.0)
MONOS PCT: 3 %
Monocytes Absolute: 0.5 10*3/uL (ref 0.1–1.0)
NEUTROS PCT: 93 %
Neutro Abs: 17.5 10*3/uL — ABNORMAL HIGH (ref 1.7–7.7)

## 2014-10-29 LAB — CBC
HEMATOCRIT: 47.2 % — AB (ref 36.0–46.0)
HEMOGLOBIN: 16.6 g/dL — AB (ref 12.0–15.0)
MCH: 33.5 pg (ref 26.0–34.0)
MCHC: 35.2 g/dL (ref 30.0–36.0)
MCV: 95.4 fL (ref 78.0–100.0)
Platelets: 132 10*3/uL — ABNORMAL LOW (ref 150–400)
RBC: 4.95 MIL/uL (ref 3.87–5.11)
RDW: 12.3 % (ref 11.5–15.5)
WBC: 19 10*3/uL — AB (ref 4.0–10.5)

## 2014-10-29 LAB — COMPREHENSIVE METABOLIC PANEL
ALBUMIN: 4.4 g/dL (ref 3.5–5.0)
ALT: 35 U/L (ref 14–54)
ANION GAP: 15 (ref 5–15)
AST: 28 U/L (ref 15–41)
Alkaline Phosphatase: 79 U/L (ref 38–126)
BILIRUBIN TOTAL: 1 mg/dL (ref 0.3–1.2)
BUN: 16 mg/dL (ref 6–20)
CO2: 19 mmol/L — ABNORMAL LOW (ref 22–32)
Calcium: 10.1 mg/dL (ref 8.9–10.3)
Chloride: 103 mmol/L (ref 101–111)
Creatinine, Ser: 0.82 mg/dL (ref 0.44–1.00)
Glucose, Bld: 328 mg/dL — ABNORMAL HIGH (ref 65–99)
POTASSIUM: 4.6 mmol/L (ref 3.5–5.1)
Sodium: 137 mmol/L (ref 135–145)
TOTAL PROTEIN: 7.5 g/dL (ref 6.5–8.1)

## 2014-10-29 LAB — I-STAT CG4 LACTIC ACID, ED
LACTIC ACID, VENOUS: 2.84 mmol/L — AB (ref 0.5–2.0)
LACTIC ACID, VENOUS: 2.44 mmol/L — AB (ref 0.5–2.0)

## 2014-10-29 LAB — URINALYSIS, ROUTINE W REFLEX MICROSCOPIC
Bilirubin Urine: NEGATIVE
Glucose, UA: 500 mg/dL — AB
Hgb urine dipstick: NEGATIVE
Ketones, ur: NEGATIVE mg/dL
NITRITE: NEGATIVE
PROTEIN: NEGATIVE mg/dL
SPECIFIC GRAVITY, URINE: 1.022 (ref 1.005–1.030)
UROBILINOGEN UA: 0.2 mg/dL (ref 0.0–1.0)
pH: 5 (ref 5.0–8.0)

## 2014-10-29 LAB — LIPASE, BLOOD: Lipase: 33 U/L (ref 22–51)

## 2014-10-29 LAB — PROCALCITONIN: PROCALCITONIN: 1.53 ng/mL

## 2014-10-29 LAB — URINE MICROSCOPIC-ADD ON

## 2014-10-29 MED ORDER — SODIUM CHLORIDE 0.9 % IV BOLUS (SEPSIS)
1000.0000 mL | Freq: Once | INTRAVENOUS | Status: AC
  Administered 2014-10-29: 1000 mL via INTRAVENOUS

## 2014-10-29 MED ORDER — INSULIN ASPART 100 UNIT/ML ~~LOC~~ SOLN
0.0000 [IU] | SUBCUTANEOUS | Status: DC
  Administered 2014-10-30 (×3): 1 [IU] via SUBCUTANEOUS
  Administered 2014-10-30: 3 [IU] via SUBCUTANEOUS
  Administered 2014-10-30 – 2014-10-31 (×2): 2 [IU] via SUBCUTANEOUS

## 2014-10-29 MED ORDER — ONDANSETRON HCL 4 MG PO TABS: 4.0000 mg | ORAL_TABLET | Freq: Four times a day (QID) | ORAL | Status: DC | PRN

## 2014-10-29 MED ORDER — ONDANSETRON HCL 4 MG/2ML IJ SOLN: 4.0000 mg | Freq: Four times a day (QID) | INTRAMUSCULAR | Status: DC | PRN

## 2014-10-29 MED ORDER — ONDANSETRON HCL 4 MG/2ML IJ SOLN
4.0000 mg | Freq: Once | INTRAMUSCULAR | Status: AC
  Administered 2014-10-29: 4 mg via INTRAVENOUS
  Filled 2014-10-29: qty 2

## 2014-10-29 MED ORDER — ENOXAPARIN SODIUM 40 MG/0.4ML ~~LOC~~ SOLN
40.0000 mg | SUBCUTANEOUS | Status: DC
  Administered 2014-10-30 – 2014-10-31 (×2): 40 mg via SUBCUTANEOUS
  Filled 2014-10-29 (×2): qty 0.4

## 2014-10-29 MED ORDER — PROMETHAZINE HCL 25 MG/ML IJ SOLN
25.0000 mg | Freq: Once | INTRAMUSCULAR | Status: DC
  Filled 2014-10-29: qty 1

## 2014-10-29 MED ORDER — SERTRALINE HCL 25 MG PO TABS
25.0000 mg | ORAL_TABLET | Freq: Every day | ORAL | Status: DC
  Administered 2014-10-30 – 2014-10-31 (×2): 25 mg via ORAL
  Filled 2014-10-29 (×2): qty 1

## 2014-10-29 MED ORDER — VALACYCLOVIR HCL 500 MG PO TABS
500.0000 mg | ORAL_TABLET | Freq: Every day | ORAL | Status: DC
  Administered 2014-10-30 – 2014-10-31 (×2): 500 mg via ORAL
  Filled 2014-10-29 (×2): qty 1

## 2014-10-29 MED ORDER — SODIUM CHLORIDE 0.9 % IV SOLN
INTRAVENOUS | Status: DC
  Administered 2014-10-29: 200 mL/h via INTRAVENOUS
  Administered 2014-10-30 (×2): via INTRAVENOUS

## 2014-10-29 NOTE — Telephone Encounter (Signed)
Patient Name: Jean Robinson DOB: 08/12/1952 Initial Comment caller states his wife has been exposed to the flu - he needs to know what to do? She has not energy this am - chills, vomiting diarrhea- husband is concerned due to ther being a diabetic Nurse Assessment Nurse: Vallery Sa, RN, [facility] [place] Date/Time (Eastern Time): 10/29/2014 11:58:11 AM Confirm and document reason for call. If symptomatic, describe symptoms. ---Caller states she has had about 8-10 episodes of vomiting and 8-10 episodes of diarrhea since 7am. Her blood sugar was 268 this morning. No fever. No sore throat, cold and cough symptoms. Has the patient traveled out of the country within the last 30 days? ---No Does the patient require triage? ---Yes Related visit to physician within the last 2 weeks? ---No Does the PT have any chronic conditions? (i.e. diabetes, asthma, etc.) ---Yes List chronic conditions. ---Diabetes, High Blood Pressure, Anxiety Guidelines Guideline Title Affirmed Question Affirmed Notes Diabetes - High Blood Sugar [1] Blood glucose > 240 mg/dl (13 mmol/l) AND [2] vomiting AND [3] unable to check for ketones (in blood or urine) Final Disposition User Go to ED Now (or PCP triage) Vallery Sa, RN, [facility] - ED Disagree/Comply: Comply

## 2014-10-29 NOTE — Progress Notes (Signed)
Pt arrived from ED about 2130.  Pt walked to bed, states she feels great.  VS's are stable, mild temp at 99.5, HR in 120's, pt is aware of her HR but feels no palpitations.  Pt is fully AO x 4.  RN will continue to monitor.

## 2014-10-29 NOTE — H&P (Signed)
[facility] History and Physical  Jean Robinson IDP:[id number] DOB: 1955/08/08 DOA: 10/29/2014  59 y/o ? DM ty2, HbA1c 8.5 in June 2016 HLD Depression HTN History of renal calculi History anal condylomata status post excision 08/29/2007  Usual state of health until 10/29/14 a.m.  Slept with grandchild in bed who was stiffly but did not have any illness - had been exposed to a family friend on 10/27/14 who had severe food poisoning like symptoms Family friend then proceeded to note that his sister and grandfather had the same symptoms.  At around 7 AM started to have Explosive diarrhea-"20 times" Also started to have vomiting, clear food mixed Stool was nonbloody and formed rice water like No tenesmus Had abdominal cramping but no abdominal pain,, no dysuria Not been exposed antibiotics No new medications  No chest pain  No rash  No fever noted at any time although felt warm  No cough no cold no sputum  Does have a headache currently and feels like a sinus headache is coming on   No blurred vision no double vision No unilateral weakness No falls Progressively felt weaker however with intensity of diarrhea increasing.   Past Medical History  Diagnosis Date  . Diabetes mellitus   . Hyperlipidemia   . Hypertension   . Allergy   . STD (sexually transmitted disease)     HSV I & II, IgG reflex positive  . H/O renal calculi 10/08/09   Past Surgical History  Procedure Laterality Date  . Appendectomy    . Cholecystectomy    . Tonsillectomy     Social History:  Social History   Social History Narrative   Regular exercise --yes, treadmill 15 min 3 days per week    Originally from [place] and moved here 30 years ago and went to college at [employer] with a degree in the child education   Occasional drinker and nonsmoker   Has 4 children   Lives with her second husband    No Known Allergies  Family History  Problem Relation Age of Onset  .  COPD Mother   . Diabetes Mother   . Macular degeneration Mother   . Depression Father   . Diabetes Father   . Diabetes Brother   . Cancer Other     liver  . Diabetes Other     Prior to Admission medications   Medication Sig Start Date End Date Taking? Authorizing Provider  aspirin 81 MG tablet Take 81 mg by mouth daily.     Yes Historical Provider, MD  canagliflozin (INVOKANA) 100 MG TABS tablet Take 1 tablet (100 mg total) by mouth daily. 08/15/14  Yes Amy Cletis Athens, MD  glipiZIDE (GLUCOTROL XL) 10 MG 24 hr tablet Take 1 tablet (10 mg total) by mouth 2 (two) times daily. 08/02/14  Yes Amy E Bedsole, MD  lisinopril (PRINIVIL,ZESTRIL) 20 MG tablet TAKE 1 TABLET BY MOUTH DAILY 08/02/14  Yes Amy E Bedsole, MD  metFORMIN (GLUCOPHAGE-XR) 500 MG 24 hr tablet Take 1,000 mg by mouth 2 (two) times daily.   Yes Historical Provider, MD  pravastatin (PRAVACHOL) 40 MG tablet TAKE 1 TABLET BY MOUTH DAILY 08/02/14  Yes Amy E Bedsole, MD  sertraline (ZOLOFT) 25 MG tablet TAKE 1 TABLET BY MOUTH DAILY 08/29/14  Yes Amy E Bedsole, MD  sitaGLIPtin (JANUVIA) 100 MG tablet Take 1 tablet (100 mg total) by mouth daily. 08/02/14  Yes Amy Cletis Athens, MD  valACYclovir (VALTREX) 500 MG  tablet TAKE 1 TABLET BY MOUTH DAILY 11/09/13  Yes Kem Boroughs, FNP   Physical Exam: Filed Vitals:   10/29/14 1700 10/29/14 1800 10/29/14 1936 10/29/14 2000  BP: 144/83 123/69 116/73 131/64  Pulse: 122 124 131 133  Temp:      TempSrc:      Resp:  14 16 18   Weight:      SpO2: 91% 89% 90% 97%    HR 130   On exam Pleasant oriented no apparent distress Dry mucosa Poor dentition JVD flat No bruit Tachycardic S1-S2 in sinus Abdomen soft slightly distended No lower extremity edema No rales no rhonchi Power 5/5 bilaterally in hip ankle and knee flexors and extensors Alert oriented 3   Labs on Admission:  Basic Metabolic Panel:  Recent Labs Lab 10/29/14 1458  NA 137  K 4.6  CL 103  CO2 19*  GLUCOSE 328*  BUN 16   CREATININE 0.82  CALCIUM 10.1   Liver Function Tests:  Recent Labs Lab 10/29/14 1458  AST 28  ALT 35  ALKPHOS 79  BILITOT 1.0  PROT 7.5  ALBUMIN 4.4    Recent Labs Lab 10/29/14 1458  LIPASE 33   No results for input(s): AMMONIA in the last 168 hours. CBC:  Recent Labs Lab 10/29/14 1445 10/29/14 1458  WBC  --  19.0*  NEUTROABS 17.5*  --   HGB  --  16.6*  HCT  --  47.2*  MCV  --  95.4  PLT  --  132*   Cardiac Enzymes: No results for input(s): CKTOTAL, CKMB, CKMBINDEX, TROPONINI in the last 168 hours.  BNP (last 3 results) No results for input(s): BNP in the last 8760 hours.  ProBNP (last 3 results) No results for input(s): PROBNP in the last 8760 hours.  CBG: No results for input(s): GLUCAP in the last 168 hours.  Radiological Exams on Admission: No results found.  EKG: Independently reviewed. None performed nor warranted  Assessment/Plan Active Problems:   Diabetes mellitus with no complication   Hyperlipidemia LDL goal <100   Essential hypertension, benign   Hypovolemic shock   Bipolar 2 disorder, major depressive episode  Distributive/hypovolemic shock secondary to probable gastroenteritis -Has already received 3 L fluid but has responded well with blood pressure going from 100 range to 1:77 range systolic -Cycle lactic acid, obtain Pro calcitonin to guide antibiotics choices however would not add any right now -She will have CBC and differential in the morning as well as complete metabolic panel -Saline to continue at 200 cc an hour until a.m. -She'll be monitored on step down unit for now  Viral gastroenteritis -Unlikely to be infected urine has no symptomatology and setting seems more like gastroenteritis -Patient will need contact isolation for now -I will obtain a C. difficile to rule out more obvious causes and have deferred to getting a GI pathogen panel as her symptomatology may resolve even prior to this -She can be placed on clear  liquid diet with small sips and will need antiemetics such as Zofran if continues to have vomiting -Hold antibiotics -If fever spike or concern for flank pain or abdominal pain, reasonable to start Rocephin however once again this has been held   Metabolic acidosis -Potentially type a but also could be type B because of use of metformin -Bicarbonate is 19 we will cycle in the a.m.  Diabetes mellitus type 2, recent A1c 8.5 -Glipizide 10 mg twice a day on hold,  Invokana [can cause vomiting] has been  held -Metformin 1000 twice a day has been held -She will need sliding scale coverage in the a.m.  Hypertension -Continue lisinopril 20 mg when able to take by po'  Hyperlipidemia -Continue Pravachol 40 daily when able to take by mouth   Peterson Ao, [facility] Pager [phone]  If 7PM-7AM, please contact night-coverage [email or web address] Password [email or web address] 10/29/2014, 9:00 PM

## 2014-10-29 NOTE — ED Notes (Signed)
Pt reports abd pain, n/v/d that started this morning. Reports she has been unable to keep anything down and had muliplte episodes of diarrhea.

## 2014-10-29 NOTE — ED Provider Notes (Signed)
CSN: [id number]     Arrival date & time 10/29/14  1249 History   First MD Initiated Contact with Patient 10/29/14 1359     Chief Complaint  Patient presents with  . Abdominal Pain  . Diarrhea     (Consider location/radiation/quality/duration/timing/severity/associated sxs/prior Treatment) HPI Jean Robinson is a 59 yo female with DMII and HTN, presenting with one day h/o nausea, vomiting, and diarrhea.  Patient reports sudden onset of all symptoms this morning, with multiple episodes (15+) of nonbloody vomiting and diarrhea. Patient states she has been unable to eat or drink, or take her medications.  She endorses chills, lightheadedness, weakness, and decreased urinary frequency.  She was at a family member's house on Saturday, where a family member was having similar symptoms of N/V and diarrhea requiring admission to the hospital.  She has not taken her medications today.  She denies fever, CP, SOB, dysuria, or leg swelling.   Past Medical History  Diagnosis Date  . Diabetes mellitus   . Hyperlipidemia   . Hypertension   . Allergy   . STD (sexually transmitted disease)     HSV I & II, IgG reflex positive  . H/O renal calculi 10/08/09   Past Surgical History  Procedure Laterality Date  . Appendectomy    . Cholecystectomy    . Tonsillectomy     Family History  Problem Relation Age of Onset  . COPD Mother   . Diabetes Mother   . Macular degeneration Mother   . Depression Father   . Diabetes Father   . Diabetes Brother   . Cancer Other     liver  . Diabetes Other    Social History  Substance Use Topics  . Smoking status: Never Smoker   . Smokeless tobacco: Never Used  . Alcohol Use: Yes     Comment: occassionally   OB History    Gravida Para Term Preterm AB TAB SAB Ectopic Multiple Living   4 4 4       4      Review of Systems  Constitutional: Positive for chills and fatigue. Negative for fever and diaphoresis.  HENT: Positive for congestion and rhinorrhea.   Eyes:  Negative for photophobia.  Respiratory: Negative for cough, chest tightness, shortness of breath and wheezing.   Cardiovascular: Negative for chest pain, palpitations and leg swelling.  Gastrointestinal: Positive for nausea, vomiting, abdominal pain and diarrhea. Negative for constipation.  Endocrine: Negative for polyuria.  Genitourinary: Positive for decreased urine volume. Negative for dysuria, frequency and difficulty urinating.  Musculoskeletal: Positive for back pain.  Skin: Negative for rash.  Neurological: Positive for weakness and light-headedness. Negative for dizziness and headaches.      Allergies  Review of patient's allergies indicates no known allergies.  Home Medications   Prior to Admission medications   Medication Sig Start Date End Date Taking? Authorizing Provider  aspirin 81 MG tablet Take 81 mg by mouth daily.      Historical Provider, MD  canagliflozin (INVOKANA) 100 MG TABS tablet Take 1 tablet (100 mg total) by mouth daily. 08/15/14   Amy Cletis Athens, MD  glipiZIDE (GLUCOTROL XL) 10 MG 24 hr tablet Take 1 tablet (10 mg total) by mouth 2 (two) times daily. 08/02/14   Jinny Sanders, MD  Lancet Device MISC by Does not apply route. Check blood sugar  1-2 times per day dx250.0     Historical Provider, MD  Lancets [facility] ULTRASOFT) lancets Use to check blood sugars twice a day.  Dx: 250.00 11/30/13   Amy E Diona Browner, MD  lisinopril (PRINIVIL,ZESTRIL) 20 MG tablet TAKE 1 TABLET BY MOUTH DAILY 08/02/14   Amy Cletis Athens, MD  metFORMIN (GLUCOPHAGE-XR) 500 MG 24 hr tablet Take 1,000 mg by mouth 2 (two) times daily.    Historical Provider, MD  ONE TOUCH ULTRA TEST test strip USE TO CHECK BLOOD SUGAR 2 TIMES DAILY AS DIRECTED 10/02/14   Amy E Diona Browner, MD  pravastatin (PRAVACHOL) 40 MG tablet TAKE 1 TABLET BY MOUTH DAILY 08/02/14   Amy E Diona Browner, MD  sertraline (ZOLOFT) 25 MG tablet TAKE 1 TABLET BY MOUTH DAILY 08/29/14   Amy E Diona Browner, MD  sitaGLIPtin (JANUVIA) 100 MG tablet Take 1  tablet (100 mg total) by mouth daily. 08/02/14   Amy Cletis Athens, MD  valACYclovir (VALTREX) 500 MG tablet TAKE 1 TABLET BY MOUTH DAILY 11/09/13   Kem Boroughs, FNP   BP 100/79 mmHg  Pulse 122  Temp(Src) 98 F (36.7 C) (Oral)  Resp 20  Wt 179 lb (81.194 kg)  SpO2 97% Physical Exam  Constitutional: She is oriented to person, place, and time. She appears well-developed and well-nourished. No distress.  HENT:  Head: Normocephalic and atraumatic.  MM dry  Eyes: EOM are normal. No scleral icterus.  Neck: Normal range of motion. No JVD present. No tracheal deviation present.  Cardiovascular: Normal rate, regular rhythm, normal heart sounds and intact distal pulses.   Borderline tachy. Good cap refill  Pulmonary/Chest: Effort normal and breath sounds normal. No respiratory distress. She has no wheezes.  Abdominal: Soft. She exhibits no distension. There is no rebound and no guarding.  Minimally diffusely TTP in all quadrants.  Musculoskeletal: Normal range of motion. She exhibits no edema.  Neurological: She is alert and oriented to person, place, and time.  Skin: Skin is warm and dry. No rash noted. She is not diaphoretic.    ED Course  Procedures (including critical care time) Labs Review Labs Reviewed  URINE CULTURE  LIPASE, BLOOD  COMPREHENSIVE METABOLIC PANEL  CBC  URINALYSIS, ROUTINE W REFLEX MICROSCOPIC (NOT AT [facility])  I-STAT CG4 LACTIC ACID, ED  CBG MONITORING, ED    Imaging Review No results found. I have personally reviewed and evaluated these images and lab results as part of my medical decision-making.   EKG Interpretation None      MDM   Final diagnoses:  None   Gastroenteritis: Likely viral gastro given sick contact. Patient improved with IVF and Zofran.  Patient willing to try to eat. Will provide food and water, with discharge home if patient able to tolerate.  Patient signed out to Dr. Alvino Chapel with possible discharge home if patient passes PO  challenge. - Zofran 4 mg q6hours PRN nausea  Iline Oven, MD 10/30/14 [place], MD 10/31/14 [phone]

## 2014-10-29 NOTE — ED Notes (Signed)
Pt states "I feel fine!" Denies any pain, afebrile. MD aware of HR.

## 2014-10-30 ENCOUNTER — Inpatient Hospital Stay (HOSPITAL_COMMUNITY): Payer: BLUE CROSS/BLUE SHIELD

## 2014-10-30 DIAGNOSIS — E860 Dehydration: Secondary | ICD-10-CM

## 2014-10-30 DIAGNOSIS — R112 Nausea with vomiting, unspecified: Secondary | ICD-10-CM

## 2014-10-30 DIAGNOSIS — R571 Hypovolemic shock: Secondary | ICD-10-CM

## 2014-10-30 DIAGNOSIS — A419 Sepsis, unspecified organism: Secondary | ICD-10-CM

## 2014-10-30 DIAGNOSIS — E1165 Type 2 diabetes mellitus with hyperglycemia: Secondary | ICD-10-CM

## 2014-10-30 DIAGNOSIS — R197 Diarrhea, unspecified: Secondary | ICD-10-CM

## 2014-10-30 LAB — MRSA PCR SCREENING: MRSA by PCR: NEGATIVE

## 2014-10-30 LAB — GLUCOSE, CAPILLARY
GLUCOSE-CAPILLARY: 142 mg/dL — AB (ref 65–99)
GLUCOSE-CAPILLARY: 147 mg/dL — AB (ref 65–99)
GLUCOSE-CAPILLARY: 213 mg/dL — AB (ref 65–99)
GLUCOSE-CAPILLARY: 146 mg/dL — AB (ref 65–99)
Glucose-Capillary: 107 mg/dL — ABNORMAL HIGH (ref 65–99)
Glucose-Capillary: 172 mg/dL — ABNORMAL HIGH (ref 65–99)

## 2014-10-30 LAB — LIPID PANEL
Cholesterol: 115 mg/dL (ref 0–200)
HDL: 34 mg/dL — AB (ref >40)
LDL Cholesterol: 61 mg/dL (ref 0–99)
TRIGLYCERIDES: 100 mg/dL (ref <150)
Total CHOL/HDL Ratio: 3.4 RATIO
VLDL: 20 mg/dL (ref 0–40)

## 2014-10-30 LAB — COMPREHENSIVE METABOLIC PANEL
ALBUMIN: 3.2 g/dL — AB (ref 3.5–5.0)
ALK PHOS: 45 U/L (ref 38–126)
ALT: 25 U/L (ref 14–54)
ANION GAP: 8 (ref 5–15)
AST: 22 U/L (ref 15–41)
BUN: 12 mg/dL (ref 6–20)
CALCIUM: 8.3 mg/dL — AB (ref 8.9–10.3)
CO2: 25 mmol/L (ref 22–32)
Chloride: 106 mmol/L (ref 101–111)
Creatinine, Ser: 0.73 mg/dL (ref 0.44–1.00)
GFR calc Af Amer: >60 mL/min (ref >60)
GFR calc non Af Amer: >60 mL/min (ref >60)
GLUCOSE: 130 mg/dL — AB (ref 65–99)
POTASSIUM: 4.3 mmol/L (ref 3.5–5.1)
SODIUM: 139 mmol/L (ref 135–145)
Total Bilirubin: 0.5 mg/dL (ref 0.3–1.2)
Total Protein: 5.6 g/dL — ABNORMAL LOW (ref 6.5–8.1)

## 2014-10-30 LAB — CBC
HEMATOCRIT: 36.2 % (ref 36.0–46.0)
HEMOGLOBIN: 12.6 g/dL (ref 12.0–15.0)
MCH: 33.4 pg (ref 26.0–34.0)
MCHC: 34.8 g/dL (ref 30.0–36.0)
MCV: 96 fL (ref 78.0–100.0)
Platelets: 208 10*3/uL (ref 150–400)
RBC: 3.77 MIL/uL — ABNORMAL LOW (ref 3.87–5.11)
RDW: 12.5 % (ref 11.5–15.5)
WBC: 10.6 10*3/uL — ABNORMAL HIGH (ref 4.0–10.5)

## 2014-10-30 LAB — LACTIC ACID, PLASMA
LACTIC ACID, VENOUS: 1.3 mmol/L (ref 0.5–2.0)
Lactic Acid, Venous: 2.2 mmol/L (ref 0.5–2.0)

## 2014-10-30 LAB — C DIFFICILE QUICK SCREEN W PCR REFLEX
C DIFFICILE (CDIFF) INTERP: NEGATIVE
C DIFFICILE (CDIFF) TOXIN: NEGATIVE
C DIFFICLE (CDIFF) ANTIGEN: NEGATIVE

## 2014-10-30 LAB — PROCALCITONIN: PROCALCITONIN: 4.45 ng/mL

## 2014-10-30 MED ORDER — PRAVASTATIN SODIUM 40 MG PO TABS
40.0000 mg | ORAL_TABLET | Freq: Every day | ORAL | Status: DC
  Administered 2014-10-30 – 2014-10-31 (×2): 40 mg via ORAL
  Filled 2014-10-30 (×2): qty 1

## 2014-10-30 NOTE — Plan of Care (Signed)
Problem: Phase I Progression Outcomes Goal: OOB as tolerated unless otherwise ordered Outcome: Completed/Met Date Met:  10/30/14 Pt moves independently and has no weakness or dizziness.

## 2014-10-30 NOTE — Progress Notes (Signed)
Report given to RN on 5W. Patient will be transferred after shift change.

## 2014-10-30 NOTE — Progress Notes (Signed)
Pt had first episode of diarrhea since arriving to the hospital.  Very watery, sample was collected, pt states she feels fine. RN will continue to monitor.

## 2014-10-30 NOTE — Progress Notes (Signed)
Echocardiogram 2D Echocardiogram has been performed.  Tresa Res 10/30/2014, 12:43 PM

## 2014-10-30 NOTE — Progress Notes (Signed)
Utilization Review Completed.Dowell, Deborah T9/27/2016  

## 2014-10-30 NOTE — Progress Notes (Signed)
CRITICAL VALUE ALERT  Critical value received:  Lactic Acid 2.2  Date of notification:  10/30/2014  Time of notification:  1100  Critical value read back:Yes.    Nurse who received alert:  Pieter Partridge, RN  MD notified (1st page):  Dr. Sherral Hammers (notified in person)  Time of first page:  73  MD notified (2nd page): na  Time of second page:na  Responding MD:  Dr. Sherral Hammers  Time MD responded:  1140

## 2014-10-30 NOTE — Progress Notes (Signed)
[place] TEAM 1 - Stepdown/ICU TEAM Progress Note  Jean Robinson KXF:[id number] DOB: [birth year] DOA: 10/29/2014 PCP: Eliezer Lofts, MD  Admit HPI / Brief Narrative: 59 yo WF PMHx Depression, Bipolar 2 DO, DM Type 2 uncontrolled (HbA1c 8.5 in June 2016),HLD, HTN,History of renal calculi, History anal condylomata status post excision 08/29/2007. Slept with grandchild in bed who was stiffly but did not have any illness - had been exposed to a family friend on 10/27/14 who had severe food poisoning like symptoms Family friend then proceeded to note that his sister and grandfather had the same symptoms.  At around 7 AM started to have Explosive diarrhea-"20 times" Also started to have vomiting, clear food mixed Stool was nonbloody and formed rice water like No tenesmus Had abdominal cramping but no abdominal pain,, no dysuria Not been exposed antibiotics No new medications  No chest pain  No rash  No fever noted at any time although felt warm  No cough no cold no sputum  Does have a headache currently and feels like a sinus headache is coming on   No blurred vision no double vision No unilateral weakness No falls Progressively felt weaker however with intensity of diarrhea increasing.   HPI/Subjective: 9/27 A/O 4, NAD, negative N/VD, negative CP, negative SOB  Assessment/Plan: Distributive/hypovolemic shock secondary to probable gastroenteritis -Received 3 L fluid responded BP going from 100 range to 696 range systolic -Lactic Acid Elevated but trending dn  - Hold ABx for now  -Decrease Saline to continue at 100 ml/hr -Echocardiogram pending   Viral gastroenteritis -Unlikely to be infected urine has no symptomatology and setting seems more like gastroenteritis -Patient will need contact isolation for now -I will obtain a C. difficile to rule out more obvious causes and have deferred to getting a GI pathogen panel as her symptomatology may resolve even prior to this -She can be  placed on clear liquid diet with small sips and will need antiemetics such as Zofran if continues to have vomiting -Hold antibiotics -If fever spike or concern for flank pain or abdominal pain, reasonable to start Rocephin however once again this has been held   Diabetes mellitus Type 2 Uncontrolled  -HbA1c 8.5 in June 2016 -Glipizide 10 mg twice a day on hold,  -Invokana [can cause vomiting] has been held -Metformin 1000 twice a day has been held -Sensitive SSI  HLD  -Continue Pravachol 40  Mg Daily -Lipid Panel Pending    Essential hypertension, benign - Actually admitted Hypotensive; Hold all BP meds  Bipolar 2 disorder/major depressive episode - Pt only on Zoloft continue 25 mg Daily       Code Status: FULL Family Communication: Husband  present at time of exam Disposition Plan: DC in a.m. if continued stability    Consultants:   Procedure/Significant Events:    Culture 9/26 urine pending 9/26 MRSA by PCR negative  Antibiotics:   DVT prophylaxis: Lovenox   Devices   LINES / TUBES:      Continuous Infusions: . sodium chloride 200 mL/hr at 10/30/14 0640    Objective: VITAL SIGNS: Temp: 98.2 F (36.8 C) (09/27 1550) Temp Source: Oral (09/27 1550) BP: 117/61 mmHg (09/27 1550) Pulse Rate: 101 (09/27 1550) SPO2; FIO2:   Intake/Output Summary (Last 24 hours) at 10/30/14 1822 Last data filed at 10/30/14 1639  Gross per 24 hour  Intake   6400 ml  Output   1952 ml  Net   4448 ml     Exam: General: A/O  4, NAD, No acute respiratory distress Eyes: Negative headache, eye pain, double vision,negative scleral hemorrhage ENT: Negative Runny nose, negative ear pain, negative gingival bleeding, Neck:  Negative scars, masses, torticollis, lymphadenopathy, JVD Lungs: Clear to auscultation bilaterally without wheezes or crackles Cardiovascular: Regular rate and rhythm without murmur gallop or rub normal S1 and S2 Abdomen:negative abdominal  pain, nondistended, positive soft, bowel sounds, no rebound, no ascites, no appreciable mass Extremities: No significant cyanosis, clubbing, or edema bilateral lower extremities Psychiatric:  Negative depression, negative anxiety, negative fatigue, negative mania  Neurologic:  Cranial nerves II through XII intact, tongue/uvula midline, all extremities muscle strength 5/5, sensation intact throughout,  negative dysarthria, negative expressive aphasia, negative receptive aphasia.   Data Reviewed: Basic Metabolic Panel:  Recent Labs Lab 10/29/14 1458 10/30/14 0333  NA 137 139  K 4.6 4.3  CL 103 106  CO2 19* 25  GLUCOSE 328* 130*  BUN 16 12  CREATININE 0.82 0.73  CALCIUM 10.1 8.3*   Liver Function Tests:  Recent Labs Lab 10/29/14 1458 10/30/14 0333  AST 28 22  ALT 35 25  ALKPHOS 79 45  BILITOT 1.0 0.5  PROT 7.5 5.6*  ALBUMIN 4.4 3.2*    Recent Labs Lab 10/29/14 1458  LIPASE 33   No results for input(s): AMMONIA in the last 168 hours. CBC:  Recent Labs Lab 10/29/14 1445 10/29/14 1458 10/30/14 0333  WBC  --  19.0* 10.6*  NEUTROABS 17.5*  --   --   HGB  --  16.6* 12.6  HCT  --  47.2* 36.2  MCV  --  95.4 96.0  PLT  --  132* 208   Cardiac Enzymes: No results for input(s): CKTOTAL, CKMB, CKMBINDEX, TROPONINI in the last 168 hours. BNP (last 3 results) No results for input(s): BNP in the last 8760 hours.  ProBNP (last 3 results) No results for input(s): PROBNP in the last 8760 hours.  CBG:  Recent Labs Lab 10/30/14 0033 10/30/14 0340 10/30/14 0827 10/30/14 1242 10/30/14 1638  GLUCAP 172* 107* 142* 147* 213*    Recent Results (from the past 240 hour(s))  Urine culture     Status: None (Preliminary result)   Collection Time: 10/29/14  6:30 PM  Result Value Ref Range Status   Specimen Description URINE, CLEAN CATCH  Final   Special Requests NONE  Final   Culture CULTURE REINCUBATED FOR BETTER GROWTH  Final   Report Status PENDING  Incomplete  MRSA  PCR Screening     Status: None   Collection Time: 10/29/14 11:03 PM  Result Value Ref Range Status   MRSA by PCR NEGATIVE NEGATIVE Final    Comment:        The GeneXpert MRSA Assay (FDA approved for NASAL specimens only), is one component of a comprehensive MRSA colonization surveillance program. It is not intended to diagnose MRSA infection nor to guide or monitor treatment for MRSA infections.   C difficile quick scan w PCR reflex     Status: None   Collection Time: 10/30/14  1:26 AM  Result Value Ref Range Status   C Diff antigen NEGATIVE NEGATIVE Final   C Diff toxin NEGATIVE NEGATIVE Final   C Diff interpretation Negative for toxigenic C. difficile  Final     Studies:  Recent x-ray studies have been reviewed in detail by the Attending Physician  Scheduled Meds:  Scheduled Meds: . enoxaparin (LOVENOX) injection  40 mg Subcutaneous Q24H  . insulin aspart  0-9 Units Subcutaneous 6 times per  day  . pravastatin  40 mg Oral Daily  . sertraline  25 mg Oral Daily  . valACYclovir  500 mg Oral Daily    Time spent on care of this patient: 40 mins   WOODS, Geraldo Docker , MD  [facility] Office  [phone] Pager [phone]  On-Call/Text Page:      [email or web address]      password [email or web address]  If 7PM-7AM, please contact night-coverage [email or web address] Password [email or web address] 10/30/2014, 6:22 PM   LOS: 1 day   Care during the described time interval was provided by me .  I have reviewed this patient's available data, including medical history, events of note, physical examination, and all test results as part of my evaluation. I have personally reviewed and interpreted all radiology studies.   Dia Crawford, MD [phone] Pager

## 2014-10-31 LAB — GLUCOSE, CAPILLARY
GLUCOSE-CAPILLARY: 113 mg/dL — AB (ref 65–99)
GLUCOSE-CAPILLARY: 172 mg/dL — AB (ref 65–99)
Glucose-Capillary: 101 mg/dL — ABNORMAL HIGH (ref 65–99)
Glucose-Capillary: 113 mg/dL — ABNORMAL HIGH (ref 65–99)

## 2014-10-31 LAB — COMPREHENSIVE METABOLIC PANEL
ALT: 35 U/L (ref 14–54)
AST: 35 U/L (ref 15–41)
Albumin: 3 g/dL — ABNORMAL LOW (ref 3.5–5.0)
Alkaline Phosphatase: 41 U/L (ref 38–126)
Anion gap: 8 (ref 5–15)
BUN: 5 mg/dL — AB (ref 6–20)
CHLORIDE: 110 mmol/L (ref 101–111)
CO2: 22 mmol/L (ref 22–32)
CREATININE: 0.67 mg/dL (ref 0.44–1.00)
Calcium: 8.4 mg/dL — ABNORMAL LOW (ref 8.9–10.3)
GFR calc Af Amer: >60 mL/min (ref >60)
Glucose, Bld: 109 mg/dL — ABNORMAL HIGH (ref 65–99)
Potassium: 3.3 mmol/L — ABNORMAL LOW (ref 3.5–5.1)
Sodium: 140 mmol/L (ref 135–145)
Total Bilirubin: 0.6 mg/dL (ref 0.3–1.2)
Total Protein: 5.5 g/dL — ABNORMAL LOW (ref 6.5–8.1)

## 2014-10-31 LAB — CBC WITH DIFFERENTIAL/PLATELET
Basophils Absolute: 0 10*3/uL (ref 0.0–0.1)
Basophils Relative: 0 %
EOS ABS: 0.1 10*3/uL (ref 0.0–0.7)
EOS PCT: 2 %
HCT: 34.5 % — ABNORMAL LOW (ref 36.0–46.0)
Hemoglobin: 11.6 g/dL — ABNORMAL LOW (ref 12.0–15.0)
LYMPHS ABS: 1.6 10*3/uL (ref 0.7–4.0)
Lymphocytes Relative: 34 %
MCH: 32.1 pg (ref 26.0–34.0)
MCHC: 33.6 g/dL (ref 30.0–36.0)
MCV: 95.6 fL (ref 78.0–100.0)
MONO ABS: 0.5 10*3/uL (ref 0.1–1.0)
MONOS PCT: 11 %
Neutro Abs: 2.5 10*3/uL (ref 1.7–7.7)
Neutrophils Relative %: 53 %
PLATELETS: 177 10*3/uL (ref 150–400)
RBC: 3.61 MIL/uL — AB (ref 3.87–5.11)
RDW: 12.5 % (ref 11.5–15.5)
WBC: 4.7 10*3/uL (ref 4.0–10.5)

## 2014-10-31 LAB — URINE CULTURE: Culture: >=100000

## 2014-10-31 LAB — MAGNESIUM: MAGNESIUM: 1.1 mg/dL — AB (ref 1.7–2.4)

## 2014-10-31 MED ORDER — MAGNESIUM SULFATE 2 GM/50ML IV SOLN
2.0000 g | Freq: Once | INTRAVENOUS | Status: AC
  Administered 2014-10-31: 2 g via INTRAVENOUS
  Filled 2014-10-31: qty 50

## 2014-10-31 MED ORDER — METFORMIN HCL ER 500 MG PO TB24: 1000.0000 mg | ORAL_TABLET | Freq: Two times a day (BID) | ORAL | Status: DC

## 2014-10-31 MED ORDER — POTASSIUM CHLORIDE CRYS ER 20 MEQ PO TBCR
40.0000 meq | EXTENDED_RELEASE_TABLET | Freq: Once | ORAL | Status: AC
  Administered 2014-10-31: 40 meq via ORAL
  Filled 2014-10-31: qty 2

## 2014-10-31 MED ORDER — GLIPIZIDE ER 10 MG PO TB24: 10.0000 mg | ORAL_TABLET | Freq: Two times a day (BID) | ORAL | Status: DC

## 2014-10-31 MED ORDER — SITAGLIPTIN PHOSPHATE 100 MG PO TABS: 100.0000 mg | ORAL_TABLET | Freq: Every day | ORAL | Status: DC

## 2014-10-31 MED ORDER — LISINOPRIL 20 MG PO TABS: ORAL_TABLET | ORAL | Status: DC

## 2014-10-31 MED ORDER — CANAGLIFLOZIN 100 MG PO TABS: 100.0000 mg | ORAL_TABLET | Freq: Every day | ORAL | Status: DC

## 2014-10-31 NOTE — Progress Notes (Signed)
Patient discharge to home with spouse. IV dc'd. Discharge education and instructions reviewed with patient. Vitals stable for patient and home meds returned to pt. 10/31/2014 3:37 PM Bowie,Sharlene

## 2014-10-31 NOTE — Care Management Note (Addendum)
Case Management Note  Patient Details  Name: Jean Robinson MRN: [id number] Date of Birth: 09-07-1955  Subjective/Objective:                Date: 9-28 Wednesday Spoke with patient at the bedside along with husband Aline Wesche, [phone]. Introduced self as Tourist information centre manager and explained role in discharge planning and how to be reached. Verified patient lives in [place], [address], [place] [address], with. Verified patient anticipates to go home with family at time of discharge and will have part-time supervision by family at this time to best of their knowledge. Patient has DME cane. Expressed potential need for no other DME. Patient denied  needing help with their medication. Patient drives to MD appointments. Verified patient has PCP Dr Eliezer Lofts. Patient states they currently receive [place] services through no one.   Plan: CM will continue to follow for discharge planning and [facility] resources.   Carles Collet RN BSN CM [phone]     Action/Plan: DC to home in care of husband. No HH needs at this time.    Expected Discharge Date:                  Expected Discharge Plan:  Home/Self Care  In-House Referral:     Discharge planning Services  CM Consult  Post Acute Care Choice:    Choice offered to:     DME Arranged:    DME Agency:     HH Arranged:    HH Agency:     Status of Service:  In process, will continue to follow  Medicare Important Message Given:    Date Medicare IM Given:    Medicare IM give by:    Date Additional Medicare IM Given:    Additional Medicare Important Message give by:     If discussed at [place] of Stay Meetings, dates discussed:    Additional Comments:  Carles Collet, RN 10/31/2014, 10:22 AM

## 2014-10-31 NOTE — Discharge Instructions (Signed)
To Whom it may concern:  Jean Robinson was admitted to [facility] on 10/29/2014, and remained in the hospital through 10/31/2014.  She has been advised that she should not return to work until 11/03/2014.  Cherene Altes, MD [facility] Office  [phone]     Viral Gastroenteritis  Viral gastroenteritis is also known as stomach flu. This condition affects the stomach and intestinal tract. It can cause sudden diarrhea and vomiting. The illness typically lasts 3 to 8 days. Most people develop an immune response that eventually gets rid of the virus. While this natural response develops, the virus can make you quite ill. CAUSES  Many different viruses can cause gastroenteritis, such as rotavirus or noroviruses. You can catch one of these viruses by consuming contaminated food or water. You may also catch a virus by sharing utensils or other personal items with an infected person or by touching a contaminated surface. SYMPTOMS  The most common symptoms are diarrhea and vomiting. These problems can cause a severe loss of body fluids (dehydration) and a body salt (electrolyte) imbalance. Other symptoms may include:  Fever.  Headache.  Fatigue.  Abdominal pain. DIAGNOSIS  Your caregiver can usually diagnose viral gastroenteritis based on your symptoms and a physical exam. A stool sample may also be taken to test for the presence of viruses or other infections. TREATMENT  This illness typically goes away on its own. Treatments are aimed at rehydration. The most serious cases of viral gastroenteritis involve vomiting so severely that you are not able to keep fluids down. In these cases, fluids must be given through an intravenous line (IV). HOME CARE INSTRUCTIONS   Drink enough fluids to keep your urine clear or pale yellow. Drink small amounts of fluids frequently and increase the amounts as tolerated.  Ask your caregiver for specific rehydration instructions.  Avoid:  Foods high  in sugar.  Alcohol.  Carbonated drinks.  Tobacco.  Juice.  Caffeine drinks.  Extremely hot or cold fluids.  Fatty, greasy foods.  Too much intake of anything at one time.  Dairy products until 24 to 48 hours after diarrhea stops.  You may consume probiotics. Probiotics are active cultures of beneficial bacteria. They may lessen the amount and number of diarrheal stools in adults. Probiotics can be found in yogurt with active cultures and in supplements.  Wash your hands well to avoid spreading the virus.  Only take over-the-counter or prescription medicines for pain, discomfort, or fever as directed by your caregiver. Do not give aspirin to children. Antidiarrheal medicines are not recommended.  Ask your caregiver if you should continue to take your regular prescribed and over-the-counter medicines.  Keep all follow-up appointments as directed by your caregiver. SEEK IMMEDIATE MEDICAL CARE IF:   You are unable to keep fluids down.  You do not urinate at least once every 6 to 8 hours.  You develop shortness of breath.  You notice blood in your stool or vomit. This may look like coffee grounds.  You have abdominal pain that increases or is concentrated in one small area (localized).  You have persistent vomiting or diarrhea.  You have a fever.  The patient is a child younger than 3 months, and he or she has a fever.  The patient is a child older than 3 months, and he or she has a fever and persistent symptoms.  The patient is a child older than 3 months, and he or she has a fever and symptoms suddenly get worse.  The patient is a baby, and he or she has no tears when crying. MAKE SURE YOU:   Understand these instructions.  Will watch your condition.  Will get help right away if you are not doing well or get worse. Document Released: 01/19/2005 Document Revised: 04/13/2011 Document Reviewed: 11/05/2010 [facility] Patient Information 2015 [place], [place]. This  information is not intended to replace advice given to you by your health care provider. Make sure you discuss any questions you have with your health care provider.

## 2014-10-31 NOTE — Discharge Summary (Signed)
DISCHARGE SUMMARY  Jean Robinson  MR#: [id number]  DOB:1955-07-25  Date of Admission: 10/29/2014 Date of Discharge: 10/31/2014  Attending Physician:MCCLUNG,JEFFREY T  Patient's PCP:Amy Diona Browner, MD  Consults:  none  Disposition: D/C home    Follow-up Appts:     Follow-up Information    Follow up with Eliezer Lofts, MD. Schedule an appointment as soon as possible for a visit in 1 week.   Specialty:  Family Medicine   Why:  Appointment with Dr. Eliezer Lofts 7 days post discharge   Contact information:   [place] [phone]      Tests needing f/u: -recheck of Mg and K+ is suggested  Discharge Diagnoses: Distributive/hypovolemic shock secondary to probable gastroenteritis Viral gastroenteritis Diabetes mellitus Type 2 Uncontrolled  HLD  Essential hypertension, benign Bipolar 2 disorder/major depressive episode  Initial presentation: 59 yo F Hx Depression, Bipolar DO, DM Type 2 uncontrolled (A1c 8.5 in June 2016), HLD, HTN, renal calculi, anal condylomata status post excision 08/29/2007 who presented to the ED c/o explosive diarrhea "20 times" w vomiting.  Stool was nonbloody and water like.  Had abdominal cramping but no abdominal pain, no dysuria, had not been exposed antibiotics.  Hospital Course:  Distributive/hypovolemic shock secondary to probable gastroenteritis -responded to volume resuscitation - shock resolved - BP normalized/climbing into HTN range   Hypomagnesemia -dose Mg IV prior to d/c - should improve w/ resumption of regular diet   Mild hypokalemia  -dose w/ 61meq KCl prior to d/c - resume regular diet   Viral gastroenteritis -C. difficile toxin studies negative -diarrhea nearly resolved at this time  -tolerating clear liquids w/ no difficulty - hungry for regular meal   Diabetes mellitus Type 2 Uncontrolled  -A1c 8.08 July 2014 -Glipizide 10 mg twice a day to resume when tolerating full diet  -Invokana and  Metformin to resume in stepwise fashion as diet improves   HLD  -Continue Pravachol 40 Mg Daily -LDL 61 / HDL 34   Essential hypertension, benign -Actually admitted Hypotensive - BP has returned to normal/high w/ volume resuscitation   Bipolar 2 disorder/major depressive episode -cont home medical regimen     Medication List    TAKE these medications        aspirin 81 MG tablet  Take 81 mg by mouth daily.     canagliflozin 100 MG Tabs tablet  Commonly known as:  INVOKANA  Take 1 tablet (100 mg total) by mouth daily.     glipiZIDE 10 MG 24 hr tablet  Commonly known as:  GLUCOTROL XL  Take 1 tablet (10 mg total) by mouth 2 (two) times daily.     lisinopril 20 MG tablet  Commonly known as:  PRINIVIL,ZESTRIL  TAKE 1 TABLET BY MOUTH DAILY  Start taking on:  11/03/2014     metFORMIN 500 MG 24 hr tablet  Commonly known as:  GLUCOPHAGE-XR  Take 2 tablets (1,000 mg total) by mouth 2 (two) times daily.     pravastatin 40 MG tablet  Commonly known as:  PRAVACHOL  TAKE 1 TABLET BY MOUTH DAILY     sertraline 25 MG tablet  Commonly known as:  ZOLOFT  TAKE 1 TABLET BY MOUTH DAILY     sitaGLIPtin 100 MG tablet  Commonly known as:  JANUVIA  Take 1 tablet (100 mg total) by mouth daily.     valACYclovir 500 MG tablet  Commonly known as:  VALTREX  TAKE 1 TABLET BY MOUTH DAILY  Day of Discharge BP 133/73 mmHg  Pulse 84  Temp(Src) 98.2 F (36.8 C) (Oral)  Resp 18  Ht 5\' 6"  (1.676 m)  Wt 80.74 kg (178 lb)  BMI 28.74 kg/m2  SpO2 96%  Physical Exam: General: No acute respiratory distress Lungs: Clear to auscultation bilaterally without wheezes or crackles Cardiovascular: Regular rate and rhythm without murmur gallop or rub normal S1 and S2 Abdomen: Nontender, nondistended, soft, bowel sounds positive, no rebound, no ascites, no appreciable mass Extremities: No significant cyanosis, clubbing, or edema bilateral lower extremities  Basic Metabolic Panel:  Recent  Labs Lab 10/29/14 1458 10/30/14 0333 10/31/14 0612  NA 137 139 140  K 4.6 4.3 3.3*  CL 103 106 110  CO2 19* 25 22  GLUCOSE 328* 130* 109*  BUN 16 12 5*  CREATININE 0.82 0.73 0.67  CALCIUM 10.1 8.3* 8.4*  MG  --   --  1.1*    Liver Function Tests:  Recent Labs Lab 10/29/14 1458 10/30/14 0333 10/31/14 0612  AST 28 22 35  ALT 35 25 35  ALKPHOS 79 45 41  BILITOT 1.0 0.5 0.6  PROT 7.5 5.6* 5.5*  ALBUMIN 4.4 3.2* 3.0*    Recent Labs Lab 10/29/14 1458  LIPASE 33   CBC:  Recent Labs Lab 10/29/14 1445 10/29/14 1458 10/30/14 0333 10/31/14 0612  WBC  --  19.0* 10.6* 4.7  NEUTROABS 17.5*  --   --  2.5  HGB  --  16.6* 12.6 11.6*  HCT  --  47.2* 36.2 34.5*  MCV  --  95.4 96.0 95.6  PLT  --  132* 208 177    CBG:  Recent Labs Lab 10/30/14 1951 10/31/14 0001 10/31/14 0417 10/31/14 0810 10/31/14 1211  GLUCAP 146* 101* 113* 113* 172*    Recent Results (from the past 240 hour(s))  Urine culture     Status: None   Collection Time: 10/29/14  6:30 PM  Result Value Ref Range Status   Specimen Description URINE, CLEAN CATCH  Final   Special Requests NONE  Final   Culture >=100,000 COLONIES/mL LACTOBACILLUS SPECIES  Final   Report Status 10/31/2014 FINAL  Final  MRSA PCR Screening     Status: None   Collection Time: 10/29/14 11:03 PM  Result Value Ref Range Status   MRSA by PCR NEGATIVE NEGATIVE Final    Comment:        The GeneXpert MRSA Assay (FDA approved for NASAL specimens only), is one component of a comprehensive MRSA colonization surveillance program. It is not intended to diagnose MRSA infection nor to guide or monitor treatment for MRSA infections.   C difficile quick scan w PCR reflex     Status: None   Collection Time: 10/30/14  1:26 AM  Result Value Ref Range Status   C Diff antigen NEGATIVE NEGATIVE Final   C Diff toxin NEGATIVE NEGATIVE Final   C Diff interpretation Negative for toxigenic C. difficile  Final      Time spent in  discharge (includes decision making & examination of pt): >35 minutes  10/31/2014, 2:06 PM   Cherene Altes, MD [facility] Office  [phone] Pager [phone]  On-Call/Text Page:      [email or web address]      password [facility]

## 2014-11-13 ENCOUNTER — Ambulatory Visit: Payer: BLUE CROSS/BLUE SHIELD | Admitting: Family Medicine

## 2014-11-16 ENCOUNTER — Encounter: Payer: Self-pay | Admitting: Family Medicine

## 2014-11-16 ENCOUNTER — Ambulatory Visit (INDEPENDENT_AMBULATORY_CARE_PROVIDER_SITE_OTHER): Payer: BLUE CROSS/BLUE SHIELD | Admitting: Family Medicine

## 2014-11-16 VITALS — BP 126/82 | HR 83 | Temp 98.6°F | Ht 66.5 in | Wt 173.8 lb

## 2014-11-16 DIAGNOSIS — E1165 Type 2 diabetes mellitus with hyperglycemia: Secondary | ICD-10-CM | POA: Diagnosis not present

## 2014-11-16 DIAGNOSIS — Z23 Encounter for immunization: Secondary | ICD-10-CM | POA: Diagnosis not present

## 2014-11-16 DIAGNOSIS — A084 Viral intestinal infection, unspecified: Secondary | ICD-10-CM | POA: Diagnosis not present

## 2014-11-16 DIAGNOSIS — IMO0001 Reserved for inherently not codable concepts without codable children: Secondary | ICD-10-CM

## 2014-11-16 DIAGNOSIS — R571 Hypovolemic shock: Secondary | ICD-10-CM | POA: Diagnosis not present

## 2014-11-16 DIAGNOSIS — E876 Hypokalemia: Secondary | ICD-10-CM

## 2014-11-16 DIAGNOSIS — I10 Essential (primary) hypertension: Secondary | ICD-10-CM

## 2014-11-16 LAB — COMPREHENSIVE METABOLIC PANEL
ALBUMIN: 4.6 g/dL (ref 3.5–5.2)
ALK PHOS: 73 U/L (ref 39–117)
ALT: 29 U/L (ref 0–35)
AST: 23 U/L (ref 0–37)
BILIRUBIN TOTAL: 0.7 mg/dL (ref 0.2–1.2)
BUN: 13 mg/dL (ref 6–23)
CALCIUM: 10.7 mg/dL — AB (ref 8.4–10.5)
CO2: 27 meq/L (ref 19–32)
CREATININE: 0.79 mg/dL (ref 0.40–1.20)
Chloride: 102 mEq/L (ref 96–112)
GFR: 78.57 mL/min (ref >60.00)
Glucose, Bld: 136 mg/dL — ABNORMAL HIGH (ref 70–99)
Potassium: 4.6 mEq/L (ref 3.5–5.1)
Sodium: 138 mEq/L (ref 135–145)
Total Protein: 7.7 g/dL (ref 6.0–8.3)

## 2014-11-16 LAB — HEMOGLOBIN A1C: HEMOGLOBIN A1C: 7.4 % — AB (ref 4.6–6.5)

## 2014-11-16 LAB — MAGNESIUM: MAGNESIUM: 1.3 mg/dL — AB (ref 1.5–2.5)

## 2014-11-16 NOTE — Assessment & Plan Note (Signed)
Well controlled. Continue current medication.  

## 2014-11-16 NOTE — Progress Notes (Signed)
Pre visit review using our clinic review tool, if applicable. No additional management support is needed unless otherwise documented below in the visit note. 

## 2014-11-16 NOTE — Patient Instructions (Addendum)
Stop at lab on way out. Schedule appt for insulin start if interested.

## 2014-11-16 NOTE — Assessment & Plan Note (Addendum)
Resolved. Likely cause of tachycardia. Reasssured pt likel no other underlying heart issue.

## 2014-11-16 NOTE — Assessment & Plan Note (Signed)
Due for re-eval. 

## 2014-11-16 NOTE — Assessment & Plan Note (Signed)
Due for re-eval. Likely normalized with nml po intake and resolution of viral syndrome.

## 2014-11-16 NOTE — Assessment & Plan Note (Signed)
Poor control. Discussed options. Stop glucotrol and add basal insulin. Pt will look into.

## 2014-11-16 NOTE — Progress Notes (Signed)
Subjective:    Patient ID: Jean Robinson, female    DOB: [birth year], 59 y.o.   MRN: [id number]  HPI  59 year old female pt with history of  Poorly controlledDM  presents for hospital follow up.  She was admitted from 9/26 to 9/28 for acute dehydration and hypovolemic shock secondary to N/V/D from acute gastroenteritis. Pulse was [place] Hospital Course:  Distributive/hypovolemic shock secondary to probable gastroenteritis -responded to volume resuscitation - shock resolved - BP, ulse normalized/climbing into HTN range   Hypomagnesemia -dose Mg IV prior to d/c - should improve w/ resumption of regular diet   Mild hypokalemia  -dose w/ 39meq KCl prior to d/c - resume regular diet   Viral gastroenteritis -C. difficile toxin studies negative -diarrhea nearly resolved at this time  -tolerating clear liquids w/ no difficulty - hungry for regular meal  UA nonpathogenic bacteria, neg nasal swab for MRSA  Diabetes mellitus Type 2 Uncontrolled  -A1c 8.08 July 2014 -Glipizide 10 mg twice a day to resume when tolerating full diet  -Invokana and Metformin to resume in stepwise fashion as diet improves   HLD  -Continue Pravachol 40 Mg Daily -LDL 61 / HDL 34   Recommended to have Mg, K electrolytes checked at follow up.  Since she has been home she reports she is back in nml range.  he pulse remain 70-73. No further emesis, diarrhea.  Drinking fluids. No chest pain, no SOB.  HR today 86 BP Readings from Last 3 Encounters:  11/16/14 126/82  10/31/14 133/73  08/02/14 124/70   Blood sugar at home lately has been FBS 144, 2 post prandially 260   One 56 at bedtime. She is ready to start on insulin instead of oral meds.  She has not been using invokana given cost.  She is using [place], glucotrol and metformin.      Review of Systems  Constitutional: Negative for fever and fatigue.  HENT: Negative for ear pain.   Eyes: Negative for pain.  Respiratory: Negative for chest  tightness and shortness of breath.   Cardiovascular: Negative for chest pain, palpitations and leg swelling.  Gastrointestinal: Negative for abdominal pain.  Genitourinary: Negative for dysuria.       Objective:   Physical Exam  Constitutional: Vital signs are normal. She appears well-developed and well-nourished. She is cooperative.  Non-toxic appearance. She does not appear ill. No distress.  HENT:  Head: Normocephalic.  Right Ear: Hearing, tympanic membrane, external ear and ear canal normal. Tympanic membrane is not erythematous, not retracted and not bulging.  Left Ear: Hearing, tympanic membrane, external ear and ear canal normal. Tympanic membrane is not erythematous, not retracted and not bulging.  Nose: No mucosal edema or rhinorrhea. Right sinus exhibits no maxillary sinus tenderness and no frontal sinus tenderness. Left sinus exhibits no maxillary sinus tenderness and no frontal sinus tenderness.  Mouth/Throat: Uvula is midline, oropharynx is clear and moist and mucous membranes are normal.  Eyes: Conjunctivae, EOM and lids are normal. Pupils are equal, round, and reactive to light. Lids are everted and swept, no foreign bodies found.  Neck: Trachea normal and normal range of motion. Neck supple. Carotid bruit is not present. No thyroid mass and no thyromegaly present.  Cardiovascular: Normal rate, regular rhythm, S1 normal, S2 normal, normal heart sounds, intact distal pulses and normal pulses.  Exam reveals no gallop and no friction rub.   No murmur heard. Pulmonary/Chest: Effort normal and breath sounds normal. No tachypnea. No respiratory  distress. She has no decreased breath sounds. She has no wheezes. She has no rhonchi. She has no rales.  Abdominal: Soft. Normal appearance and bowel sounds are normal. There is no tenderness.  Neurological: She is alert.  Skin: Skin is warm, dry and intact. No rash noted.  Psychiatric: Her speech is normal and behavior is normal. Judgment and  thought content normal. Her mood appears not anxious. Cognition and memory are normal. She does not exhibit a depressed mood.          Assessment & Plan:

## 2014-11-16 NOTE — Assessment & Plan Note (Signed)
Resolved

## 2014-12-03 ENCOUNTER — Telehealth: Payer: Self-pay

## 2014-12-03 NOTE — Telephone Encounter (Signed)
Bea with [facility] request verification of instructions for metformin 500 mg 24 hr tab; pt states taking 2 tabs bid; med list has 2 tabs bid. Bea voiced understanding.

## 2015-01-29 ENCOUNTER — Telehealth: Payer: Self-pay | Admitting: Family Medicine

## 2015-01-29 DIAGNOSIS — E1165 Type 2 diabetes mellitus with hyperglycemia: Secondary | ICD-10-CM

## 2015-01-29 DIAGNOSIS — IMO0001 Reserved for inherently not codable concepts without codable children: Secondary | ICD-10-CM

## 2015-01-29 DIAGNOSIS — E785 Hyperlipidemia, unspecified: Secondary | ICD-10-CM

## 2015-01-29 NOTE — Telephone Encounter (Signed)
-----   Message from Ellamae Sia sent at 01/17/2015 12:22 PM EST ----- Regarding: Lab orders for Wednesday, 12.28.16 Lab orders for a f/u appt

## 2015-01-30 ENCOUNTER — Other Ambulatory Visit: Payer: BLUE CROSS/BLUE SHIELD

## 2015-02-08 ENCOUNTER — Ambulatory Visit: Payer: BLUE CROSS/BLUE SHIELD | Admitting: Family Medicine

## 2015-02-26 ENCOUNTER — Other Ambulatory Visit: Payer: Self-pay

## 2015-02-26 DIAGNOSIS — Z1231 Encounter for screening mammogram for malignant neoplasm of breast: Secondary | ICD-10-CM

## 2015-03-15 ENCOUNTER — Ambulatory Visit: Payer: BLUE CROSS/BLUE SHIELD

## 2015-03-21 ENCOUNTER — Other Ambulatory Visit: Payer: Self-pay

## 2015-03-21 NOTE — Telephone Encounter (Signed)
Medication refill request: Valtrex  Last AEX:  11/09/13 PG Next AEX:04/12/15 PG  Refill authorized: 11/09/2013 #30 tabs 11 Refills  Today: #30 tabs 0 Refills ? Please advise

## 2015-03-22 MED ORDER — VALACYCLOVIR HCL 500 MG PO TABS: ORAL_TABLET | ORAL | Status: DC

## 2015-04-03 ENCOUNTER — Other Ambulatory Visit: Payer: Self-pay | Admitting: Family Medicine

## 2015-04-03 ENCOUNTER — Telehealth: Payer: Self-pay | Admitting: Family Medicine

## 2015-04-03 ENCOUNTER — Ambulatory Visit
Admission: RE | Admit: 2015-04-03 | Discharge: 2015-04-03 | Disposition: A | Discharge status: Home / Self Care | Payer: BLUE CROSS/BLUE SHIELD | Source: Ambulatory Visit

## 2015-04-03 DIAGNOSIS — Z1231 Encounter for screening mammogram for malignant neoplasm of breast: Secondary | ICD-10-CM

## 2015-04-03 NOTE — Telephone Encounter (Signed)
Pt has appt for back pain on 04/04/15 with Dr Diona Browner. Per TH note if pt condition worsens prior to appt pt will go to [facility].

## 2015-04-03 NOTE — Telephone Encounter (Signed)
[place]  Patient Name: Jean Robinson  DOB: 1955/05/29    Initial Comment Caller states that she is on high blood pressure medication, has allergy issues, possible sinus infection.   Nurse Assessment  Nurse: Harlow Mares, RN, Suanne Marker Date/Time (Eastern Time): 04/03/2015 4:03:08 PM  Confirm and document reason for call. If symptomatic, describe symptoms. You must click the next button to save text entered. ---Caller states that she is on high blood pressure medication and type 2 Diabetic, has allergy issues, possible sinus infection. She is not sure what to take OTC. Reports that she has an appt with MD at [facility] tomorrow for another issue. Reports temp is currently 102.5 (orally), bodyaches, reports pressure across her forehead and ears are tender, under eyes are tender and glands are tender (side of her neck), no problems swallowing, dry cough as well, having post nasal drainage. Stuffy nose. No drainage. Tickle in the back of the throat. No vomiting/diarrhea. Reports slight chills. Headache, but is taking 500mg  Tylenol without relief. Blood sugars are slightly higher than normal.  Has the patient traveled out of the country within the last 30 days? ---No  Does the patient have any new or worsening symptoms? ---Yes  Will a triage be completed? ---Yes  Related visit to physician within the last 2 weeks? ---No  Does the PT have any chronic conditions? (i.e. diabetes, asthma, etc.) ---Yes  List chronic conditions. ---type 2 diabetic, hypertension  Is this a behavioral health or substance abuse call? ---No     Guidelines    Guideline Title Affirmed Question Affirmed Notes  Sinus Pain or Congestion [1] Fever > 100.5 F (38.1 C) AND [2] diabetes mellitus or weak immune system (e.g., HIV positive, cancer chemo, splenectomy, organ transplant, chronic steroids)    Final Disposition User   See Physician within 4 Hours (or PCP triage) Harlow Mares, RN, Rhonda     Comments  Caller refuses to go to [facility], unable to make appt with her MD at the [facility] location this afternoon. Reports that she has an appt with her MD tomorrow anyway. She states that she will call back or proceed to [facility] for worsening symptoms and will continue to check her temp and Blood Sugars.   Referrals  REFERRED TO PCP OFFICE   Disagree/Comply: Disagree  Disagree/Comply Reason: Wait and see

## 2015-04-04 ENCOUNTER — Telehealth: Payer: Self-pay | Admitting: *Deleted

## 2015-04-04 ENCOUNTER — Ambulatory Visit (INDEPENDENT_AMBULATORY_CARE_PROVIDER_SITE_OTHER): Payer: BLUE CROSS/BLUE SHIELD | Admitting: Family Medicine

## 2015-04-04 ENCOUNTER — Encounter: Payer: Self-pay | Admitting: Family Medicine

## 2015-04-04 VITALS — BP 126/70 | HR 104 | Temp 98.0°F | Ht 66.5 in | Wt 177.8 lb

## 2015-04-04 DIAGNOSIS — J111 Influenza due to unidentified influenza virus with other respiratory manifestations: Secondary | ICD-10-CM

## 2015-04-04 DIAGNOSIS — M545 Low back pain, unspecified: Secondary | ICD-10-CM

## 2015-04-04 DIAGNOSIS — R059 Cough, unspecified: Secondary | ICD-10-CM

## 2015-04-04 DIAGNOSIS — R05 Cough: Secondary | ICD-10-CM

## 2015-04-04 LAB — POCT INFLUENZA A/B
Influenza A, POC: POSITIVE — AB
Influenza B, POC: NEGATIVE

## 2015-04-04 MED ORDER — CYCLOBENZAPRINE HCL 10 MG PO TABS: 10.0000 mg | ORAL_TABLET | Freq: Every day | ORAL | Status: DC

## 2015-04-04 MED ORDER — DICLOFENAC SODIUM 75 MG PO TBEC: 75.0000 mg | DELAYED_RELEASE_TABLET | Freq: Two times a day (BID) | ORAL | Status: DC

## 2015-04-04 MED ORDER — OSELTAMIVIR PHOSPHATE 75 MG PO CAPS: 75.0000 mg | ORAL_CAPSULE | Freq: Two times a day (BID) | ORAL | Status: DC

## 2015-04-04 NOTE — Telephone Encounter (Signed)
Patient stopped back by office to say that [facility] did not have the prescription for the diclofenac or muscle relaxant.  I sent is the Rx for Diclofenac since the AVS stated the medication and directions.  Will forward to Dr. Diona Browner to sent in muscle relaxant.  Also patient ask what core strengthening was that was listed on her AVS.  Low back exercise and Core Strengthening Handouts mailed to patient.

## 2015-04-04 NOTE — Progress Notes (Signed)
Pre visit review using our clinic review tool, if applicable. No additional management support is needed unless otherwise documented below in the visit note. 

## 2015-04-04 NOTE — Progress Notes (Signed)
   Subjective:    Patient ID: Jean Robinson, female    DOB: December 03, 1955, 60 y.o.   MRN: [id number]  HPI  60 year old female pt presents today with new onset back pain, low back.  Muscle cramping across bilateral low back. No radiation of pain. No numbness, no weakness.  Later in day gets work after standing at work. Ibuprofen helps some, heat, ice.   Has ordered new bed, wears good shoes.  Yesterday she had fever 102, body aches, sinus pressure x 24 hours.  Cough, productive, cough at night, drinking a lot of water.  Husband with the flu.  Social History /Family History/Past Medical History reviewed and updated if needed. Hx of sciatica in past.  Review of Systems  Constitutional: Positive for fever.  HENT: Negative for ear pain.   Eyes: Negative for pain.  Respiratory: Positive for cough. Negative for shortness of breath.   Cardiovascular: Negative for chest pain and leg swelling.  Gastrointestinal: Negative for abdominal pain.       Objective:   Physical Exam  Constitutional: Vital signs are normal. She appears well-developed and well-nourished. She is cooperative.  Non-toxic appearance. She appears ill. No distress.  HENT:  Head: Normocephalic.  Right Ear: Hearing, tympanic membrane, external ear and ear canal normal. Tympanic membrane is not erythematous, not retracted and not bulging.  Left Ear: Hearing, tympanic membrane, external ear and ear canal normal. Tympanic membrane is not erythematous, not retracted and not bulging.  Nose: No mucosal edema or rhinorrhea. Right sinus exhibits no maxillary sinus tenderness and no frontal sinus tenderness. Left sinus exhibits no maxillary sinus tenderness and no frontal sinus tenderness.  Mouth/Throat: Uvula is midline, oropharynx is clear and moist and mucous membranes are normal.  Eyes: Conjunctivae, EOM and lids are normal. Pupils are equal, round, and reactive to light. Lids are everted and swept, no foreign bodies found.  Neck:  Trachea normal and normal range of motion. Neck supple. Carotid bruit is not present. No thyroid mass and no thyromegaly present.  Cardiovascular: Normal rate, regular rhythm, S1 normal, S2 normal, normal heart sounds, intact distal pulses and normal pulses.  Exam reveals no gallop and no friction rub.   No murmur heard. Pulmonary/Chest: Effort normal and breath sounds normal. No tachypnea. No respiratory distress. She has no decreased breath sounds. She has no wheezes. She has no rhonchi. She has no rales.  Abdominal: Soft. Normal appearance and bowel sounds are normal. There is no tenderness.  Musculoskeletal:       Lumbar back: She exhibits tenderness. She exhibits normal range of motion and no bony tenderness.  Neurological: She is alert.  Skin: Skin is warm, dry and intact. No rash noted.  Psychiatric: Her speech is normal and behavior is normal. Judgment and thought content normal. Her mood appears not anxious. Cognition and memory are normal. She does not exhibit a depressed mood.          Assessment & Plan:

## 2015-04-04 NOTE — Patient Instructions (Addendum)
Start low back stretches. Use muscle relaxant at night.  Start diclofenac 75 mg twice daily x 2 weeks. Start core strengthening. Follow up in 2 weeks if not improving.  Start on tamiflu, rest, fluids. Stay out of work until 24 hours after fever resolved.

## 2015-04-12 ENCOUNTER — Encounter: Payer: Self-pay | Admitting: Nurse Practitioner

## 2015-04-12 ENCOUNTER — Ambulatory Visit: Payer: Self-pay | Admitting: Nurse Practitioner

## 2015-04-18 ENCOUNTER — Other Ambulatory Visit: Payer: Self-pay | Admitting: Family Medicine

## 2015-04-18 NOTE — Telephone Encounter (Signed)
Last office visit 04/04/2015.  Last refilled 04/04/2015.  Ok to refill?

## 2015-05-04 LAB — HM DIABETES EYE EXAM

## 2015-05-07 DIAGNOSIS — M545 Low back pain, unspecified: Secondary | ICD-10-CM | POA: Insufficient documentation

## 2015-05-07 DIAGNOSIS — J111 Influenza due to unidentified influenza virus with other respiratory manifestations: Secondary | ICD-10-CM | POA: Insufficient documentation

## 2015-05-07 NOTE — Assessment & Plan Note (Signed)
Start low back stretches. Use muscle relaxant at night.  Start diclofenac 75 mg twice daily x 2 weeks. Start core strengthening. Follow up in 2 weeks if not improving.

## 2015-05-07 NOTE — Assessment & Plan Note (Signed)
Discussed symptomatic care.  Hydration, rest. Call if SOB, cough worsening or prolongued fever. Reviewed flu timeline. She decided to use of tamiflu. Pt agreed. Discussed family prophylaxis, to consider tamiflu prophylaxis. She was advised to not return to work until 24 hour after fever resolved on no antipyretics.

## 2015-06-04 ENCOUNTER — Other Ambulatory Visit: Payer: Self-pay | Admitting: Family Medicine

## 2015-06-04 NOTE — Telephone Encounter (Signed)
Last office visit 04/04/2015.  Last refilled 04/19/2015 for #15 with 1 refill.  Ok to refill?

## 2015-07-30 ENCOUNTER — Other Ambulatory Visit (INDEPENDENT_AMBULATORY_CARE_PROVIDER_SITE_OTHER): Payer: BLUE CROSS/BLUE SHIELD

## 2015-07-30 ENCOUNTER — Telehealth: Payer: Self-pay | Admitting: Family Medicine

## 2015-07-30 DIAGNOSIS — IMO0001 Reserved for inherently not codable concepts without codable children: Secondary | ICD-10-CM

## 2015-07-30 DIAGNOSIS — E785 Hyperlipidemia, unspecified: Secondary | ICD-10-CM

## 2015-07-30 DIAGNOSIS — E1165 Type 2 diabetes mellitus with hyperglycemia: Principal | ICD-10-CM

## 2015-07-30 LAB — MAGNESIUM: Magnesium: 1.4 mg/dL — ABNORMAL LOW (ref 1.5–2.5)

## 2015-07-30 NOTE — Telephone Encounter (Signed)
-----   Message from Ellamae Sia sent at 07/23/2015  2:29 PM EDT ----- Regarding: Lab orders for Tuesday, 6.27.17 Lab orders for a f/u appt

## 2015-07-30 NOTE — Telephone Encounter (Signed)
Already ordered

## 2015-07-31 ENCOUNTER — Other Ambulatory Visit (INDEPENDENT_AMBULATORY_CARE_PROVIDER_SITE_OTHER): Payer: BLUE CROSS/BLUE SHIELD

## 2015-07-31 DIAGNOSIS — E876 Hypokalemia: Secondary | ICD-10-CM

## 2015-07-31 LAB — LIPID PANEL
CHOLESTEROL: 169 mg/dL (ref 0–200)
HDL: 53 mg/dL (ref >39.00)
LDL Cholesterol: 83 mg/dL (ref 0–99)
NONHDL: 115.67
Total CHOL/HDL Ratio: 3
Triglycerides: 161 mg/dL — ABNORMAL HIGH (ref 0.0–149.0)
VLDL: 32.2 mg/dL (ref 0.0–40.0)

## 2015-07-31 LAB — COMPREHENSIVE METABOLIC PANEL
ALT: 34 U/L (ref 0–35)
AST: 26 U/L (ref 0–37)
Albumin: 4.4 g/dL (ref 3.5–5.2)
Alkaline Phosphatase: 72 U/L (ref 39–117)
BILIRUBIN TOTAL: 0.5 mg/dL (ref 0.2–1.2)
BUN: 11 mg/dL (ref 6–23)
CO2: 27 meq/L (ref 19–32)
CREATININE: 0.76 mg/dL (ref 0.40–1.20)
Calcium: 10.3 mg/dL (ref 8.4–10.5)
Chloride: 104 mEq/L (ref 96–112)
GFR: 81.97 mL/min (ref >60.00)
GLUCOSE: 169 mg/dL — AB (ref 70–99)
Potassium: 4.7 mEq/L (ref 3.5–5.1)
SODIUM: 140 meq/L (ref 135–145)
Total Protein: 7.5 g/dL (ref 6.0–8.3)

## 2015-08-02 ENCOUNTER — Ambulatory Visit: Payer: BLUE CROSS/BLUE SHIELD | Admitting: Family Medicine

## 2015-08-02 ENCOUNTER — Other Ambulatory Visit: Payer: Self-pay | Admitting: Family Medicine

## 2015-08-09 ENCOUNTER — Ambulatory Visit (INDEPENDENT_AMBULATORY_CARE_PROVIDER_SITE_OTHER): Payer: BLUE CROSS/BLUE SHIELD | Admitting: Family Medicine

## 2015-08-09 ENCOUNTER — Encounter: Payer: Self-pay | Admitting: Family Medicine

## 2015-08-09 VITALS — BP 120/76 | HR 85 | Temp 97.7°F | Ht 66.5 in | Wt 177.5 lb

## 2015-08-09 DIAGNOSIS — IMO0001 Reserved for inherently not codable concepts without codable children: Secondary | ICD-10-CM

## 2015-08-09 DIAGNOSIS — Z1159 Encounter for screening for other viral diseases: Secondary | ICD-10-CM

## 2015-08-09 DIAGNOSIS — R5383 Other fatigue: Secondary | ICD-10-CM | POA: Diagnosis not present

## 2015-08-09 DIAGNOSIS — E785 Hyperlipidemia, unspecified: Secondary | ICD-10-CM | POA: Diagnosis not present

## 2015-08-09 DIAGNOSIS — I10 Essential (primary) hypertension: Secondary | ICD-10-CM | POA: Diagnosis not present

## 2015-08-09 DIAGNOSIS — E1165 Type 2 diabetes mellitus with hyperglycemia: Secondary | ICD-10-CM | POA: Diagnosis not present

## 2015-08-09 DIAGNOSIS — E876 Hypokalemia: Secondary | ICD-10-CM

## 2015-08-09 DIAGNOSIS — Z20818 Contact with and (suspected) exposure to other bacterial communicable diseases: Secondary | ICD-10-CM

## 2015-08-09 DIAGNOSIS — Z2089 Contact with and (suspected) exposure to other communicable diseases: Secondary | ICD-10-CM

## 2015-08-09 LAB — HEMOGLOBIN A1C: Hgb A1c MFr Bld: 9.8 % — ABNORMAL HIGH (ref 4.6–6.5)

## 2015-08-09 LAB — HM DIABETES FOOT EXAM

## 2015-08-09 LAB — VITAMIN D 25 HYDROXY (VIT D DEFICIENCY, FRACTURES): VITD: 39.56 ng/mL (ref 30.00–100.00)

## 2015-08-09 MED ORDER — AMOXICILLIN 500 MG PO CAPS: 1000.0000 mg | ORAL_CAPSULE | Freq: Two times a day (BID) | ORAL | Status: DC

## 2015-08-09 MED ORDER — SITAGLIPTIN PHOSPHATE 100 MG PO TABS: 100.0000 mg | ORAL_TABLET | Freq: Every day | ORAL | Status: DC

## 2015-08-09 NOTE — Progress Notes (Signed)
60 year old female presents for follow up DM.  She has been traveling a lot. 2-3 weeks ago Was on plane, stuck in delay.. 4 hours. Told to stay in seats given turbulence. Noted bilateral swelling in ankles, tightness in feet. She elevated her feet.  Exposed to strep throat on 7/4, mild sore throat yesterday , none today.    Diabetes, Due for re-eval. On januvia, metformin and glipizide Lab Results  Component Value Date   HGBA1C 7.4* 11/16/2014  Using medications without difficulties:Yes.  Hypoglycemic episodes: None  Hyperglycemic episodes: rare Feet problems: None eye exam within last year:  yes FBS: 90-160s.. She has been traveling a lot. She had issues with [place] in past.    Hypertension: Well controlled on lisinopril  BP Readings from Last 3 Encounters:  08/09/15 120/76  04/04/15 126/70  11/16/14 126/82  Using medication without problems or lightheadedness:  Chest pain with exertion: None Edema:None  Short of breath:None  Average home BPs: 120/70  Other issues:   Elevated Cholesterol:  At GOAL on pravastatin 40 mg daily. Lab Results  Component Value Date   CHOL 169 07/31/2015   HDL 53.00 07/31/2015   LDLCALC 83 07/31/2015   TRIG 161.0* 07/31/2015   CHOLHDL 3 07/31/2015  Compliant now with medication.  Using medications without problems: None  Muscle aches: None  Other complaints: walking a lot on vacation traveling.   Social History /Family History/Past Medical History reviewed and updated if needed.  Review of Systems  Occ intermittant tightness in upper back. Sees massage therapist. NO numbness weakness, no current pain. All other systems reviewed and are negative.  Objective:   Physical Exam  Constitutional: Vital signs are normal. She appears well-developed and well-nourished. She is cooperative. Non-toxic appearance. She does not appear ill. No distress.  HENT:  Head: Normocephalic.  Right Ear: Hearing, tympanic membrane,  external ear and ear canal normal. Tympanic membrane is not erythematous, not retracted and not bulging.  Left Ear: Hearing, tympanic membrane, external ear and ear canal normal. Tympanic membrane is not erythematous, not retracted and not bulging.  Nose: No mucosal edema or rhinorrhea. Right sinus exhibits no maxillary sinus tenderness and no frontal sinus tenderness. Left sinus exhibits no maxillary sinus tenderness and no frontal sinus tenderness.  Mouth/Throat: Uvula is midline, oropharynx is clear and moist and mucous membranes are normal.  Eyes: Conjunctivae, EOM and lids are normal. Pupils are equal, round, and reactive to light. No foreign bodies found.  Neck: Trachea normal and normal range of motion. Neck supple. Carotid bruit is not present. No mass and no thyromegaly present.  Cardiovascular: Normal rate, regular rhythm, S1 normal, S2 normal, normal heart sounds, intact distal pulses and normal pulses. Exam reveals no gallop and no friction rub.  No murmur heard.  Pulmonary/Chest: Effort normal and breath sounds normal. Not tachypneic. No respiratory distress. She has no decreased breath sounds. She has no wheezes. She has no rhonchi. She has no rales.  Abdominal: Soft. Normal appearance and bowel sounds are normal. There is no tenderness.  Neurological: She is alert.  Skin: Skin is warm, dry and intact. No rash noted.  Psychiatric: Her speech is normal and behavior is normal. Judgment and thought content normal. Her mood appears not anxious. Cognition and memory are normal. She does not exhibit a depressed mood.  MSK: full ROM in neck and B shoulders, neg spurling, no ttp  Diabetic foot exam:  No skin breakdown  No calluses  Normal DP pulses  Normal  sensation to light touch and monofilament  Nails normal

## 2015-08-09 NOTE — Patient Instructions (Addendum)
Stop at lab on way out for A1C. Restart januvia regualrly. Work on low [employer], increase exercise and work on weight loss.  If sore throat worsening given strep throat exposure.. Fill  antibitoics.

## 2015-08-09 NOTE — Assessment & Plan Note (Addendum)
Low. Start magnesium every feww days.

## 2015-08-09 NOTE — Assessment & Plan Note (Signed)
Well controlled. Continue current medication.  

## 2015-08-09 NOTE — Assessment & Plan Note (Signed)
In nml range. 

## 2015-08-09 NOTE — Assessment & Plan Note (Signed)
Given weekend provided amox course to use if ST worsens.

## 2015-08-09 NOTE — Progress Notes (Signed)
Pre visit review using our clinic review tool, if applicable. No additional management support is needed unless otherwise documented below in the visit note. 

## 2015-08-09 NOTE — Assessment & Plan Note (Signed)
Due for re-eval. Encouraged exercise, weight loss, healthy eating habits.

## 2015-08-10 LAB — HEPATITIS C ANTIBODY: HCV Ab: NEGATIVE

## 2015-08-21 ENCOUNTER — Other Ambulatory Visit: Payer: Self-pay | Admitting: Family Medicine

## 2015-09-04 ENCOUNTER — Encounter: Payer: Self-pay | Admitting: Family Medicine

## 2015-09-04 ENCOUNTER — Ambulatory Visit (INDEPENDENT_AMBULATORY_CARE_PROVIDER_SITE_OTHER): Payer: BLUE CROSS/BLUE SHIELD | Admitting: Family Medicine

## 2015-09-04 ENCOUNTER — Ambulatory Visit (INDEPENDENT_AMBULATORY_CARE_PROVIDER_SITE_OTHER): Payer: BLUE CROSS/BLUE SHIELD

## 2015-09-04 VITALS — BP 125/82 | HR 77 | Temp 97.7°F | Wt 171.2 lb

## 2015-09-04 DIAGNOSIS — R350 Frequency of micturition: Secondary | ICD-10-CM | POA: Insufficient documentation

## 2015-09-04 LAB — POCT URINALYSIS DIPSTICK
BILIRUBIN UA: NEGATIVE
Blood, UA: NEGATIVE
Glucose, UA: NEGATIVE
KETONES UA: NEGATIVE
LEUKOCYTES UA: NEGATIVE
NITRITE UA: NEGATIVE
Protein, UA: NEGATIVE
Spec Grav, UA: 1.025
Urobilinogen, UA: 0.2
pH, UA: 5

## 2015-09-04 NOTE — Assessment & Plan Note (Addendum)
New acute problem. Unclear etiology/prognosis at this time. Patient with complaints of urinary frequency and some abdominal pain/back pain with a prior history of kidney stones. Urinalysis negative today. Sending for culture. Patient concerned about stone. I offered KUB and patient agreed. Holding off on antibiotics and further medications at this time pending the KUB.

## 2015-09-04 NOTE — Progress Notes (Signed)
Pre visit review using our clinic review tool, if applicable. No additional management support is needed unless otherwise documented below in the visit note. 

## 2015-09-04 NOTE — Patient Instructions (Signed)
We will call with your results.  Take care  Dr. Laderrick Wilk  

## 2015-09-04 NOTE — Progress Notes (Signed)
Subjective:  Patient ID: Jean Robinson, female    DOB: 1955-06-24  Age: 60 y.o. MRN: [id number]  CC: Urinary frequency/abdominal pressure, back pain  HPI:  60 year old female presents with the above complaints.  Patient reports that this morning she awoke and had significant lower abdominal pressure and urinary urgency. She states that she urinated several times with little output. This is clearly improved. However, she subsequently developed associated low back pain and right inguinal pain. She states that she has a history of kidney stones and was concerned about stone given her symptoms. No associated fevers or chills. She currently has no pain or discomfort. No known exacerbating or relieving factors. No other complaint at this time.  Social Hx   Social History   Social History  . Marital status: Married    Spouse name: N/A  . Number of children: 4  . Years of education: N/A   Occupational History  . [employer]   Social History Main Topics  . Smoking status: Never Smoker  . Smokeless tobacco: Never Used  . Alcohol use Yes     Comment: occassionally  . Drug use: No  . Sexual activity: Yes    Partners: Male     Comment: 1st spouse died 23-Dec-2022 secondary to respiratory failure of drug overdose. Remaried 2007   Other Topics Concern  . None   Social History Narrative   Regular exercise --yes, treadmill 15 min 3 days per week    Originally from [place] and moved here 30 years ago and went to college at [employer] with a degree in the child education   Occasional drinker and nonsmoker   Has 4 children   Lives with her second husband   Review of Systems  Constitutional: Negative.   Gastrointestinal: Positive for abdominal pain.  Genitourinary: Positive for urgency.  Musculoskeletal: Positive for back pain.   Objective:  BP 125/82 (BP Location: Left Arm, Patient Position: Sitting, Cuff Size: Normal)   Pulse 77   Temp 97.7 F (36.5 C) (Oral)    Wt 171 lb 4 oz (77.7 kg)   SpO2 97%   BMI 27.23 kg/m   BP/Weight 09/04/2015 0000000 [id number]  Systolic BP 0000000 [id number]  Diastolic BP 82 76 70  Wt. (Lbs) 171.25 177.5 177.75  BMI 27.23 28.22 28.26   Physical Exam  Constitutional: She is oriented to person, place, and time. She appears well-developed. No distress.  Cardiovascular: Normal rate and regular rhythm.   Pulmonary/Chest: Effort normal. She has no wheezes. She has no rales.  Abdominal: Soft. She exhibits no distension. There is no tenderness.  No CVA tenderness.  Neurological: She is alert and oriented to person, place, and time.  Psychiatric: She has a normal mood and affect.  Vitals reviewed.  Lab Results  Component Value Date   WBC 4.7 10/31/2014   HGB 11.6 (L) 10/31/2014   HCT 34.5 (L) 10/31/2014   PLT 177 10/31/2014   GLUCOSE 169 (H) 07/31/2015   CHOL 169 07/31/2015   TRIG 161.0 (H) 07/31/2015   HDL 53.00 07/31/2015   LDLCALC 83 07/31/2015   ALT 34 07/31/2015   AST 26 07/31/2015   NA 140 07/31/2015   K 4.7 07/31/2015   CL 104 07/31/2015   CREATININE 0.76 07/31/2015   BUN 11 07/31/2015   CO2 27 07/31/2015   HGBA1C 9.8 (H) 08/09/2015   MICROALBUR 1.3 06/06/2009    Assessment & Plan:   Problem List Items Addressed This Visit  Urinary frequency - Primary    New acute problem. Unclear etiology/prognosis at this time. Patient with complaints of urinary frequency and some abdominal pain/back pain with a prior history of kidney stones. Urinalysis negative today. Sending for culture. Patient concerned about stone. I offered KUB and patient agreed. Holding off on antibiotics and further medications at this time pending the KUB.      Relevant Orders   POCT Urinalysis Dipstick (Completed)   DG Abd 1 View    Other Visit Diagnoses   None.    Follow-up: PRN  [place]

## 2015-09-19 ENCOUNTER — Emergency Department (HOSPITAL_COMMUNITY): Payer: BLUE CROSS/BLUE SHIELD

## 2015-09-19 ENCOUNTER — Emergency Department (HOSPITAL_COMMUNITY)
Admission: EM | Admit: 2015-09-19 | Discharge: 2015-09-19 | Disposition: A | Discharge status: Home / Self Care | Payer: BLUE CROSS/BLUE SHIELD | Attending: Emergency Medicine | Admitting: Emergency Medicine

## 2015-09-19 ENCOUNTER — Encounter (HOSPITAL_COMMUNITY): Payer: Self-pay | Admitting: Emergency Medicine

## 2015-09-19 DIAGNOSIS — Z79899 Other long term (current) drug therapy: Secondary | ICD-10-CM | POA: Diagnosis not present

## 2015-09-19 DIAGNOSIS — E119 Type 2 diabetes mellitus without complications: Secondary | ICD-10-CM | POA: Insufficient documentation

## 2015-09-19 DIAGNOSIS — Z7982 Long term (current) use of aspirin: Secondary | ICD-10-CM | POA: Diagnosis not present

## 2015-09-19 DIAGNOSIS — R1030 Lower abdominal pain, unspecified: Secondary | ICD-10-CM

## 2015-09-19 DIAGNOSIS — I10 Essential (primary) hypertension: Secondary | ICD-10-CM | POA: Insufficient documentation

## 2015-09-19 DIAGNOSIS — Z7984 Long term (current) use of oral hypoglycemic drugs: Secondary | ICD-10-CM | POA: Diagnosis not present

## 2015-09-19 DIAGNOSIS — N3001 Acute cystitis with hematuria: Secondary | ICD-10-CM | POA: Diagnosis not present

## 2015-09-19 DIAGNOSIS — R1032 Left lower quadrant pain: Secondary | ICD-10-CM | POA: Diagnosis present

## 2015-09-19 LAB — CBC
HEMATOCRIT: 36.7 % (ref 36.0–46.0)
HEMOGLOBIN: 12.9 g/dL (ref 12.0–15.0)
MCH: 32.6 pg (ref 26.0–34.0)
MCHC: 35.1 g/dL (ref 30.0–36.0)
MCV: 92.7 fL (ref 78.0–100.0)
Platelets: 242 10*3/uL (ref 150–400)
RBC: 3.96 MIL/uL (ref 3.87–5.11)
RDW: 12.1 % (ref 11.5–15.5)
WBC: 14.2 10*3/uL — AB (ref 4.0–10.5)

## 2015-09-19 LAB — URINALYSIS, ROUTINE W REFLEX MICROSCOPIC
BILIRUBIN URINE: NEGATIVE
GLUCOSE, UA: NEGATIVE mg/dL
KETONES UR: NEGATIVE mg/dL
Nitrite: NEGATIVE
PH: 5 (ref 5.0–8.0)
PROTEIN: 100 mg/dL — AB
SPECIFIC GRAVITY, URINE: 1.008 (ref 1.005–1.030)

## 2015-09-19 LAB — BASIC METABOLIC PANEL
Anion gap: 7 (ref 5–15)
BUN: 16 mg/dL (ref 6–20)
CALCIUM: 10.1 mg/dL (ref 8.9–10.3)
CO2: 25 mmol/L (ref 22–32)
CREATININE: 0.74 mg/dL (ref 0.44–1.00)
Chloride: 105 mmol/L (ref 101–111)
GFR calc non Af Amer: >60 mL/min (ref >60)
Glucose, Bld: 157 mg/dL — ABNORMAL HIGH (ref 65–99)
Potassium: 4.4 mmol/L (ref 3.5–5.1)
Sodium: 137 mmol/L (ref 135–145)

## 2015-09-19 LAB — URINE MICROSCOPIC-ADD ON

## 2015-09-19 MED ORDER — DEXTROSE 5 % IV SOLN
1.0000 g | Freq: Once | INTRAVENOUS | Status: AC
  Administered 2015-09-19: 1 g via INTRAVENOUS
  Filled 2015-09-19: qty 10

## 2015-09-19 MED ORDER — CEPHALEXIN 500 MG PO CAPS: 500.0000 mg | ORAL_CAPSULE | Freq: Three times a day (TID) | ORAL | 0 refills | Status: DC

## 2015-09-19 MED ORDER — KETOROLAC TROMETHAMINE 30 MG/ML IJ SOLN
30.0000 mg | Freq: Once | INTRAMUSCULAR | Status: AC
  Administered 2015-09-19: 30 mg via INTRAVENOUS
  Filled 2015-09-19: qty 1

## 2015-09-19 NOTE — ED Notes (Signed)
Unsuccessful IV attempt x 2.  Mickel Baas RN to attempt.

## 2015-09-19 NOTE — ED Triage Notes (Signed)
Pt c/o left flank pain onset last night radiating to LLQ abdomen, gross bright red hematuria with blood clot onset today at 0500, pressure with urination. No bleeding when not urinating. No nausea, emesis. Patient has hx of renal calculi, states symptoms feel similar.

## 2015-09-19 NOTE — ED Provider Notes (Signed)
[facility] DEPT Provider Note   CSN: [id number] Arrival date & time: 09/19/15  0840     History   Chief Complaint Chief Complaint  Patient presents with  . Hematuria  . Flank Pain    HPI Jean Robinson is a 60 y.o. female.  Patient presents to emergency department with complaints of lower abdominal discomfort and left flank pain since today.  She had similar symptoms 2 weeks ago that resolved on their own.  She was seen at her doctor's office at that time was told that she had no signs of urinary tract infection.  She has have a history kidney stones but reports no significant pain like her kidney stones.  She does not want any medication for pain at this time patient denies fevers or chills.  Denies nausea vomiting.  Her symptoms are mild to moderate in severity.  What really prompted her visit today she noted significant new hematuria and passage of a very small blood clot from her urine.   The history is provided by the patient and medical records.      Past Medical History:  Diagnosis Date  . Allergy   . Diabetes mellitus   . H/O renal calculi 10/08/09  . Hyperlipidemia   . Hypertension   . STD (sexually transmitted disease)    HSV I & II, IgG reflex positive    Patient Active Problem List   Diagnosis Date Noted  . Urinary frequency 09/04/2015  . Hypomagnesemia 11/16/2014  . Hypokalemia 11/16/2014  . Diabetes type 2, uncontrolled ([place])   . DEPRESSION, MILD 02/27/2008  . Hyperlipidemia LDL goal <100 11/18/2006  . Essential hypertension, benign 11/18/2006  . ALLERGIC RHINITIS 11/18/2006    Past Surgical History:  Procedure Laterality Date  . APPENDECTOMY    . CHOLECYSTECTOMY    . TONSILLECTOMY      OB History    Gravida Para Term Preterm AB Living   4 4 4     4    SAB TAB Ectopic Multiple Live Births                   Home Medications    Prior to Admission medications   Medication Sig Start Date End Date Taking? Authorizing Provider  aspirin 81 MG  tablet Take 81 mg by mouth daily.     Yes Historical Provider, MD  GLIPIZIDE XL 10 MG 24 hr tablet TAKE 1 TABLET BY MOUTH TWICE A DAY Patient taking differently: TAKE 10 MG BY MOUTH TWICE A DAY 08/02/15  Yes Amy E Bedsole, MD  JANUVIA 100 MG tablet TAKE 1 TABLET BY MOUTH DAILY Patient taking differently: TAKE 100 MG BY MOUTH DAILY 08/21/15  Yes Amy E Bedsole, MD  latanoprost (XALATAN) 0.005 % ophthalmic solution Place 1 drop into both eyes at bedtime.  07/26/15  Yes Historical Provider, MD  lisinopril (PRINIVIL,ZESTRIL) 20 MG tablet TAKE 1 TABLET BY MOUTH DAILY Patient taking differently: Take 20 mg by mouth daily.  11/03/14  Yes Cherene Altes, MD  metFORMIN (GLUCOPHAGE-XR) 500 MG 24 hr tablet TAKE TWO (2) TABLETS BY MOUTH 2 TIMES DAILY Patient taking differently: TAKE 1000 MG BY MOUTH 2 TIMES DAILY 08/02/15  Yes Amy E Bedsole, MD  pravastatin (PRAVACHOL) 40 MG tablet TAKE 1 TABLET BY MOUTH DAILY Patient taking differently: TAKE 40 MG BY MOUTH DAILY 08/02/15  Yes Amy E Bedsole, MD  sertraline (ZOLOFT) 25 MG tablet TAKE 1 TABLET BY MOUTH DAILY Patient taking differently: TAKE 25 MG BY MOUTH  DAILY 04/03/15  Yes Amy E Bedsole, MD  valACYclovir (VALTREX) 500 MG tablet TAKE 1 TABLET BY MOUTH DAILY Patient taking differently: Take 500 mg by mouth daily as needed (outbreaks).  03/22/15  Yes Kem Boroughs, FNP           Family History Family History  Problem Relation Age of Onset  . COPD Mother   . Diabetes Mother   . Macular degeneration Mother   . Depression Father   . Diabetes Father   . Diabetes Brother   . Cancer Other     liver  . Diabetes Other     Social History Social History  Substance Use Topics  . Smoking status: Never Smoker  . Smokeless tobacco: Never Used  . Alcohol use Yes     Comment: occassionally     Allergies   Review of patient's allergies indicates no known allergies.   Review of Systems Review of Systems  All other systems reviewed and are  negative.    Physical Exam Updated Vital Signs BP 157/89 (BP Location: Right Arm)   Pulse 86   Temp 97.8 F (36.6 C) (Oral)   Resp 18   SpO2 98%   Physical Exam  Constitutional: She is oriented to person, place, and time. She appears well-developed and well-nourished.  HENT:  Head: Normocephalic.  Eyes: EOM are normal.  Neck: Normal range of motion.  Pulmonary/Chest: Effort normal.  Abdominal: She exhibits no distension.  Mild suprapubic tenderness  Musculoskeletal: Normal range of motion.  Neurological: She is alert and oriented to person, place, and time.  Psychiatric: She has a normal mood and affect.  Nursing note and vitals reviewed.    ED Treatments / Results  Labs (all labs ordered are listed, but only abnormal results are displayed) Labs Reviewed  URINALYSIS, ROUTINE W REFLEX MICROSCOPIC (NOT AT [facility]) - Abnormal; Notable for the following:       Result Value   Color, Urine RED (*)    APPearance CLOUDY (*)    Hgb urine dipstick LARGE (*)    Protein, ur 100 (*)    Leukocytes, UA MODERATE (*)    All other components within normal limits  URINE MICROSCOPIC-ADD ON - Abnormal; Notable for the following:    Squamous Epithelial / LPF 0-5 (*)    Bacteria, UA MANY (*)    Casts HYALINE CASTS (*)    All other components within normal limits  CBC - Abnormal; Notable for the following:    WBC 14.2 (*)    All other components within normal limits  BASIC METABOLIC PANEL - Abnormal; Notable for the following:    Glucose, Bld 157 (*)    All other components within normal limits  URINE CULTURE    EKG  EKG Interpretation None       Radiology Ct Renal Stone Study  Result Date: 09/19/2015 CLINICAL DATA:  60 year old female with left flank pain radiating to the left lower quadrant. Gross hematuria at 0500 hours today. Initial encounter. Personal history of nephrolithiasis. EXAM: CT ABDOMEN AND PELVIS WITHOUT CONTRAST TECHNIQUE: Multidetector CT imaging of the abdomen  and pelvis was performed following the standard protocol without IV contrast. COMPARISON:  CT Abdomen and Pelvis 12/22/2011 FINDINGS: Stable visible lung bases with mild atelectasis or scarring. No pleural or pericardial effusion. Chronic mild grade 1 anterolisthesis in the lower lumbar spine. Osteopenia. No acute osseous abnormality identified. No definite pelvic free fluid. Negative noncontrast uterus and adnexa. Small fat containing inguinal hernias have not significantly  changed. Negative rectum. Negative sigmoid colon aside from mild redundancy and retained stool. Negative left colon. Mildly redundant transverse colon. Negative right colon aside from retained stool. Evidence of prior appendectomy. Negative terminal ileum. No dilated small bowel. Decompressed stomach. Negative duodenum. No abdominal free air or free fluid. Surgically absent gallbladder. Noncontrast liver, spleen, and adrenal glands are within normal limits. The pancreas appears negative aside from small linear calcifications of the pancreatic head which are outside of the CBD. No associated pancreatic mass is evident. These are new since 2013. Aortoiliac calcified atherosclerosis noted. No lymphadenopathy identified. Chronic posterior right renal midpole cyst with simple fluid densitometry. No right hydronephrosis, nephrolithiasis or perinephric stranding. Negative course of the right ureter. No intra renal calculus identified on either side. Chronic left renal upper pole cyst with simple fluid densitometry. No left hydronephrosis. No definite acute perinephric stranding. The left ureter is decompressed. No left periureteral stranding. On series 2 image 71 a small calcification near the course of the distal left ureter is noted but on coronal images this appears to be vascular or related to the round ligament (coronal images 86 and 87). No calcification within the urinary bladder. However, there is circumferential bladder wall thickening and  mild perivesical stranding. IMPRESSION: 1. No obstructive uropathy and no definite urologic calculus. 2. Circumferential bladder wall thickening with mild perivesical stranding suggesting a primary inflammation of the bladder such as due to UTI or cystitis. 3. Several punctate calcifications of the pancreatic head are new since 2013. Favor these are postinflammatory in nature. No evidence of acute pancreatitis or biliary obstruction. 4.  Calcified aortic atherosclerosis. Electronically Signed   By: Genevie Ann M.D.   On: 09/19/2015 10:21    Procedures Procedures (including critical care time)  Medications Ordered in ED Medications  ketorolac (TORADOL) 30 MG/ML injection 30 mg (30 mg Intravenous Given 09/19/15 1047)  cefTRIAXone (ROCEPHIN) 1 g in dextrose 5 % 50 mL IVPB (1 g Intravenous New Bag/Given 09/19/15 1105)     Initial Impression / Assessment and Plan / ED Course  I have reviewed the triage vital signs and the nursing notes.  Pertinent labs & imaging results that were available during my care of the patient were reviewed by me and considered in my medical decision making (see chart for details).  Clinical Course    CT scan negative for acute ureteral stone.  Patient with evidence of hematuria and significant white blood cells.  I suspect this is a hemorrhagic cystitis.  Urine culture sent.  IV Rocephin given in the emergency department.  Discharge home with Keflex.  Primary care follow-up.  Patient understands to return to the ER for new or worsening symptoms  Final Clinical Impressions(s) / ED Diagnoses   Final diagnoses:  Acute cystitis with hematuria  Lower abdominal pain    New Prescriptions New Prescriptions   CEPHALEXIN (KEFLEX) 500 MG CAPSULE    Take 1 capsule (500 mg total) by mouth 3 (three) times daily.     Jola Schmidt, MD 09/19/15 [phone]

## 2015-09-19 NOTE — ED Notes (Signed)
Pt escorted to discharge window, prescriptions, and follow-up. Verbalized understanding discharge instructions. In no acute distress.   

## 2015-09-19 NOTE — ED Notes (Signed)
Patient transported to CT 

## 2015-09-20 LAB — URINE CULTURE: Culture: <10000 — AB

## 2015-09-24 ENCOUNTER — Encounter: Payer: Self-pay | Admitting: Nurse Practitioner

## 2015-09-24 ENCOUNTER — Ambulatory Visit (INDEPENDENT_AMBULATORY_CARE_PROVIDER_SITE_OTHER): Payer: BLUE CROSS/BLUE SHIELD | Admitting: Nurse Practitioner

## 2015-09-24 VITALS — BP 124/72 | HR 64 | Ht 66.0 in | Wt 174.0 lb

## 2015-09-24 DIAGNOSIS — Z01419 Encounter for gynecological examination (general) (routine) without abnormal findings: Secondary | ICD-10-CM

## 2015-09-24 DIAGNOSIS — Z0000 Encounter for general adult medical examination without abnormal findings: Secondary | ICD-10-CM

## 2015-09-24 NOTE — Progress Notes (Signed)
Reviewed personally.  M. Suzanne Kyvon Hu, MD.  

## 2015-09-24 NOTE — Progress Notes (Signed)
Patient ID: Jean Robinson, female   DOB: April 23, 1955, 60 y.o.   MRN: [id number]  60 y.o. [id number] Married  Caucasian Fe here for annual exam.  Hospital with a flu in the fall. Recent low back pain and abdominal pressure and was concerned that she had a kidney stone.  there was no stone and found only bladder infection - but she did have a lot of hematuria.  She is currently on Keflex.     No LMP recorded. Patient is postmenopausal.          Sexually active: Yes.    The current method of family planning is post menopausal status.    Exercising: Yes.    walking Smoker:  no  Health Maintenance: Pap: 02/09/12 normal pap with negative HR HPV MMG: 04/03/15, 3D, Bi-Rads 1: Negative  Colonoscopy: 04/20/07, normal, repeat in 10 years BMD: Never  TDaP: 12/27/06 Shingles: Never Pneumonia: 2004, but will ask for at PCP Hep C: 08/09/15 HIV: postponed by PCP in [place] Labs: 2017 in EPIC   reports that she has never smoked. She has never used smokeless tobacco. She reports that she drinks alcohol. She reports that she does not use drugs.  Past Medical History:  Diagnosis Date  . Allergy   . Diabetes mellitus   . H/O renal calculi 10/08/09  . Hyperlipidemia   . Hypertension   . STD (sexually transmitted disease)    HSV I & II, IgG reflex positive    Past Surgical History:  Procedure Laterality Date  . APPENDECTOMY    . CHOLECYSTECTOMY    . TONSILLECTOMY      Current Outpatient Prescriptions  Medication Sig Dispense Refill  . aspirin 81 MG tablet Take 81 mg by mouth daily.      . cephALEXin (KEFLEX) 500 MG capsule Take 1 capsule (500 mg total) by mouth 3 (three) times daily. 21 capsule 0  . GLIPIZIDE XL 10 MG 24 hr tablet TAKE 1 TABLET BY MOUTH TWICE A DAY (Patient taking differently: TAKE 10 MG BY MOUTH TWICE A DAY) 180 tablet 0  . JANUVIA 100 MG tablet TAKE 1 TABLET BY MOUTH DAILY (Patient taking differently: TAKE 100 MG BY MOUTH DAILY) 90 tablet 3  . latanoprost (XALATAN) 0.005 % ophthalmic  solution Place 1 drop into both eyes at bedtime.     Marland Kitchen lisinopril (PRINIVIL,ZESTRIL) 20 MG tablet TAKE 1 TABLET BY MOUTH DAILY (Patient taking differently: Take 20 mg by mouth daily. ) 90 tablet 3  . metFORMIN (GLUCOPHAGE-XR) 500 MG 24 hr tablet TAKE TWO (2) TABLETS BY MOUTH 2 TIMES DAILY (Patient taking differently: TAKE 1000 MG BY MOUTH 2 TIMES DAILY) 360 tablet 0  . pravastatin (PRAVACHOL) 40 MG tablet TAKE 1 TABLET BY MOUTH DAILY (Patient taking differently: TAKE 40 MG BY MOUTH DAILY) 90 tablet 0  . sertraline (ZOLOFT) 25 MG tablet TAKE 1 TABLET BY MOUTH DAILY (Patient taking differently: TAKE 25 MG BY MOUTH DAILY) 30 tablet 2  . valACYclovir (VALTREX) 500 MG tablet TAKE 1 TABLET BY MOUTH DAILY (Patient taking differently: Take 500 mg by mouth daily as needed (outbreaks). ) 30 tablet 0   No current facility-administered medications for this visit.     Family History  Problem Relation Age of Onset  . COPD Mother   . Diabetes Mother   . Macular degeneration Mother   . Depression Father   . Diabetes Father   . Diabetes Brother   . Cancer Other     liver  .  Diabetes Other     ROS:  Pertinent items are noted in HPI.  Otherwise, a comprehensive ROS was negative.  Exam:   BP 124/72 (BP Location: Right Arm, Patient Position: Sitting, Cuff Size: Normal)   Pulse 64   Ht 5\' 6"  (1.676 m)   Wt 174 lb (78.9 kg)   BMI 28.08 kg/m  Height: 5\' 6"  (167.6 cm) Ht Readings from Last 3 Encounters:  09/24/15 5\' 6"  (1.676 m)  08/09/15 5' 6.5" (1.689 m)  04/04/15 5' 6.5" (1.689 m)    General appearance: alert, cooperative and appears stated age Head: Normocephalic, without obvious abnormality, atraumatic Neck: no adenopathy, supple, symmetrical, trachea midline and thyroid normal to inspection and palpation Lungs: clear to auscultation bilaterally Breasts: normal appearance, no masses or tenderness Heart: regular rate and rhythm Abdomen: soft, non-tender; no masses,  no  organomegaly Extremities: extremities normal, atraumatic, no cyanosis or edema Skin: Skin color, texture, turgor normal. No rashes or lesions Lymph nodes: Cervical, supraclavicular, and axillary nodes normal. No abnormal inguinal nodes palpated Neurologic: Grossly normal   Pelvic: External genitalia:  no lesions              Urethra:  normal appearing urethra with no masses, tenderness or lesions              Bartholin's and Skene's: normal                 Vagina: normal appearing vagina with normal color and discharge, no lesions              Cervix: anteverted              Pap taken: Yes.   Bimanual Exam:  Uterus:  normal size, contour, position, consistency, mobility, non-tender              Adnexa: no mass, fullness, tenderness               Rectovaginal: Confirms               Anus:  normal sphincter tone, no lesions  Chaperone present: yes  A:  Well Woman with normal exam             Postmenopausal             History of renal calculi - recent UTI on meds             History of situational stressors              History of DM - not well controlled              History of HSV I/II              History of hyperlipidemia, HTN,    P:   Reviewed health and wellness pertinent to exam  Pap smear as above  Mammogram Is due 04/2016  She is instructed to see PCP for a follow up urine C&S and micro secondary to previous urine showing RBC TNTC  Counseled on breast self exam, mammography screening, adequate intake of calcium and vitamin D, diet and exercise return annually or prn  An After Visit Summary was printed and given to the patient.

## 2015-09-24 NOTE — Patient Instructions (Addendum)

## 2015-09-26 ENCOUNTER — Encounter: Payer: Self-pay | Admitting: Nurse Practitioner

## 2015-09-26 LAB — IPS PAP TEST WITH HPV

## 2015-10-01 ENCOUNTER — Telehealth: Payer: Self-pay | Admitting: Nurse Practitioner

## 2015-10-01 NOTE — Telephone Encounter (Signed)
Left message on patient's voicemail letting her know that her PCP could put in the orders when she went back for her repeat urine testing. Instructed to call our office with any questions or concerns.   Routing to provider for final review. Patient agreeable to disposition. Will close encounter.

## 2015-10-01 NOTE — Telephone Encounter (Signed)
Patient said she was seen recently and needed to return for some "urine testing". Patient would like to have this done at her pcp and will need an order to have done. Patient said you may leave details on her voicemail.

## 2015-10-02 ENCOUNTER — Telehealth: Payer: Self-pay | Admitting: Nurse Practitioner

## 2015-10-02 ENCOUNTER — Other Ambulatory Visit: Payer: Self-pay | Admitting: Nurse Practitioner

## 2015-10-02 DIAGNOSIS — R319 Hematuria, unspecified: Secondary | ICD-10-CM

## 2015-10-02 NOTE — Telephone Encounter (Signed)
Kem Boroughs, FNP, okay to place orders for patient to have repeat urine culture and micro with PCP? These can be placed in EPIC as patient is seen with [facility] with [employer].

## 2015-10-02 NOTE — Telephone Encounter (Signed)
Please see telephone note from 10/01/15. Patient is requesting to have her follow up urine testing that was recommended by Kem Boroughs, FNP at her PCP's office, [place] [facility], instead of our office. She said they told her our office will need to put an order in, not there's.

## 2015-10-02 NOTE — Telephone Encounter (Signed)
Patient notified via voicemail per DPR that orders have been entered to have labs at PCP office.  Encouraged patient to call with any questions.  Closing encounter.

## 2015-10-02 NOTE — Telephone Encounter (Signed)
Orders are placed for lab collect and to be done at PCP - [place].  She had gross hematuria and UTI treated at ED.  However the urine culture was not impressive.  She was advised to have a follow up TOC to make sure no further bleeding and or infection.

## 2015-10-04 ENCOUNTER — Other Ambulatory Visit: Payer: BLUE CROSS/BLUE SHIELD

## 2015-10-04 DIAGNOSIS — R319 Hematuria, unspecified: Secondary | ICD-10-CM

## 2015-10-05 LAB — URINALYSIS, MICROSCOPIC ONLY
BACTERIA UA: NONE SEEN [HPF]
Casts: NONE SEEN [LPF]
YEAST: NONE SEEN [HPF]

## 2015-10-06 LAB — URINE CULTURE: Organism ID, Bacteria: 10000

## 2015-10-10 ENCOUNTER — Encounter: Payer: Self-pay | Admitting: Family Medicine

## 2015-10-10 ENCOUNTER — Ambulatory Visit (INDEPENDENT_AMBULATORY_CARE_PROVIDER_SITE_OTHER): Payer: BLUE CROSS/BLUE SHIELD | Admitting: Family Medicine

## 2015-10-10 VITALS — BP 140/70 | HR 86 | Temp 98.5°F | Ht 66.0 in | Wt 173.8 lb

## 2015-10-10 DIAGNOSIS — Z23 Encounter for immunization: Secondary | ICD-10-CM

## 2015-10-10 DIAGNOSIS — I10 Essential (primary) hypertension: Secondary | ICD-10-CM

## 2015-10-10 DIAGNOSIS — A6000 Herpesviral infection of urogenital system, unspecified: Secondary | ICD-10-CM | POA: Insufficient documentation

## 2015-10-10 DIAGNOSIS — R319 Hematuria, unspecified: Secondary | ICD-10-CM | POA: Diagnosis not present

## 2015-10-10 MED ORDER — VALACYCLOVIR HCL 500 MG PO TABS: 500.0000 mg | ORAL_TABLET | Freq: Two times a day (BID) | ORAL | 0 refills | Status: AC | PRN

## 2015-10-10 NOTE — Assessment & Plan Note (Signed)
Rare outbreaks. Refilled medications to use prn.

## 2015-10-10 NOTE — Progress Notes (Signed)
60 year old female presents for follow up DM.  Seen in ED on 8/17: CT abd/pelvis neg.  Dx: hemorrhagic cystitis.  Urine culture sent.  IV Rocephin given in the emergency department.  Discharge home with Keflex.  8/17 neg 9/3 Ucx was negative. 9/3 neg UA. No longer any blood in urine. Likely felt passed a stone, as cause of bleeding. .   Needs refill of valacyclovir for genital herpes outbreaks.  Hypertension: Well controlled on lisinopril  BP Readings from Last 3 Encounters:  10/10/15 140/70  09/24/15 124/72  09/19/15 133/83   Using medication without problems or lightheadedness:  Chest pain with exertion: None Edema:None  Short of breath:None  Average home BPs: 120/70  Other issues:     Social History /Family History/Past Medical History reviewed and updated if needed.  Review of Systems  Occ intermittant tightness in upper back. Sees massage therapist. NO numbness weakness, no current pain. All other systems reviewed and are negative.  Objective:   Physical Exam  Constitutional: Vital signs are normal. She appears well-developed and well-nourished. She is cooperative. Non-toxic appearance. She does not appear ill. No distress.  HENT:  Head: Normocephalic.  Right Ear: Hearing, tympanic membrane, external ear and ear canal normal. Tympanic membrane is not erythematous, not retracted and not bulging.  Left Ear: Hearing, tympanic membrane, external ear and ear canal normal. Tympanic membrane is not erythematous, not retracted and not bulging.  Nose: No mucosal edema or rhinorrhea. Right sinus exhibits no maxillary sinus tenderness and no frontal sinus tenderness. Left sinus exhibits no maxillary sinus tenderness and no frontal sinus tenderness.  Mouth/Throat: Uvula is midline, oropharynx is clear and moist and mucous membranes are normal.  Eyes: Conjunctivae, EOM and lids are normal. Pupils are equal, round, and reactive to light. No foreign bodies found.   Neck: Trachea normal and normal range of motion. Neck supple. Carotid bruit is not present. No mass and no thyromegaly present.  Cardiovascular: Normal rate, regular rhythm, S1 normal, S2 normal, normal heart sounds, intact distal pulses and normal pulses. Exam reveals no gallop and no friction rub.  No murmur heard.  Pulmonary/Chest: Effort normal and breath sounds normal. Not tachypneic. No respiratory distress. She has no decreased breath sounds. She has no wheezes. She has no rhonchi. She has no rales.  Abdominal: Soft. Normal appearance and bowel sounds are normal. There is no tenderness.  Neurological: She is alert.  Skin: Skin is warm, dry and intact. No rash noted.  Psychiatric: Her speech is normal and behavior is normal. Judgment and thought content normal. Her mood appears not anxious. Cognition and memory are normal. She does not exhibit a depressed mood.  MSK: full ROM in neck and B shoulders, neg spurling, no ttp

## 2015-10-10 NOTE — Progress Notes (Signed)
Pre visit review using our clinic review tool, if applicable. No additional management support is needed unless otherwise documented below in the visit note. 

## 2015-10-10 NOTE — Assessment & Plan Note (Signed)
Well controlled. Continue current medication.  

## 2015-10-10 NOTE — Assessment & Plan Note (Signed)
Resolved on recent UA.Marland Kitchen Likely due to stone not infection.

## 2015-10-10 NOTE — Addendum Note (Signed)
Addended by: Carter Kitten on: 10/10/2015 12:26 PM   Modules accepted: Orders

## 2015-10-22 ENCOUNTER — Other Ambulatory Visit: Payer: Self-pay | Admitting: Family Medicine

## 2015-10-24 ENCOUNTER — Other Ambulatory Visit: Payer: Self-pay | Admitting: Family Medicine

## 2016-01-31 ENCOUNTER — Other Ambulatory Visit: Payer: Self-pay

## 2016-01-31 MED ORDER — LISINOPRIL 20 MG PO TABS: ORAL_TABLET | ORAL | 1 refills | Status: DC

## 2016-01-31 NOTE — Telephone Encounter (Signed)
Pt request refill lisinopril to [facility]; refilled per protocol; pt seen 10/10/15. Pt will ck with pharmacy.

## 2016-04-07 LAB — HM DIABETES EYE EXAM

## 2016-04-10 ENCOUNTER — Other Ambulatory Visit: Payer: Self-pay | Admitting: Family Medicine

## 2016-04-13 ENCOUNTER — Encounter: Payer: Self-pay | Admitting: Family Medicine

## 2016-05-04 ENCOUNTER — Other Ambulatory Visit: Payer: Self-pay | Admitting: Family Medicine

## 2016-05-13 ENCOUNTER — Telehealth: Payer: Self-pay

## 2016-05-13 MED ORDER — ONETOUCH DELICA LANCING DEV MISC: 0 refills | Status: DC

## 2016-05-13 NOTE — Telephone Encounter (Signed)
Pt left v/m requesting new lancet device;old device was lost on a trip. Bea at [facility] suggested onetouch delica lancet device.

## 2016-07-30 ENCOUNTER — Other Ambulatory Visit: Payer: Self-pay | Admitting: Family Medicine

## 2016-08-03 ENCOUNTER — Other Ambulatory Visit (INDEPENDENT_AMBULATORY_CARE_PROVIDER_SITE_OTHER): Payer: BLUE CROSS/BLUE SHIELD

## 2016-08-03 ENCOUNTER — Other Ambulatory Visit: Payer: Self-pay | Admitting: Family Medicine

## 2016-08-03 DIAGNOSIS — I10 Essential (primary) hypertension: Secondary | ICD-10-CM

## 2016-08-03 DIAGNOSIS — E784 Other hyperlipidemia: Secondary | ICD-10-CM | POA: Diagnosis not present

## 2016-08-03 DIAGNOSIS — E559 Vitamin D deficiency, unspecified: Secondary | ICD-10-CM | POA: Diagnosis not present

## 2016-08-03 DIAGNOSIS — E1100 Type 2 diabetes mellitus with hyperosmolarity without nonketotic hyperglycemic-hyperosmolar coma (NKHHC): Secondary | ICD-10-CM | POA: Diagnosis not present

## 2016-08-03 DIAGNOSIS — E7849 Other hyperlipidemia: Secondary | ICD-10-CM

## 2016-08-03 LAB — CBC WITH DIFFERENTIAL/PLATELET
Basophils Absolute: 0.1 10*3/uL (ref 0.0–0.1)
Basophils Relative: 0.7 % (ref 0.0–3.0)
EOS PCT: 2.9 % (ref 0.0–5.0)
Eosinophils Absolute: 0.2 10*3/uL (ref 0.0–0.7)
HCT: 41.6 % (ref 36.0–46.0)
Hemoglobin: 14.3 g/dL (ref 12.0–15.0)
LYMPHS ABS: 3.2 10*3/uL (ref 0.7–4.0)
Lymphocytes Relative: 40.4 % (ref 12.0–46.0)
MCHC: 34.4 g/dL (ref 30.0–36.0)
MCV: 94.6 fl (ref 78.0–100.0)
MONOS PCT: 7.9 % (ref 3.0–12.0)
Monocytes Absolute: 0.6 10*3/uL (ref 0.1–1.0)
NEUTROS ABS: 3.8 10*3/uL (ref 1.4–7.7)
NEUTROS PCT: 48.1 % (ref 43.0–77.0)
PLATELETS: 259 10*3/uL (ref 150.0–400.0)
RBC: 4.4 Mil/uL (ref 3.87–5.11)
RDW: 13 % (ref 11.5–15.5)
WBC: 7.8 10*3/uL (ref 4.0–10.5)

## 2016-08-03 LAB — COMPREHENSIVE METABOLIC PANEL
ALK PHOS: 66 U/L (ref 39–117)
ALT: 32 U/L (ref 0–35)
AST: 26 U/L (ref 0–37)
Albumin: 4.3 g/dL (ref 3.5–5.2)
BUN: 12 mg/dL (ref 6–23)
CALCIUM: 10.6 mg/dL — AB (ref 8.4–10.5)
CO2: 27 meq/L (ref 19–32)
Chloride: 103 mEq/L (ref 96–112)
Creatinine, Ser: 0.84 mg/dL (ref 0.40–1.20)
GFR: 72.79 mL/min (ref >60.00)
GLUCOSE: 177 mg/dL — AB (ref 70–99)
POTASSIUM: 4.6 meq/L (ref 3.5–5.1)
Sodium: 138 mEq/L (ref 135–145)
TOTAL PROTEIN: 6.9 g/dL (ref 6.0–8.3)
Total Bilirubin: 0.6 mg/dL (ref 0.2–1.2)

## 2016-08-03 LAB — LIPID PANEL
CHOLESTEROL: 203 mg/dL — AB (ref 0–200)
HDL: 49.2 mg/dL (ref >39.00)
LDL Cholesterol: 122 mg/dL — ABNORMAL HIGH (ref 0–99)
NonHDL: 153.34
Total CHOL/HDL Ratio: 4
Triglycerides: 157 mg/dL — ABNORMAL HIGH (ref 0.0–149.0)
VLDL: 31.4 mg/dL (ref 0.0–40.0)

## 2016-08-03 LAB — MAGNESIUM: Magnesium: 1.2 mg/dL — ABNORMAL LOW (ref 1.5–2.5)

## 2016-08-03 LAB — HEMOGLOBIN A1C: Hgb A1c MFr Bld: 9.1 % — ABNORMAL HIGH (ref 4.6–6.5)

## 2016-08-03 LAB — VITAMIN D 25 HYDROXY (VIT D DEFICIENCY, FRACTURES): VITD: 40.06 ng/mL (ref 30.00–100.00)

## 2016-08-06 ENCOUNTER — Other Ambulatory Visit: Payer: Self-pay

## 2016-08-06 MED ORDER — METFORMIN HCL ER 500 MG PO TB24: ORAL_TABLET | ORAL | 0 refills | Status: DC

## 2016-08-06 NOTE — Telephone Encounter (Signed)
Pt has appt next week to be seen but will be out of metformin this weekend; pt not sure if ins requires 3 month rx or not. Refilled x 1 until f/u appt. [place].

## 2016-08-07 ENCOUNTER — Ambulatory Visit: Payer: BLUE CROSS/BLUE SHIELD | Admitting: Family Medicine

## 2016-08-11 ENCOUNTER — Telehealth: Payer: Self-pay | Admitting: Obstetrics & Gynecology

## 2016-08-11 ENCOUNTER — Ambulatory Visit (INDEPENDENT_AMBULATORY_CARE_PROVIDER_SITE_OTHER): Payer: BLUE CROSS/BLUE SHIELD | Admitting: Family Medicine

## 2016-08-11 ENCOUNTER — Encounter: Payer: Self-pay | Admitting: Family Medicine

## 2016-08-11 ENCOUNTER — Ambulatory Visit: Payer: BLUE CROSS/BLUE SHIELD | Admitting: Family Medicine

## 2016-08-11 VITALS — BP 110/70 | HR 96 | Temp 97.7°F | Ht 66.0 in | Wt 177.8 lb

## 2016-08-11 DIAGNOSIS — E1165 Type 2 diabetes mellitus with hyperglycemia: Secondary | ICD-10-CM

## 2016-08-11 DIAGNOSIS — M65331 Trigger finger, right middle finger: Secondary | ICD-10-CM | POA: Diagnosis not present

## 2016-08-11 DIAGNOSIS — E785 Hyperlipidemia, unspecified: Secondary | ICD-10-CM

## 2016-08-11 DIAGNOSIS — IMO0001 Reserved for inherently not codable concepts without codable children: Secondary | ICD-10-CM

## 2016-08-11 DIAGNOSIS — I10 Essential (primary) hypertension: Secondary | ICD-10-CM

## 2016-08-11 DIAGNOSIS — A6004 Herpesviral vulvovaginitis: Secondary | ICD-10-CM | POA: Diagnosis not present

## 2016-08-11 LAB — HM DIABETES FOOT EXAM

## 2016-08-11 MED ORDER — VALACYCLOVIR HCL 500 MG PO TABS: 500.0000 mg | ORAL_TABLET | Freq: Two times a day (BID) | ORAL | 0 refills | Status: DC

## 2016-08-11 NOTE — Assessment & Plan Note (Signed)
Worsened control with decrease in use of statin.. Increase back to 4-5 days per week.

## 2016-08-11 NOTE — Progress Notes (Signed)
Subjective:    Patient ID: Jean Robinson, female    DOB: [birth year], 61 y.o.   MRN: [id number]  HPI   61 year old female presents for follow up.  She has been having pain and swelling in right 3rd  MCP joint. occ gets stuck triggers. No redness, no joint deformity.  She has used  I right trigger fingerbuprofen with minimal benefit.  Hypertension:    BP Readings from Last 3 Encounters:  08/11/16 110/70  10/10/15 140/70  09/24/15 124/72  Using medication without problems or lightheadedness:  none Chest pain with exertion: none Edema: none Short of breath: none Average home BPs: good Other issues:   vit D is nml.   Needs refill of valacyclovir for genital herpes  Flares, not prevention.   Diabetes:   Poor control on metformin max, januvia max and glipizide max.  She has been trying to eat low carb.. Still does have some carbohydrates. Lab Results  Component Value Date   HGBA1C 9.1 (H) 08/03/2016  Using medications without difficulties: Hypoglycemic episodes: Hyperglycemic episodes: Feet problems: Blood Sugars averaging: eye exam within last year:  Elevated Cholesterol:   Worsened control LDL not at goal < 100 on  pravachol.  She has decreased pravastain in last year to 2 a week.. Was doing  Better on higher dose. Lab Results  Component Value Date   CHOL 203 (H) 08/03/2016   HDL 49.20 08/03/2016   LDLCALC 122 (H) 08/03/2016   TRIG 157.0 (H) 08/03/2016   CHOLHDL 4 08/03/2016  Using medications without problems: Muscle aches:  Diet compliance: moderateExercise: none Other complaints:   Review of Systems  Constitutional: Negative for fatigue and fever.  HENT: Negative for congestion.   Eyes: Negative for pain.  Respiratory: Negative for cough and shortness of breath.   Cardiovascular: Negative for chest pain, palpitations and leg swelling.  Gastrointestinal: Negative for abdominal pain.  Genitourinary: Negative for dysuria and vaginal bleeding.  Musculoskeletal:  Negative for back pain.  Neurological: Negative for syncope, light-headedness and headaches.  Psychiatric/Behavioral: Negative for dysphoric mood.       Objective:   Physical Exam  Constitutional: Vital signs are normal. She appears well-developed and well-nourished. She is cooperative.  Non-toxic appearance. She does not appear ill. No distress.  Central obesity  HENT:  Head: Normocephalic.  Right Ear: Hearing, tympanic membrane, external ear and ear canal normal. Tympanic membrane is not erythematous, not retracted and not bulging.  Left Ear: Hearing, tympanic membrane, external ear and ear canal normal. Tympanic membrane is not erythematous, not retracted and not bulging.  Nose: No mucosal edema or rhinorrhea. Right sinus exhibits no maxillary sinus tenderness and no frontal sinus tenderness. Left sinus exhibits no maxillary sinus tenderness and no frontal sinus tenderness.  Mouth/Throat: Uvula is midline, oropharynx is clear and moist and mucous membranes are normal.  Eyes: Conjunctivae, EOM and lids are normal. Pupils are equal, round, and reactive to light. Lids are everted and swept, no foreign bodies found.  Neck: Trachea normal and normal range of motion. Neck supple. Carotid bruit is not present. No thyroid mass and no thyromegaly present.  Cardiovascular: Normal rate, regular rhythm, S1 normal, S2 normal, normal heart sounds, intact distal pulses and normal pulses.  Exam reveals no gallop and no friction rub.   No murmur heard. Pulmonary/Chest: Effort normal and breath sounds normal. No tachypnea. No respiratory distress. She has no decreased breath sounds. She has no wheezes. She has no rhonchi. She has no rales.  Abdominal: Soft. Normal appearance and bowel sounds are normal. There is no tenderness.  Neurological: She is alert.  Skin: Skin is warm, dry and intact. No rash noted.  Psychiatric: Her speech is normal and behavior is normal. Judgment and thought content normal. Her  mood appears not anxious. Cognition and memory are normal. She does not exhibit a depressed mood.     Diabetic foot exam: Normal inspection No skin breakdown Bilateral calluses  Normal DP pulses Normal sensation to light touch and monofilament Nails normal      Assessment & Plan:

## 2016-08-11 NOTE — Assessment & Plan Note (Signed)
Replete

## 2016-08-11 NOTE — Assessment & Plan Note (Signed)
Refill for flares as requested. Not interested in prophylaxis as not  flareing frequently per year

## 2016-08-11 NOTE — Patient Instructions (Addendum)
Increase back up to pravastatin 4-5 as able.  Work on low cholesterol and low carb diet. Increase exercise as able, yoga would be good.  Finger pain: Splint finger and treat with ibuprofen.. If not improving consider referral for steroid injection. Hold mulitvitamin... Given calcium high.

## 2016-08-11 NOTE — Assessment & Plan Note (Signed)
Well controlled. Continue current medication. Encouraged exercise, weight loss, healthy eating habits.  

## 2016-08-11 NOTE — Assessment & Plan Note (Signed)
Poor control on 3 max oral meds. Recommended insulin.  pt will work on aggressive diet changes and add exercise/. Re-eval in 3 months. If not at goal.. Will start insulin or consider med like trulicity.

## 2016-08-11 NOTE — Telephone Encounter (Signed)
Left message on voicemail to call and reschedule cancelled appointment. Mail letter °

## 2016-08-11 NOTE — Assessment & Plan Note (Signed)
Splint finger and treat with NSAIDS.. If not improving consider referral for steroid injection.

## 2016-08-18 ENCOUNTER — Other Ambulatory Visit: Payer: Self-pay | Admitting: Family Medicine

## 2016-08-31 IMAGING — CT CT RENAL STONE PROTOCOL
2 of 3 series · 15 of 46 positions shown, 17 images · non-contrast
Comparison: CT Abdomen and Pelvis 12/22/2011

CLINICAL DATA: 60-year-old female with left flank pain radiating to
the left lower quadrant. Gross hematuria at 7477 hours today.
Initial encounter. Personal history of nephrolithiasis.

EXAM:
CT ABDOMEN AND PELVIS WITHOUT CONTRAST
TECHNIQUE: Multidetector CT imaging of the abdomen and pelvis was performed
following the standard protocol without IV contrast.

[Series 4: lung · axial · 0.84mm/px · z∈[-39,+43]mm · 12 of 47 slices shown, 14 images]
[im 3/47  soft-tissue]
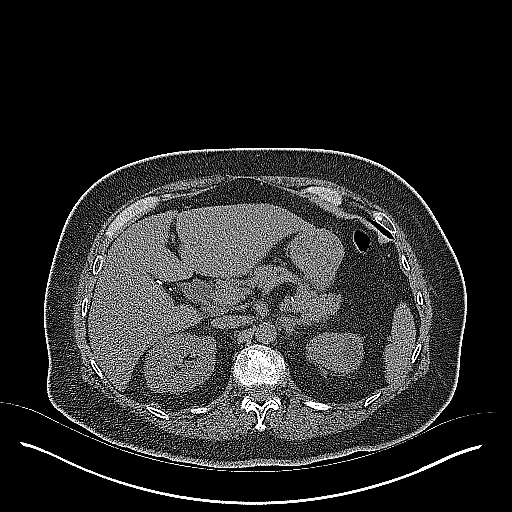
[im 3/47  bone]
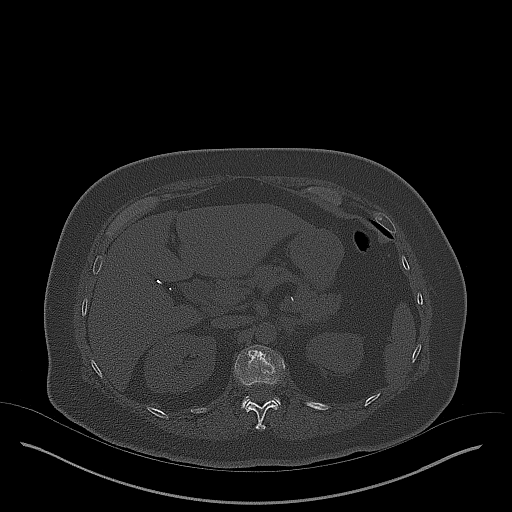
[im 6/47  soft-tissue]
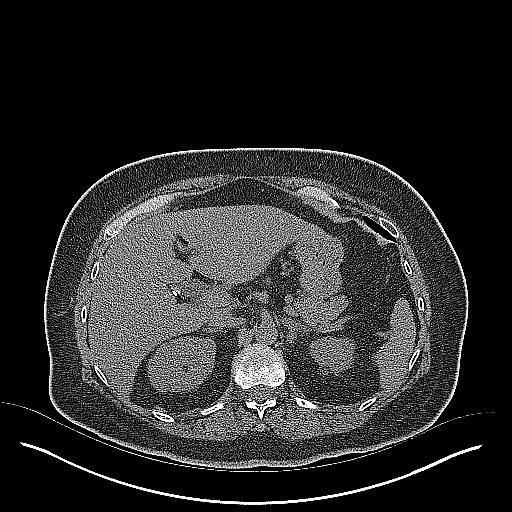
[im 11/47  soft-tissue]
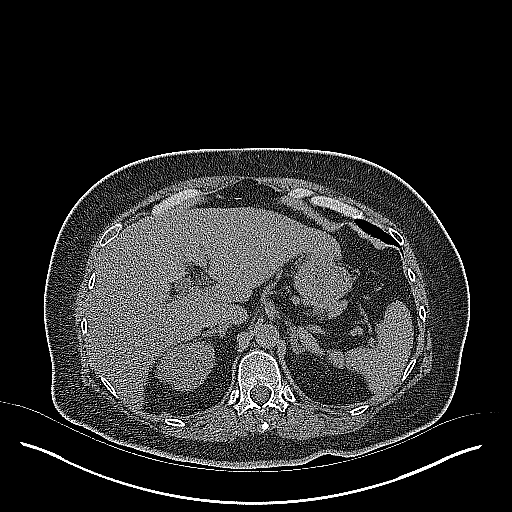
[im 14/47  soft-tissue]
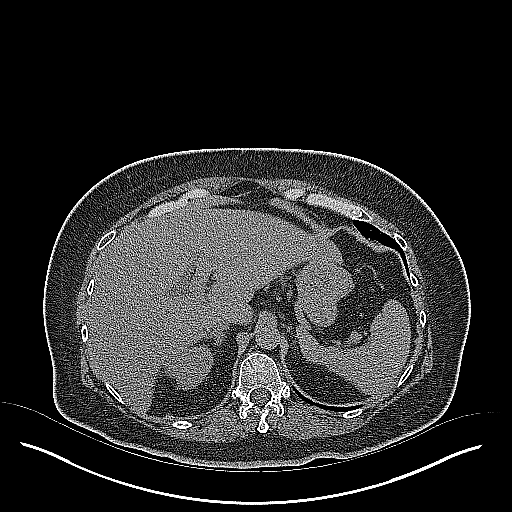
[im 18/47  soft-tissue]
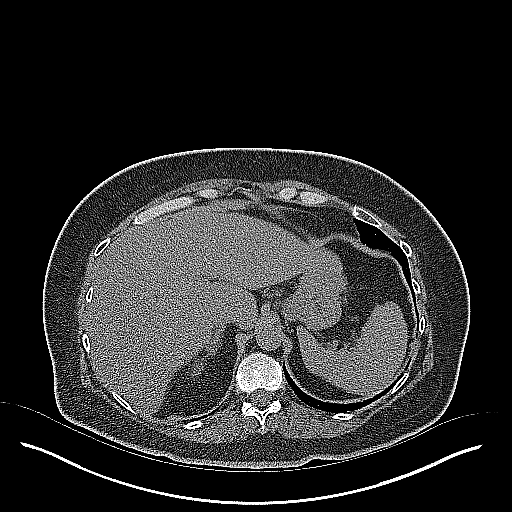
[im 21/47  soft-tissue]
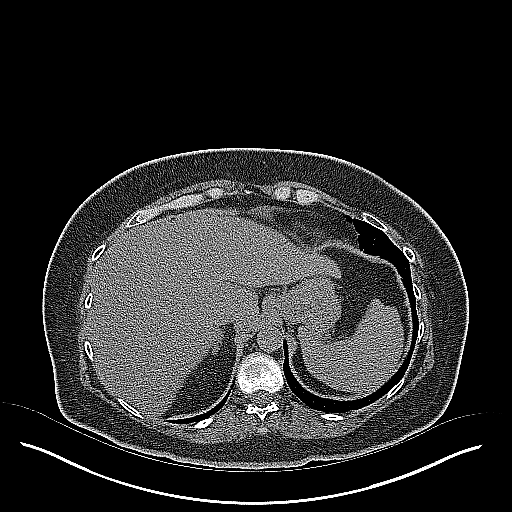
[im 26/47  soft-tissue]
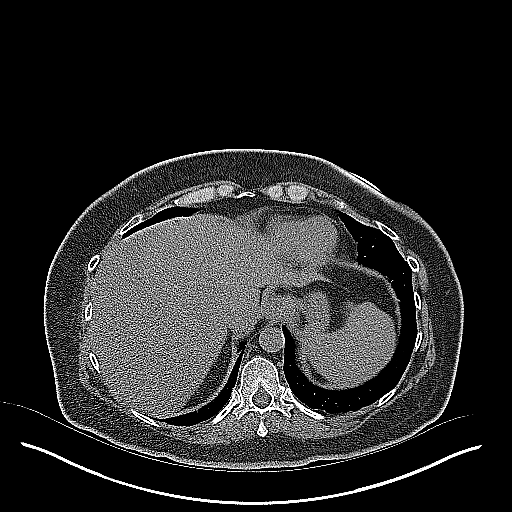
[im 29/47  soft-tissue]
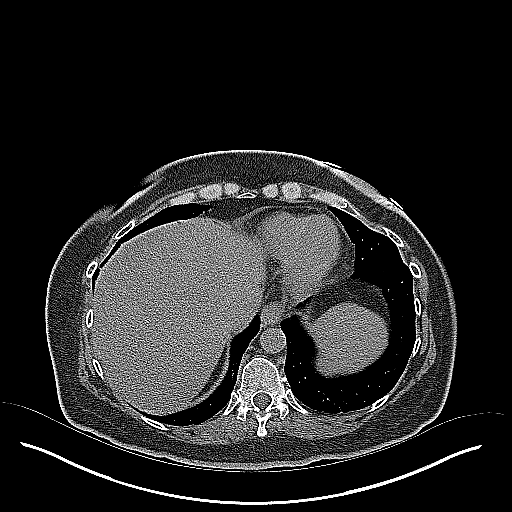
[im 33/47  soft-tissue]
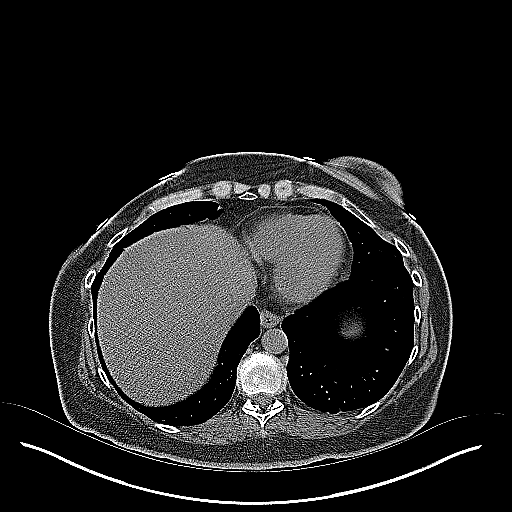
[im 33/47  bone]
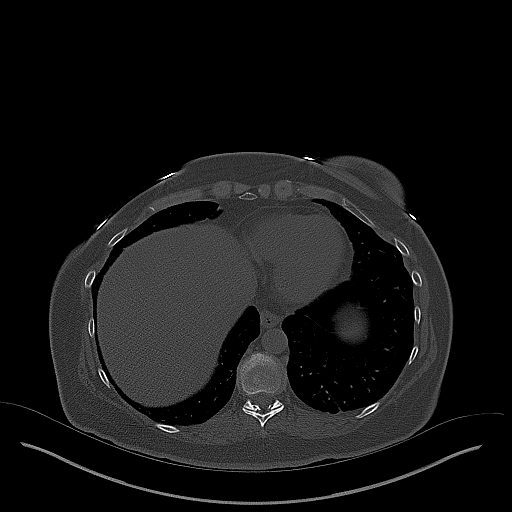
[im 36/47  soft-tissue]
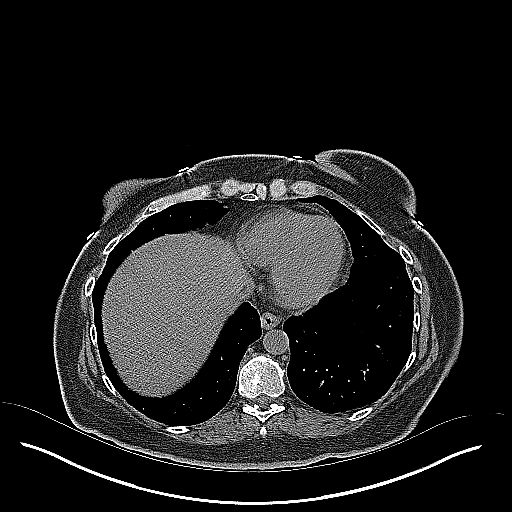
[im 41/47  soft-tissue]
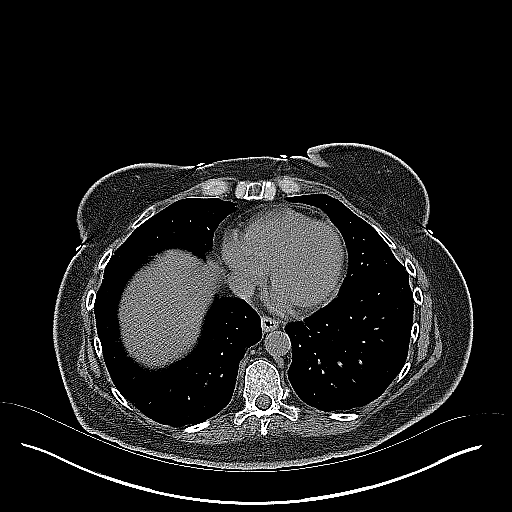
[im 44/47  soft-tissue]
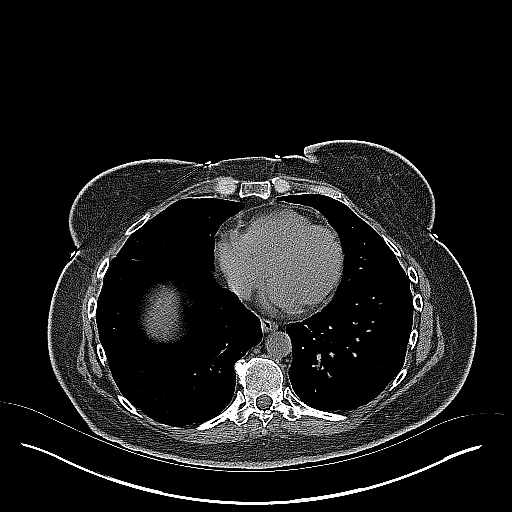

[Series 5: coronal · coronal · 0.80mm/px · 3 of 139 slices shown]
[im 47/139  soft-tissue]
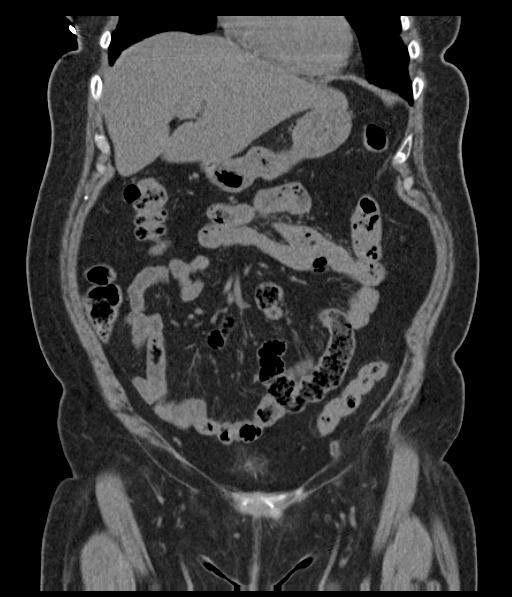
[im 62/139  soft-tissue]
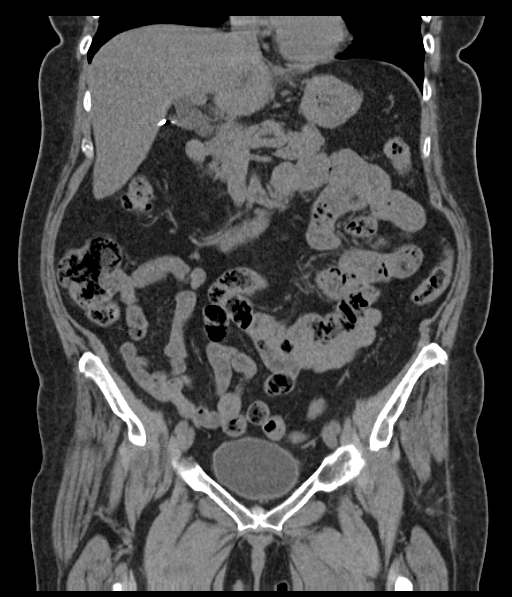
[im 77/139  soft-tissue]
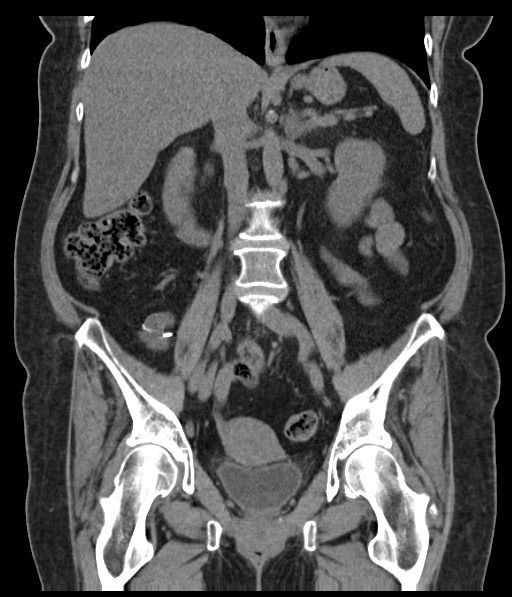

[15 of 46 positions shown; findings below may reference images not displayed]

FINDINGS: Stable visible lung bases with mild atelectasis or scarring. No
pleural or pericardial effusion.

Chronic mild grade 1 anterolisthesis in the lower lumbar spine.
Osteopenia. No acute osseous abnormality identified.

No definite pelvic free fluid. Negative noncontrast uterus and
adnexa. Small fat containing inguinal hernias have not significantly
changed. Negative rectum.

Negative sigmoid colon aside from mild redundancy and retained
stool. Negative left colon. Mildly redundant transverse colon.
Negative right colon aside from retained stool. Evidence of prior
appendectomy. Negative terminal ileum. No dilated small bowel.
Decompressed stomach. Negative duodenum.

No abdominal free air or free fluid. Surgically absent gallbladder.
Noncontrast liver, spleen, and adrenal glands are within normal
limits. The pancreas appears negative aside from small linear
calcifications of the pancreatic head which are outside of the CBD.
No associated pancreatic mass is evident. These are new since 2014.

Aortoiliac calcified atherosclerosis noted. No lymphadenopathy
identified.

Chronic posterior right renal midpole cyst with simple fluid
densitometry. No right hydronephrosis, nephrolithiasis or
perinephric stranding. Negative course of the right ureter.

No intra renal calculus identified on either side.

Chronic left renal upper pole cyst with simple fluid densitometry.
No left hydronephrosis. No definite acute perinephric stranding. The
left ureter is decompressed. No left periureteral stranding. On
series 2 image 71 a small calcification near the course of the
distal left ureter is noted but on coronal images this appears to be
vascular or related to the round ligament (coronal images 86 and
87). No calcification within the urinary bladder. However, there is
circumferential bladder wall thickening and mild perivesical
stranding.
IMPRESSION: 1. No obstructive uropathy and no definite urologic calculus.
2. Circumferential bladder wall thickening with mild perivesical
stranding suggesting a primary inflammation of the bladder such as
due to UTI or cystitis.
3. Several punctate calcifications of the pancreatic head are new
since 2014. Favor these are postinflammatory in nature. No evidence
of acute pancreatitis or biliary obstruction.
4.  Calcified aortic atherosclerosis.

## 2016-09-14 ENCOUNTER — Telehealth: Payer: Self-pay | Admitting: *Deleted

## 2016-09-14 NOTE — Telephone Encounter (Signed)
Amy at [facility] left a voicemail stating that patient has refills left on her test strips. Amy stated that patient has switched over to a Verio meter and she is going to switch the remaining refills over for the test strips to the Verio and wants approval from Dr. Diona Browner to do that.

## 2016-09-15 NOTE — Telephone Encounter (Signed)
Okay to make change.. Provide test strip refills if needed for verio meter.

## 2016-09-15 NOTE — Telephone Encounter (Signed)
Jean Robinson was no longer at the pharmacy when I returned her call. I spoke with Arville Go who will assure that the message gets to her and also will find out about if refills for strips are needed for patient's new verio meter. Will await call back

## 2016-09-17 ENCOUNTER — Other Ambulatory Visit: Payer: Self-pay | Admitting: Family Medicine

## 2016-09-17 NOTE — Telephone Encounter (Signed)
Spoke with [name] at the pharmacy and she stated that Jean Robinson was not going to be back in the office until Monday and she stated that they did switch the remaining strips to her new meter. She stated that we do not need to send any strips in at this time. Nothing additional is needed

## 2016-09-25 ENCOUNTER — Ambulatory Visit: Payer: BLUE CROSS/BLUE SHIELD | Admitting: Nurse Practitioner

## 2016-09-25 NOTE — Progress Notes (Deleted)
61 y.o. [id number] Married  Caucasian Fe here for annual exam.    Patient's last menstrual period was 02/02/2006.          Sexually active: {yes no:314532}  The current method of family planning is post menopausal status.    Exercising: {yes no:314532}  {types:19826} Smoker:  {YES NO:22349}  Health Maintenance: Pap:  02-09-12 neg HPV HR neg, 09-24-15 neg HPV HR neg History of Abnormal Pap: {YES NO:22349} MMG:  04-03-15 category b density birads 1:neg Self Breast exams: {YES NO:22349} Colonoscopy:  2009 f/u 39yrs BMD:   none TDaP:  2008 Shingles: 2017 Pneumonia: 2004 Hep C and HIV: *** Labs: ***   reports that she has never smoked. She has never used smokeless tobacco. She reports that she drinks alcohol. She reports that she does not use drugs.  Past Medical History:  Diagnosis Date  . Allergy   . Diabetes mellitus   . H/O renal calculi 10/08/09  . Hyperlipidemia   . Hypertension   . STD (sexually transmitted disease)    HSV I & II, IgG reflex positive    Past Surgical History:  Procedure Laterality Date  . APPENDECTOMY    . CHOLECYSTECTOMY    . TONSILLECTOMY      Current Outpatient Prescriptions  Medication Sig Dispense Refill  . aspirin 81 MG tablet Take 81 mg by mouth daily.      Marland Kitchen glipiZIDE (GLUCOTROL XL) 10 MG 24 hr tablet TAKE 1 TABLET BY MOUTH TWICE A DAY 180 tablet 0  . JANUVIA 100 MG tablet TAKE 1 TABLET BY MOUTH DAILY 90 tablet 1  . [employer] (ONE TOUCH DELICA LANCING DEV) MISC Check blood sugar twice a day and as directed. Dx E11.65 1 each 0  . latanoprost (XALATAN) 0.005 % ophthalmic solution Place 1 drop into both eyes at bedtime.     Marland Kitchen lisinopril (PRINIVIL,ZESTRIL) 20 MG tablet TAKE 1 TABLET BY MOUTH DAILY 90 tablet 0  . metFORMIN (GLUCOPHAGE-XR) 500 MG 24 hr tablet TAKE TWO (2) TABLETS BY MOUTH 2 TIMES DAILY 360 tablet 0  . ONE TOUCH ULTRA TEST test strip USE TO CHECK BLOOD SUGAR 2 TIMES DAILY AS DIRECTED 100 each 6  . pravastatin (PRAVACHOL) 40 MG tablet  TAKE 1 TABLET BY MOUTH DAILY (Patient taking differently: TAKE 40 MG BY MOUTH DAILY) 90 tablet 0  . sertraline (ZOLOFT) 25 MG tablet TAKE 1 TABLET BY MOUTH DAILY 90 tablet 1  . valACYclovir (VALTREX) 500 MG tablet Take 1 tablet (500 mg total) by mouth 2 (two) times daily. Take for 3 days for acute flare 24 tablet 0   No current facility-administered medications for this visit.     Family History  Problem Relation Age of Onset  . COPD Mother   . Diabetes Mother   . Macular degeneration Mother   . Depression Father   . Diabetes Father   . Diabetes Brother   . Cancer Other        liver  . Diabetes Other     ROS:  Pertinent items are noted in HPI.  Otherwise, a comprehensive ROS was negative.  Exam:   LMP 02/02/2006    Ht Readings from Last 3 Encounters:  08/11/16 5\' 6"  (1.676 m)  10/10/15 5\' 6"  (1.676 m)  09/24/15 5\' 6"  (1.676 m)    General appearance: alert, cooperative and appears stated age Head: Normocephalic, without obvious abnormality, atraumatic Neck: no adenopathy, supple, symmetrical, trachea midline and thyroid {EXAM; THYROID:18604} Lungs: clear to auscultation bilaterally  Breasts: {Exam; breast:13139::"normal appearance, no masses or tenderness"} Heart: regular rate and rhythm Abdomen: soft, non-tender; no masses,  no organomegaly Extremities: extremities normal, atraumatic, no cyanosis or edema Skin: Skin color, texture, turgor normal. No rashes or lesions Lymph nodes: Cervical, supraclavicular, and axillary nodes normal. No abnormal inguinal nodes palpated Neurologic: Grossly normal   Pelvic: External genitalia:  no lesions              Urethra:  normal appearing urethra with no masses, tenderness or lesions              Bartholin's and Skene's: normal                 Vagina: normal appearing vagina with normal color and discharge, no lesions              Cervix: {exam; cervix:14595}              Pap taken: {yes no:314532} Bimanual Exam:  Uterus:  {exam;  uterus:12215}              Adnexa: {exam; adnexa:12223}               Rectovaginal: Confirms               Anus:  normal sphincter tone, no lesions  Chaperone present: ***  A:  Well Woman with normal exam  P:   Reviewed health and wellness pertinent to exam  Pap smear: {YES NO:22349}  {plan; gyn:5269::"mammogram","pap smear","return annually or prn"}  An After Visit Summary was printed and given to the patient.

## 2016-09-29 ENCOUNTER — Ambulatory Visit: Payer: BLUE CROSS/BLUE SHIELD | Admitting: Certified Nurse Midwife

## 2016-10-06 ENCOUNTER — Ambulatory Visit: Payer: BLUE CROSS/BLUE SHIELD | Admitting: Certified Nurse Midwife

## 2016-10-15 ENCOUNTER — Encounter (INDEPENDENT_AMBULATORY_CARE_PROVIDER_SITE_OTHER): Payer: BLUE CROSS/BLUE SHIELD | Admitting: Certified Nurse Midwife

## 2016-10-15 ENCOUNTER — Encounter: Payer: Self-pay | Admitting: Certified Nurse Midwife

## 2016-10-15 NOTE — Progress Notes (Signed)
Pt had to leave & go pick up grandson from school. School closed early due to weather.

## 2016-10-16 ENCOUNTER — Ambulatory Visit: Payer: BLUE CROSS/BLUE SHIELD | Admitting: Certified Nurse Midwife

## 2016-10-26 NOTE — Progress Notes (Signed)
61 y.o. [id number] Married Caucasian female here for annual exam.    No vaginal bleeding or spotting.   Can leak urine if jumps on trampoline.    Labs with PCP.   Works at [employer] and takes care of grandchildren.  Expecting a grandchild in October.   PCP:   Dr. Diona Browner  Patient's last menstrual period was 02/02/2006.           Sexually active: Yes.    The current method of family planning is post menopausal status.    Exercising: Yes.    The patient has a physically strenuous job, but has no regular exercise apart from work.  Smoker:  no  Health Maintenance: Pap: 09/24/15 Pap and HR HPV negative 02/09/12 normal pap with negative HR HPV History of abnormal Pap:  no MMG:  04/03/15, 3D, Bi-Rads 1: Negative.   Colonoscopy:  04/20/07, normal, repeat in 10 years BMD:  Never TDaP: 12/27/06 Gardasil:   no HIV and Hep C: 08/09/15 Negative  Screening Labs:  PCP takes care of labs   reports that she has never smoked. She has never used smokeless tobacco. She reports that she drinks alcohol. She reports that she does not use drugs.  Past Medical History:  Diagnosis Date  . Allergy   . Diabetes mellitus   . H/O renal calculi 10/08/09  . Hyperlipidemia   . Hypertension   . STD (sexually transmitted disease)    HSV I & II, IgG reflex positive    Past Surgical History:  Procedure Laterality Date  . APPENDECTOMY    . CHOLECYSTECTOMY    . TONSILLECTOMY      Current Outpatient Prescriptions  Medication Sig Dispense Refill  . aspirin 81 MG tablet Take 81 mg by mouth daily.      Marland Kitchen glipiZIDE (GLUCOTROL XL) 10 MG 24 hr tablet TAKE 1 TABLET BY MOUTH TWICE A DAY 180 tablet 0  . JANUVIA 100 MG tablet TAKE 1 TABLET BY MOUTH DAILY 90 tablet 1  . [employer] (ONE TOUCH DELICA LANCING DEV) MISC Check blood sugar twice a day and as directed. Dx E11.65 1 each 0  . latanoprost (XALATAN) 0.005 % ophthalmic solution Place 1 drop into both eyes at bedtime.     Marland Kitchen lisinopril (PRINIVIL,ZESTRIL) 20 MG  tablet TAKE 1 TABLET BY MOUTH DAILY 90 tablet 0  . metFORMIN (GLUCOPHAGE-XR) 500 MG 24 hr tablet TAKE TWO (2) TABLETS BY MOUTH 2 TIMES DAILY 360 tablet 0  . ONE TOUCH ULTRA TEST test strip USE TO CHECK BLOOD SUGAR 2 TIMES DAILY AS DIRECTED 100 each 6  . pravastatin (PRAVACHOL) 40 MG tablet TAKE 1 TABLET BY MOUTH DAILY (Patient taking differently: TAKE 40 MG BY MOUTH DAILY) 90 tablet 0  . sertraline (ZOLOFT) 25 MG tablet TAKE 1 TABLET BY MOUTH DAILY 90 tablet 1  . valACYclovir (VALTREX) 500 MG tablet Take 1 tablet (500 mg total) by mouth 2 (two) times daily. Take for 3 days for acute flare 24 tablet 0   No current facility-administered medications for this visit.     Family History  Problem Relation Age of Onset  . Depression Father   . Diabetes Father   . COPD Mother   . Diabetes Mother   . Macular degeneration Mother   . Diabetes Brother   . Cancer Other        liver  . Diabetes Other     ROS:  Pertinent items are noted in HPI.  Otherwise, a comprehensive ROS was  negative.  Exam:   BP 126/70 (BP Location: Right Arm, Patient Position: Sitting, Cuff Size: Normal)   Pulse 80   Resp 16   Ht 5' 6.25" (1.683 m)   Wt 174 lb (78.9 kg)   LMP 02/02/2006   BMI 27.87 kg/m     General appearance: alert, cooperative and appears stated age Head: Normocephalic, without obvious abnormality, atraumatic Neck: no adenopathy, supple, symmetrical, trachea midline and thyroid normal to inspection and palpation Lungs: clear to auscultation bilaterally Breasts: normal appearance, no masses or tenderness, No nipple retraction or dimpling, No nipple discharge or bleeding, No axillary or supraclavicular adenopathy Heart: regular rate and rhythm Abdomen: soft, non-tender; no masses, no organomegaly Extremities: extremities normal, atraumatic, no cyanosis or edema Skin: Skin color, texture, turgor normal. No rashes or lesions Lymph nodes: Cervical, supraclavicular, and axillary nodes normal. No  abnormal inguinal nodes palpated Neurologic: Grossly normal  Pelvic: External genitalia:  no lesions              Urethra:  normal appearing urethra with no masses, tenderness or lesions              Bartholins and Skenes: normal                 Vagina: normal appearing vagina with normal color and discharge, no lesions.  First degree cystocele.              Cervix: no lesions              Pap taken: No. Bimanual Exam:  Uterus:  normal size, contour, position, consistency, mobility, non-tender              Adnexa: no mass, fullness, tenderness              Rectal exam: Yes.  .  Confirms.              Anus:  normal sphincter tone, no lesions  Chaperone was present for exam.  Assessment:   Well woman visit with normal exam. Hx HSV I and II.   DM.  Mild GSI and cystocele.   Plan: Mammogram screening discussed.  She will schedule.  Recommended self breast awareness. Pap and HR HPV as above. Guidelines for Calcium, Vitamin D, regular exercise program including cardiovascular and weight bearing exercise. Kegel's reviewed.  Refill Vatrex.  BMD.  She will schedule when schedules mammogram. Follow up annually and prn.     After visit summary provided.

## 2016-10-27 ENCOUNTER — Other Ambulatory Visit: Payer: Self-pay | Admitting: Family Medicine

## 2016-10-27 ENCOUNTER — Encounter: Payer: Self-pay | Admitting: Obstetrics and Gynecology

## 2016-10-27 ENCOUNTER — Ambulatory Visit (INDEPENDENT_AMBULATORY_CARE_PROVIDER_SITE_OTHER): Payer: BLUE CROSS/BLUE SHIELD | Admitting: Obstetrics and Gynecology

## 2016-10-27 VITALS — BP 126/70 | HR 80 | Resp 16 | Ht 66.25 in | Wt 174.0 lb

## 2016-10-27 DIAGNOSIS — Z780 Asymptomatic menopausal state: Secondary | ICD-10-CM | POA: Diagnosis not present

## 2016-10-27 DIAGNOSIS — Z01419 Encounter for gynecological examination (general) (routine) without abnormal findings: Secondary | ICD-10-CM | POA: Diagnosis not present

## 2016-10-27 MED ORDER — VALACYCLOVIR HCL 500 MG PO TABS: 500.0000 mg | ORAL_TABLET | Freq: Two times a day (BID) | ORAL | 1 refills | Status: DC

## 2016-10-27 NOTE — Patient Instructions (Signed)

## 2016-12-04 ENCOUNTER — Ambulatory Visit: Payer: Self-pay | Admitting: Family Medicine

## 2017-01-06 ENCOUNTER — Other Ambulatory Visit: Payer: Self-pay

## 2017-01-06 ENCOUNTER — Ambulatory Visit: Payer: BLUE CROSS/BLUE SHIELD | Admitting: Certified Nurse Midwife

## 2017-01-06 ENCOUNTER — Encounter: Payer: Self-pay | Admitting: Certified Nurse Midwife

## 2017-01-06 VITALS — BP 120/70 | HR 70 | Resp 16 | Ht 66.25 in | Wt 179.0 lb

## 2017-01-06 DIAGNOSIS — N898 Other specified noninflammatory disorders of vagina: Secondary | ICD-10-CM | POA: Diagnosis not present

## 2017-01-06 DIAGNOSIS — A6009 Herpesviral infection of other urogenital tract: Secondary | ICD-10-CM | POA: Diagnosis not present

## 2017-01-06 MED ORDER — VALACYCLOVIR HCL 500 MG PO TABS: 500.0000 mg | ORAL_TABLET | Freq: Two times a day (BID) | ORAL | 6 refills | Status: DC

## 2017-01-06 NOTE — Patient Instructions (Signed)
Genital Herpes Genital herpes is a common sexually transmitted infection (STI) that is caused by a virus. The virus spreads from person to person through sexual contact. Infection can cause itching, blisters, and sores around the genitals or rectum. Symptoms may last several days and then go away This is called an outbreak. However, the virus remains in your body, so you may have more outbreaks in the future. The time between outbreaks varies and can be months or years. Genital herpes affects men and women. It is particularly concerning for pregnant women because the virus can be passed to the baby during delivery and can cause serious problems. Genital herpes is also a concern for people who have a weak disease-fighting (immune) system. What are the causes? This condition is caused by the herpes simplex virus (HSV) type 1 or type 2. The virus may spread through:  Sexual contact with an infected person, including vaginal, anal, and oral sex.  Contact with fluid from a herpes sore.  The skin. This means that you can get herpes from an infected partner even if he or she does not have a visible sore or does not know that he or she is infected. What increases the risk? You are more likely to develop this condition if:  You have sex with many partners.  You do not use latex condoms during sex. What are the signs or symptoms? Most people do not have symptoms (asymptomatic) or have mild symptoms that may be mistaken for other skin problems. Symptoms may include:  Small red bumps near the genitals, rectum, or mouth. These bumps turn into blisters and then turn into sores.  Flu-like symptoms, including:  Fever.  Body aches.  Swollen lymph nodes.  Headache.  Painful urination.  Pain and itching in the genital area or rectal area.  Vaginal discharge.  Tingling or shooting pain in the legs and buttocks. Generally, symptoms are more severe and last longer during the first (primary)  outbreak. Flu-like symptoms are also more common during the primary outbreak. How is this diagnosed? Genital herpes may be diagnosed based on:  A physical exam.  Your medical history.  Blood tests.  A test of a fluid sample (culture) from an open sore. How is this treated? There is no cure for this condition, but treatment with antiviral medicines that are taken by mouth (orally) can do the following:  Speed up healing and relieve symptoms.  Help to reduce the spread of the virus to sexual partners.  Limit the chance of future outbreaks, or make future outbreaks shorter.  Lessen symptoms of future outbreaks. Your health care provider may also recommend pain relief medicines, such as aspirin or ibuprofen. Follow these instructions at home: Sexual activity   Do not have sexual contact during active outbreaks.  Practice safe sex. Latex condoms and female condoms may help prevent the spread of the herpes virus. General instructions   Keep the affected areas dry and clean.  Take over-the-counter and prescription medicines only as told by your health care provider.  Avoid rubbing or touching blisters and sores. If you do touch blisters or sores:  Wash your hands thoroughly with soap and water.  Do not touch your eyes afterward.  To help relieve pain or itching, you may take the following actions as directed by your health care provider:  Apply a cold, wet cloth (cold compress) to affected areas 4-6 times a day.  Apply a substance that protects your skin and reduces bleeding (astringent).  Apply a   gel that helps relieve pain around sores (lidocaine gel).  Take a warm, shallow bath that cleans the genital area (sitz bath).  Keep all follow-up visits as told by your health care provider. This is important. How is this prevented?  Use condoms. Although anyone can get genital herpes during sexual contact, even with the use of a condom, a condom can provide some  protection.  Avoid having multiple sexual partners.  Talk with your sexual partner about any symptoms either of you may have. Also, talk with your partner about any history of STIs.  Get tested for STIs before you have sex. Ask your partner to do the same.  Do not have sexual contact if you have symptoms of genital herpes. Contact a health care provider if:  Your symptoms are not improving with medicine.  Your symptoms return.  You have new symptoms.  You have a fever.  You have abdominal pain.  You have redness, swelling, or pain in your eye.  You notice new sores on other parts of your body.  You are a woman and experience bleeding between menstrual periods.  You have had herpes and you become pregnant or plan to become pregnant. Summary  Genital herpes is a common sexually transmitted infection (STI) that is caused by the herpes simplex virus (HSV) type 1 or type 2.  These viruses are most often spread through sexual contact with an infected person.  You are more likely to develop this condition if you have sex with many partners or you have unprotected sex.  Most people do not have symptoms (asymptomatic) or have mild symptoms that may be mistaken for other skin problems. Symptoms occur as outbreaks that may happen months or years apart.  There is no cure for this condition, but treatment with oral antiviral medicines can reduce symptoms, reduce the chance of spreading the virus to a partner, prevent future outbreaks, or shorten future outbreaks. This information is not intended to replace advice given to you by your health care provider. Make sure you discuss any questions you have with your health care provider. Document Released: 01/17/2000 Document Revised: 12/20/2015 Document Reviewed: 12/20/2015 Elsevier Interactive Patient Education  2017 [employer]  

## 2017-01-06 NOTE — Progress Notes (Signed)
61 y.o. Married Caucasian female [id number] here with complaint of vaginal symptoms of itching on right side of vagina, burning, and increase discharge. Describes discharge as watery with odor. Has been doing sitz bath with essential oil use with relief.. Onset of symptoms 7 days ago. Denies new personal products or vaginal dryness. Patient treated self with Monistat 3 day suppositories  with some relief, but did not totally clear. She also had a HSV 2 flare about 4 days ago and started on Valtrex to treat, which still feels active.  Urinary symptoms none .Menopausal no HRT. She also has history of HSV 1. Spouse no history of HSV1,2. No other health issues today.  ROS pertinent to HPI  O:Healthy female WDWN Affect: normal, orientation x 3  Exam: Skin: warm and dry Abdomen:non tender  Inguinal Lymph nodes: no enlargement or tenderness Pelvic exam: External genital: normal female with herpetic blisters noted on labia bilateral and mons area, excoriated area in right groin area, tender to touch, with scaling noted, weeping appearance( has been using herbal ointment on) BUS: negative Vagina: watery odorous  discharge noted. , Affirm taken Cervix: normal, non tender, no CMT Uterus: normal, non tender Adnexa:normal, non tender, no masses or fullness noted   A:Normal pelvic exam History of HSV 2 and HSV 1 with recent outbreak after treating with Monistat for yeast. Appearance more characteristic of HSV 1 R/O vaginal infection Menopausal    P:Discussed findings of normal pelvic exam. Discussed HSV presence and characteristics of outbreak still occurring. Patient reviewed her medications and was only taking 500 mg daily of Valtrex. Discussed need to start on 1000 mg daily and continue for 10 days and then drop to 500 mg daily for suppression for the next 1-3 months due to severity of outbreak and to prevent frequent outbreaks. Patient agreeable. "Does not want this again: Stop herbal ointment and switch  to hydrocortisone 1% OTC topically in vulva and groin area bid x 5 days.. Discussed Aveeno sitz bath for comfort. Avoid moist clothes or pads for extended period of time. Wash hands after applying cream to avoid transmission to other areas. Encouraged to start on oral refrigerated probiotic to help with immune status with this outbreak. Questions addressed. Recheck if no change. Rx: Valtrex 1000 mg see order with instructions Lab: Affirm will treat if indicated  Rv prn

## 2017-01-07 ENCOUNTER — Encounter: Payer: Self-pay | Admitting: Certified Nurse Midwife

## 2017-01-08 LAB — VAGINITIS/VAGINOSIS, DNA PROBE
CANDIDA SPECIES: NEGATIVE
GARDNERELLA VAGINALIS: NEGATIVE
TRICHOMONAS VAG: NEGATIVE

## 2017-03-05 ENCOUNTER — Ambulatory Visit: Payer: BLUE CROSS/BLUE SHIELD | Admitting: Family Medicine

## 2017-03-05 ENCOUNTER — Encounter: Payer: Self-pay | Admitting: Family Medicine

## 2017-03-05 ENCOUNTER — Other Ambulatory Visit: Payer: Self-pay

## 2017-03-05 VITALS — BP 161/88 | HR 80 | Temp 98.5°F | Ht 66.0 in | Wt 177.2 lb

## 2017-03-05 DIAGNOSIS — E1165 Type 2 diabetes mellitus with hyperglycemia: Secondary | ICD-10-CM | POA: Diagnosis not present

## 2017-03-05 DIAGNOSIS — R5383 Other fatigue: Secondary | ICD-10-CM | POA: Diagnosis not present

## 2017-03-05 DIAGNOSIS — M19041 Primary osteoarthritis, right hand: Secondary | ICD-10-CM

## 2017-03-05 DIAGNOSIS — I10 Essential (primary) hypertension: Secondary | ICD-10-CM | POA: Diagnosis not present

## 2017-03-05 DIAGNOSIS — E785 Hyperlipidemia, unspecified: Secondary | ICD-10-CM | POA: Diagnosis not present

## 2017-03-05 DIAGNOSIS — M19049 Primary osteoarthritis, unspecified hand: Secondary | ICD-10-CM | POA: Insufficient documentation

## 2017-03-05 LAB — HM DIABETES FOOT EXAM

## 2017-03-05 MED ORDER — SERTRALINE HCL 25 MG PO TABS: 25.0000 mg | ORAL_TABLET | Freq: Every day | ORAL | 1 refills | Status: DC

## 2017-03-05 MED ORDER — DICLOFENAC SODIUM 75 MG PO TBEC
75.0000 mg | DELAYED_RELEASE_TABLET | Freq: Two times a day (BID) | ORAL | 0 refills | Status: DC
Start: 2017-03-05 — End: 2017-07-27

## 2017-03-05 MED ORDER — LISINOPRIL 20 MG PO TABS: 20.0000 mg | ORAL_TABLET | Freq: Every day | ORAL | 1 refills | Status: DC

## 2017-03-05 MED ORDER — PRAVASTATIN SODIUM 40 MG PO TABS: ORAL_TABLET | ORAL | 1 refills | Status: DC

## 2017-03-05 NOTE — Assessment & Plan Note (Addendum)
Due for re-eval. Encouraged exercise, weight loss, healthy eating habits.

## 2017-03-05 NOTE — Assessment & Plan Note (Signed)
Treat with NSAIDs x 2 weeks. If not improving consider X-ray and posoble referral.  No fall.

## 2017-03-05 NOTE — Progress Notes (Signed)
Subjective:    Patient ID: Jean Robinson, female    DOB: [birth year], 62 y.o.   MRN: [id number]  HPI   62 year old female female presents for follow up, due for DM labs.  She is also having  right middle  Finger pain x months  Hx of trigger finger. Cannot bend all the wat. No redness but mild swelling. Using advil 2-3 times a day.  Diabetes:  Due for re-eval. Open to starting insulin if A1C up.  She is on glipizide, metformin.. Not taking januvia in last several month. Using medications without difficulties: Hypoglycemic episodes: none Hyperglycemic episodes:yes Feet problems: none Blood Sugars averaging: At home lately monitor has not been working correctly.  FBS 120-300 eye exam within last year: retinopathy 04/2016  BP has been running nml at home 120/70-.. She took her lisinopril this AM. Has felt stress and overwhelmed at work at times.  Blood pressure (!) 161/88, pulse 80, temperature 98.5 F (36.9 C), temperature source Oral, height 5\' 6"  (1.676 m), weight 177 lb 4 oz (80.4 kg), last menstrual period 02/02/2006.   Review of Systems  Constitutional: Negative for fatigue and fever.  HENT: Negative for ear pain.   Eyes: Negative for pain.  Respiratory: Negative for chest tightness and shortness of breath.   Cardiovascular: Negative for chest pain, palpitations and leg swelling.  Gastrointestinal: Negative for abdominal pain.  Genitourinary: Negative for dysuria.       Objective:   Physical Exam  Constitutional: Vital signs are normal. She appears well-developed and well-nourished. She is cooperative.  Non-toxic appearance. She does not appear ill. No distress.  HENT:  Head: Normocephalic.  Right Ear: Hearing, tympanic membrane, external ear and ear canal normal. Tympanic membrane is not erythematous, not retracted and not bulging.  Left Ear: Hearing, tympanic membrane, external ear and ear canal normal. Tympanic membrane is not erythematous, not retracted and not bulging.    Nose: No mucosal edema or rhinorrhea. Right sinus exhibits no maxillary sinus tenderness and no frontal sinus tenderness. Left sinus exhibits no maxillary sinus tenderness and no frontal sinus tenderness.  Mouth/Throat: Uvula is midline, oropharynx is clear and moist and mucous membranes are normal.  Eyes: Conjunctivae, EOM and lids are normal. Pupils are equal, round, and reactive to light. Lids are everted and swept, no foreign bodies found.  Neck: Trachea normal and normal range of motion. Neck supple. Carotid bruit is not present. No thyroid mass and no thyromegaly present.  Cardiovascular: Normal rate, regular rhythm, S1 normal, S2 normal, normal heart sounds, intact distal pulses and normal pulses. Exam reveals no gallop and no friction rub.  No murmur heard. Pulmonary/Chest: Effort normal and breath sounds normal. No tachypnea. No respiratory distress. She has no decreased breath sounds. She has no wheezes. She has no rhonchi. She has no rales.  Abdominal: Soft. Normal appearance and bowel sounds are normal. There is no tenderness.  Musculoskeletal:  No redness, but swelling and soreness in right middle finger at PIP joint.  Neurological: She is alert.  Skin: Skin is warm, dry and intact. No rash noted.  Psychiatric: Her speech is normal and behavior is normal. Judgment and thought content normal. Her mood appears not anxious. Cognition and memory are normal. She does not exhibit a depressed mood.      Diabetic foot exam: Normal inspection No skin breakdown No calluses  Normal DP pulses Normal sensation to light touch and monofilament Nails normal     Assessment & Plan:

## 2017-03-05 NOTE — Assessment & Plan Note (Signed)
Due for re-eval on pravastatin 

## 2017-03-05 NOTE — Assessment & Plan Note (Signed)
Usually well controlled. Will follow at home.

## 2017-03-05 NOTE — Patient Instructions (Addendum)
Follow BP at home.. Goal < 140/90.  Mychart BPs after 1-2 weeks.  Call if stress and anxiety  continueing to worsen.  Call to schedule lab only appt when you can.  Look into coverage of long acting insulin., Lantus, Tuojeo, levemir, balsalgar etc.  Stop the ibuprofen. Start diclofenac twice daily for arthritis x 2 weeks.

## 2017-03-08 ENCOUNTER — Ambulatory Visit: Payer: BLUE CROSS/BLUE SHIELD | Admitting: Family Medicine

## 2017-03-08 ENCOUNTER — Encounter: Payer: Self-pay | Admitting: Family Medicine

## 2017-03-08 ENCOUNTER — Other Ambulatory Visit (INDEPENDENT_AMBULATORY_CARE_PROVIDER_SITE_OTHER): Payer: BLUE CROSS/BLUE SHIELD

## 2017-03-08 VITALS — BP 142/82 | HR 109 | Wt 174.1 lb

## 2017-03-08 DIAGNOSIS — E1165 Type 2 diabetes mellitus with hyperglycemia: Secondary | ICD-10-CM

## 2017-03-08 DIAGNOSIS — E785 Hyperlipidemia, unspecified: Secondary | ICD-10-CM | POA: Diagnosis not present

## 2017-03-08 DIAGNOSIS — R5383 Other fatigue: Secondary | ICD-10-CM

## 2017-03-08 DIAGNOSIS — I10 Essential (primary) hypertension: Secondary | ICD-10-CM

## 2017-03-08 LAB — COMPREHENSIVE METABOLIC PANEL
ALBUMIN: 4.2 g/dL (ref 3.5–5.2)
ALK PHOS: 77 U/L (ref 39–117)
ALT: 24 U/L (ref 0–35)
AST: 19 U/L (ref 0–37)
BILIRUBIN TOTAL: 0.8 mg/dL (ref 0.2–1.2)
BUN: 19 mg/dL (ref 6–23)
CALCIUM: 10.3 mg/dL (ref 8.4–10.5)
CO2: 26 mEq/L (ref 19–32)
CREATININE: 0.84 mg/dL (ref 0.40–1.20)
Chloride: 99 mEq/L (ref 96–112)
GFR: 72.65 mL/min (ref >60.00)
Glucose, Bld: 270 mg/dL — ABNORMAL HIGH (ref 70–99)
Potassium: 4.4 mEq/L (ref 3.5–5.1)
Sodium: 134 mEq/L — ABNORMAL LOW (ref 135–145)
TOTAL PROTEIN: 7.2 g/dL (ref 6.0–8.3)

## 2017-03-08 LAB — LIPID PANEL
CHOLESTEROL: 140 mg/dL (ref 0–200)
HDL: 54.8 mg/dL (ref >39.00)
LDL Cholesterol: 62 mg/dL (ref 0–99)
NonHDL: 85.38
TRIGLYCERIDES: 119 mg/dL (ref 0.0–149.0)
Total CHOL/HDL Ratio: 3
VLDL: 23.8 mg/dL (ref 0.0–40.0)

## 2017-03-08 LAB — T4, FREE: Free T4: 0.99 ng/dL (ref 0.60–1.60)

## 2017-03-08 LAB — T3, FREE: T3, Free: 3.1 pg/mL (ref 2.3–4.2)

## 2017-03-08 LAB — TSH: TSH: 2.27 u[IU]/mL (ref 0.35–4.50)

## 2017-03-08 LAB — HEMOGLOBIN A1C: Hgb A1c MFr Bld: 11.1 % — ABNORMAL HIGH (ref 4.6–6.5)

## 2017-03-08 MED ORDER — LISINOPRIL 40 MG PO TABS: 40.0000 mg | ORAL_TABLET | Freq: Every day | ORAL | 3 refills | Status: DC

## 2017-03-08 NOTE — Progress Notes (Signed)
   Subjective:    Patient ID: Jean Robinson, female    DOB: 02/23/1955, 62 y.o.   MRN: [id number]  HPI This is a 62 yo female who presents today with elevated blood pressure readings. Was seen 3 days ago and was told to check her blood pressures x 2 weeks.  Took multiple time throughout the day two days ago with reading 145-182/82-109. Took an extra lisinopril 20 mg last night. She denies chest pain, SOB, has had a mild headache that she thinks may be related to stress. Also, stopped caffeine cold [place] over the weekend. Typically has 2 cups in the morning and some iced tea in the afternoon. Trying to watch salt intake.  Having labs this morning.  Works at [employer] and finds this intermittently stressful. Also helps with grandchildren after school.   Past Medical History:  Diagnosis Date  . Allergy   . Diabetes mellitus   . H/O renal calculi 10/08/09  . Hyperlipidemia   . Hypertension   . STD (sexually transmitted disease)    HSV I & II, IgG reflex positive   Past Surgical History:  Procedure Laterality Date  . APPENDECTOMY    . CHOLECYSTECTOMY    . TONSILLECTOMY     Family History  Problem Relation Age of Onset  . Depression Father   . Diabetes Father   . COPD Mother   . Diabetes Mother   . Macular degeneration Mother   . Diabetes Brother   . Cancer Other        liver  . Diabetes Other    Social History   Tobacco Use  . Smoking status: Never Smoker  . Smokeless tobacco: Never Used  Substance Use Topics  . Alcohol use: Yes    Alcohol/week: 0.0 - 1.2 oz  . Drug use: No      Review of Systems Per HPI    Objective:   Physical Exam  Constitutional: She is oriented to person, place, and time. She appears well-developed and well-nourished.  HENT:  Head: Normocephalic and atraumatic.  Eyes: Conjunctivae are normal.  Cardiovascular: Normal rate.  Pulmonary/Chest: Effort normal.  Neurological: She is alert and oriented to person, place, and time.  Skin: Skin is warm  and dry.  Psychiatric: She has a normal mood and affect. Her behavior is normal. Judgment and thought content normal.  Vitals reviewed.     BP (!) 160/80 (BP Location: Left Arm, Patient Position: Sitting, Cuff Size: Normal)   Pulse (!) 109   Wt 174 lb 1.9 oz (79 kg)   LMP 02/02/2006   BMI 28.10 kg/m  BP Readings from Last 3 Encounters:  03/08/17 (!) 142/82  03/05/17 (!) 161/88  01/06/17 120/70       Assessment & Plan:  1. Essential hypertension - discussed checking blood pressure no more than 1/x day, provided written instructions for checking blood pressure along with DASH diet  - RTC precautions reviewed - She will continue to check blood pressure and keep log and report to Dr. Diona Browner in 2 weeks.  - lisinopril (PRINIVIL,ZESTRIL) 40 MG tablet; Take 1 tablet (40 mg total) by mouth daily.  Dispense: 90 tablet; Refill: Mather, FNP-BC  [facility], [facility]  03/08/2017 9:01 AM

## 2017-03-08 NOTE — Patient Instructions (Signed)
Please take your blood pressure once a day at different times    DASH Eating Plan DASH stands for "Dietary Approaches to Stop Hypertension." The DASH eating plan is a healthy eating plan that has been shown to reduce high blood pressure (hypertension). It may also reduce your risk for type 2 diabetes, heart disease, and stroke. The DASH eating plan may also help with weight loss. What are tips for following this plan? General guidelines  Avoid eating more than 2,300 mg (milligrams) of salt (sodium) a day. If you have hypertension, you may need to reduce your sodium intake to 1,500 mg a day.  Limit alcohol intake to no more than 1 drink a day for nonpregnant women and 2 drinks a day for men. One drink equals 12 oz of beer, 5 oz of wine, or 1 oz of hard liquor.  Work with your health care provider to maintain a healthy body weight or to lose weight. Ask what an ideal weight is for you.  Get at least 30 minutes of exercise that causes your heart to beat faster (aerobic exercise) most days of the week. Activities may include walking, swimming, or biking.  Work with your health care provider or diet and nutrition specialist (dietitian) to adjust your eating plan to your individual calorie needs. Reading food labels  Check food labels for the amount of sodium per serving. Choose foods with less than 5 percent of the Daily Value of sodium. Generally, foods with less than 300 mg of sodium per serving fit into this eating plan.  To find whole grains, look for the word "whole" as the first word in the ingredient list. Shopping  Buy products labeled as "low-sodium" or "no salt added."  Buy fresh foods. Avoid canned foods and premade or frozen meals. Cooking  Avoid adding salt when cooking. Use salt-free seasonings or herbs instead of table salt or sea salt. Check with your health care provider or pharmacist before using salt substitutes.  Do not fry foods. Cook foods using healthy methods such  as baking, boiling, grilling, and broiling instead.  Cook with heart-healthy oils, such as olive, canola, soybean, or sunflower oil. Meal planning   Eat a balanced diet that includes: ? 5 or more servings of fruits and vegetables each day. At each meal, try to fill half of your plate with fruits and vegetables. ? Up to 6-8 servings of whole grains each day. ? Less than 6 oz of lean meat, poultry, or fish each day. A 3-oz serving of meat is about the same size as a deck of cards. One egg equals 1 oz. ? 2 servings of low-fat dairy each day. ? A serving of nuts, seeds, or beans 5 times each week. ? Heart-healthy fats. Healthy fats called Omega-3 fatty acids are found in foods such as flaxseeds and coldwater fish, like sardines, salmon, and mackerel.  Limit how much you eat of the following: ? Canned or prepackaged foods. ? Food that is high in trans fat, such as fried foods. ? Food that is high in saturated fat, such as fatty meat. ? Sweets, desserts, sugary drinks, and other foods with added sugar. ? Full-fat dairy products.  Do not salt foods before eating.  Try to eat at least 2 vegetarian meals each week.  Eat more home-cooked food and less restaurant, buffet, and fast food.  When eating at a restaurant, ask that your food be prepared with less salt or no salt, if possible. What foods are recommended? The  items listed may not be a complete list. Talk with your dietitian about what dietary choices are best for you. Grains Whole-grain or whole-wheat bread. Whole-grain or whole-wheat pasta. Brown rice. [place]. Bulgur. Whole-grain and low-sodium cereals. Pita bread. Low-fat, low-sodium crackers. Whole-wheat flour tortillas. Vegetables Fresh or frozen vegetables (raw, steamed, roasted, or grilled). Low-sodium or reduced-sodium tomato and vegetable juice. Low-sodium or reduced-sodium tomato sauce and tomato paste. Low-sodium or reduced-sodium canned vegetables. Fruits All  fresh, dried, or frozen fruit. Canned fruit in natural juice (without added sugar). Meat and other protein foods Skinless chicken or [place]. Ground chicken or [place]. Pork with fat trimmed off. Fish and seafood. Egg whites. Dried beans, peas, or lentils. Unsalted nuts, nut butters, and seeds. Unsalted canned beans. Lean cuts of beef with fat trimmed off. Low-sodium, lean deli meat. Dairy Low-fat (1%) or fat-free (skim) milk. Fat-free, low-fat, or reduced-fat cheeses. Nonfat, low-sodium ricotta or cottage cheese. Low-fat or nonfat yogurt. Low-fat, low-sodium cheese. Fats and oils Soft margarine without trans fats. Vegetable oil. Low-fat, reduced-fat, or light mayonnaise and salad dressings (reduced-sodium). Canola, safflower, olive, soybean, and sunflower oils. Avocado. Seasoning and other foods Herbs. Spices. Seasoning mixes without salt. Unsalted popcorn and pretzels. Fat-free sweets. What foods are not recommended? The items listed may not be a complete list. Talk with your dietitian about what dietary choices are best for you. Grains Baked goods made with fat, such as croissants, muffins, or some breads. Dry pasta or rice meal packs. Vegetables Creamed or fried vegetables. Vegetables in a cheese sauce. Regular canned vegetables (not low-sodium or reduced-sodium). Regular canned tomato sauce and paste (not low-sodium or reduced-sodium). Regular tomato and vegetable juice (not low-sodium or reduced-sodium). Angie Fava. Olives. Fruits Canned fruit in a light or heavy syrup. Fried fruit. Fruit in cream or butter sauce. Meat and other protein foods Fatty cuts of meat. Ribs. Fried meat. Berniece Salines. Sausage. Bologna and other processed lunch meats. Salami. Fatback. Hotdogs. Bratwurst. Salted nuts and seeds. Canned beans with added salt. Canned or smoked fish. Whole eggs or egg yolks. Chicken or [place] with skin. Dairy Whole or 2% milk, cream, and half-and-half. Whole or full-fat cream cheese. Whole-fat or  sweetened yogurt. Full-fat cheese. Nondairy creamers. Whipped toppings. Processed cheese and cheese spreads. Fats and oils Butter. Stick margarine. Lard. Shortening. Ghee. Bacon fat. Tropical oils, such as coconut, palm kernel, or palm oil. Seasoning and other foods Salted popcorn and pretzels. Onion salt, garlic salt, seasoned salt, table salt, and sea salt. Worcestershire sauce. Tartar sauce. Barbecue sauce. Teriyaki sauce. Soy sauce, including reduced-sodium. Steak sauce. Canned and packaged gravies. Fish sauce. Oyster sauce. Cocktail sauce. Horseradish that you find on the shelf. Ketchup. Mustard. Meat flavorings and tenderizers. Bouillon cubes. Hot sauce and Tabasco sauce. Premade or packaged marinades. Premade or packaged taco seasonings. Relishes. Regular salad dressings. Where to find more information:  National Heart, Lung, and [place]: https://wilson-eaton.com/  American Heart Association: www.heart.org Summary  The DASH eating plan is a healthy eating plan that has been shown to reduce high blood pressure (hypertension). It may also reduce your risk for type 2 diabetes, heart disease, and stroke.  With the DASH eating plan, you should limit salt (sodium) intake to 2,300 mg a day. If you have hypertension, you may need to reduce your sodium intake to 1,500 mg a day.  When on the DASH eating plan, aim to eat more fresh fruits and vegetables, whole grains, lean proteins, low-fat dairy, and heart-healthy fats.  Work with your health care provider  or diet and nutrition specialist (dietitian) to adjust your eating plan to your individual calorie needs. This information is not intended to replace advice given to you by your health care provider. Make sure you discuss any questions you have with your health care provider. Document Released: 01/08/2011 Document Revised: 01/13/2016 Document Reviewed: 01/13/2016 Elsevier Interactive Patient Education  2018 [employer].  How to Take Your  Blood Pressure You can take your blood pressure at home with a machine. You may need to check your blood pressure at home:  To check if you have high blood pressure (hypertension).  To check your blood pressure over time.  To make sure your blood pressure medicine is working.  Supplies needed: You will need a blood pressure machine, or monitor. You can buy one at a drugstore or online. When choosing one:  Choose one with an arm cuff.  Choose one that wraps around your upper arm. Only one finger should fit between your arm and the cuff.  Do not choose one that measures your blood pressure from your wrist or finger.  Your doctor can suggest a monitor. How to prepare Avoid these things for 30 minutes before checking your blood pressure:  Drinking caffeine.  Drinking alcohol.  Eating.  Smoking.  Exercising.  Five minutes before checking your blood pressure:  Pee.  Sit in a dining chair. Avoid sitting in a soft couch or armchair.  Be quiet. Do not talk.  How to take your blood pressure Follow the instructions that came with your machine. If you have a digital blood pressure monitor, these may be the instructions: 1. Sit up straight. 2. Place your feet on the floor. Do not cross your ankles or legs. 3. Rest your left arm at the level of your heart. You may rest it on a table, desk, or chair. 4. Pull up your shirt sleeve. 5. Wrap the blood pressure cuff around the upper part of your left arm. The cuff should be 1 inch (2.5 cm) above your elbow. It is best to wrap the cuff around bare skin. 6. Fit the cuff snugly around your arm. You should be able to place only one finger between the cuff and your arm. 7. Put the cord inside the groove of your elbow. 8. Press the power button. 9. Sit quietly while the cuff fills with air and loses air. 10. Write down the numbers on the screen. 11. Wait 2-3 minutes and then repeat steps 1-10.  What do the numbers mean? Two numbers make  up your blood pressure. The first number is called systolic pressure. The second is called diastolic pressure. An example of a blood pressure reading is "120 over 80" (or 120/80). If you are an adult and do not have a medical condition, use this guide to find out if your blood pressure is normal: Normal  First number: below 120.  Second number: below 80. Elevated  First number: 120-129.  Second number: below 80. Hypertension stage 1  First number: 130-139.  Second number: 80-89. Hypertension stage 2  First number: 140 or above.  Second number: 59 or above. Your blood pressure is above normal even if only the top or bottom number is above normal. Follow these instructions at home:  Check your blood pressure as often as your doctor tells you to.  Take your monitor to your next doctor's appointment. Your doctor will: ? Make sure you are using it correctly. ? Make sure it is working right.  Make sure you  understand what your blood pressure numbers should be.  Tell your doctor if your medicines are causing side effects. Contact a doctor if:  Your blood pressure keeps being high. Get help right away if:  Your first blood pressure number is higher than 180.  Your second blood pressure number is higher than 120. This information is not intended to replace advice given to you by your health care provider. Make sure you discuss any questions you have with your health care provider. Document Released: 01/02/2008 Document Revised: 12/18/2015 Document Reviewed: 06/28/2015 Elsevier Interactive Patient Education  [employer].

## 2017-03-15 ENCOUNTER — Telehealth: Payer: Self-pay | Admitting: *Deleted

## 2017-03-15 NOTE — Telephone Encounter (Signed)
Copied from [place]. Topic: General - Other >> Mar 15, 2017 10:43 AM Yvette Rack wrote: Reason for CRM: patient calling stating that she was supposed to have gotten a RX for insulin called into her pharmacy patient also states that her pharmacy will approve the Levemir [facility] to [id number] [place], [place] [phone] (Phone) [phone] (Fax)

## 2017-03-16 MED ORDER — INSULIN DETEMIR 100 UNIT/ML FLEXPEN: 20.0000 [IU] | PEN_INJECTOR | Freq: Every day | SUBCUTANEOUS | 11 refills | Status: DC

## 2017-03-16 NOTE — Telephone Encounter (Signed)
Call pt Levemir flex pen sent in. Will start her at 20 units daily. She should continue metformin and stop glipizide and [place].  In future we will likely need to increase to at least 40 units daily as calculated per her weight.  Have her make appt in 2 weeks for DM follow up new insulin start..have her bring in fasting CBGs and  one check  hours after a meal daily Will make med adjustments at that time.

## 2017-03-16 NOTE — Telephone Encounter (Signed)
Ms. Ocampo notified as instructed by telephone.   She is waiting on a new glucose meter so she will wait to start her Levemir when she is able to check her blood sugars..  The meter should be arriving any day now.  Once she has started the insulin she will call back to schedule a 2 weeks follow up with Dr. Diona Browner.

## 2017-03-22 ENCOUNTER — Other Ambulatory Visit: Payer: Self-pay | Admitting: *Deleted

## 2017-03-22 MED ORDER — PEN NEEDLES 31G X 8 MM MISC: 3 refills | Status: DC

## 2017-04-01 LAB — HM DIABETES EYE EXAM

## 2017-04-08 ENCOUNTER — Encounter: Payer: Self-pay | Admitting: Family Medicine

## 2017-04-13 ENCOUNTER — Telehealth: Payer: Self-pay | Admitting: Family Medicine

## 2017-04-13 NOTE — Telephone Encounter (Signed)
Jean Robinson notified to continue her Lisinopril at 40 mg daily for now.  Increase her Levemir to 30 units qhs.  I ask her to call her insurance for other cheaper options like Lantus or Balsalgar because a longer acting insulin would be a better options.  She will call her insurance and let us know what she finds out.  Medication list updated.

## 2017-04-13 NOTE — Telephone Encounter (Signed)
Readings placed in Dr. Rometta Emery in box to review.

## 2017-04-13 NOTE — Telephone Encounter (Signed)
Pt stated that she tried to send her BP readings through MyChart but it wouldn't send. She dropped off a paper copy. Placed on cart.

## 2017-04-13 NOTE — Telephone Encounter (Signed)
See note on patient's note in Jean Robinson's in box.  Increase levemir to 30 Units, continue other meds and have her look into insurance coverage of long acting insulins for better option.

## 2017-04-30 ENCOUNTER — Ambulatory Visit: Payer: BLUE CROSS/BLUE SHIELD | Admitting: Family Medicine

## 2017-04-30 ENCOUNTER — Encounter: Payer: Self-pay | Admitting: Family Medicine

## 2017-04-30 ENCOUNTER — Other Ambulatory Visit: Payer: Self-pay

## 2017-04-30 VITALS — BP 122/72 | HR 76 | Temp 97.8°F | Ht 61.0 in | Wt 176.2 lb

## 2017-04-30 DIAGNOSIS — I10 Essential (primary) hypertension: Secondary | ICD-10-CM

## 2017-04-30 DIAGNOSIS — E1165 Type 2 diabetes mellitus with hyperglycemia: Secondary | ICD-10-CM

## 2017-04-30 MED ORDER — INSULIN DETEMIR 100 UNIT/ML FLEXPEN: 45.0000 [IU] | PEN_INJECTOR | Freq: Every day | SUBCUTANEOUS | 11 refills | Status: DC

## 2017-04-30 MED ORDER — METFORMIN HCL ER 500 MG PO TB24: ORAL_TABLET | ORAL | 3 refills | Status: DC

## 2017-04-30 NOTE — Patient Instructions (Addendum)
Increase Levemir to  45 Units daily.  Continue metformin.  Keep up with the healthy lifestyle .  If fasting blood sugar > 120 for 2 or 3 days in row you will increase insulin by 2 units.  Follow BP at home.. Goal < 140/90.

## 2017-04-30 NOTE — Assessment & Plan Note (Signed)
Increase Levemir to  45 Units daily.  Continue metformin.  Keep up with the healthy lifestyle .  If fasting blood sugar > 120 for 2 or 3 days in row you will increase insulin by 2 units.

## 2017-04-30 NOTE — Assessment & Plan Note (Signed)
Follow at home.. We may need to add HCTZ to the max of lisinopril.

## 2017-04-30 NOTE — Progress Notes (Signed)
   Subjective:    Patient ID: Jean Robinson, female    DOB: Sep 10, 1955, 62 y.o.   MRN: [id number]  HPI 62 year old female presents for follow up HTN and DM She reports accidental fall.. Knocked over by dog  Weeks ago.Marland Kitchen Has been dealing with right chest wall pain. TTP on right ribcage. NO SOB. No issue with breathing, but pain with sneeze, cough.  Diabetes:  On metformin and new start insulin. Stopped glipizide and Januvia.  She has been working really hard on diet, low cholesterol and low carb. 30 Units Levemir Using medications without difficulties: none Hypoglycemic episodes:none Hyperglycemic episodes: yes Feet problems:none Blood Sugars averaging: fbs 200, 2 hour post prandial  > 200-300 eye exam within last year: yes  Hypertension:   Slight improvement but remains above goal despite increase in lisinopril to 40 mg. BP Readings from Last 3 Encounters:  04/30/17 (!) 158/80  03/08/17 (!) 142/82  03/05/17 (!) 161/88  Using medication without problems or lightheadedness:  none Chest pain with exertion:none Edema:none Short of breath:none Average home BPs:  128/76-139/76 Other issues:   Review of Systems  Constitutional: Negative for fatigue and fever.  HENT: Negative for congestion.   Eyes: Negative for pain.  Respiratory: Negative for cough and shortness of breath.   Cardiovascular: Negative for chest pain, palpitations and leg swelling.  Gastrointestinal: Negative for abdominal pain.  Genitourinary: Negative for dysuria and vaginal bleeding.  Musculoskeletal: Negative for back pain.  Neurological: Negative for syncope, light-headedness and headaches.  Psychiatric/Behavioral: Negative for dysphoric mood.       Objective:   Physical Exam  Constitutional: Vital signs are normal. She appears well-developed and well-nourished. She is cooperative.  Non-toxic appearance. She does not appear ill. No distress.  HENT:  Head: Normocephalic.  Right Ear: Hearing, tympanic membrane,  external ear and ear canal normal. Tympanic membrane is not erythematous, not retracted and not bulging.  Left Ear: Hearing, tympanic membrane, external ear and ear canal normal. Tympanic membrane is not erythematous, not retracted and not bulging.  Nose: No mucosal edema or rhinorrhea. Right sinus exhibits no maxillary sinus tenderness and no frontal sinus tenderness. Left sinus exhibits no maxillary sinus tenderness and no frontal sinus tenderness.  Mouth/Throat: Uvula is midline, oropharynx is clear and moist and mucous membranes are normal.  Eyes: Pupils are equal, round, and reactive to light. Conjunctivae, EOM and lids are normal. Lids are everted and swept, no foreign bodies found.  Neck: Trachea normal and normal range of motion. Neck supple. Carotid bruit is not present. No thyroid mass and no thyromegaly present.  Cardiovascular: Normal rate, regular rhythm, S1 normal, S2 normal, normal heart sounds, intact distal pulses and normal pulses. Exam reveals no gallop and no friction rub.  No murmur heard. Pulmonary/Chest: Effort normal and breath sounds normal. No tachypnea. No respiratory distress. She has no decreased breath sounds. She has no wheezes. She has no rhonchi. She has no rales.  Abdominal: Soft. Normal appearance and bowel sounds are normal. There is no tenderness.  Neurological: She is alert.  Skin: Skin is warm, dry and intact. No rash noted.  Psychiatric: Her speech is normal and behavior is normal. Judgment and thought content normal. Her mood appears not anxious. Cognition and memory are normal. She does not exhibit a depressed mood.          Assessment & Plan:

## 2017-04-30 NOTE — Addendum Note (Signed)
Addended by: Carter Kitten on: 04/30/2017 10:07 AM   Modules accepted: Orders

## 2017-05-03 ENCOUNTER — Other Ambulatory Visit: Payer: Self-pay | Admitting: *Deleted

## 2017-05-03 ENCOUNTER — Telehealth: Payer: Self-pay | Admitting: Family Medicine

## 2017-05-03 MED ORDER — INSULIN DETEMIR 100 UNIT/ML FLEXPEN: 45.0000 [IU] | PEN_INJECTOR | Freq: Every day | SUBCUTANEOUS | 11 refills | Status: DC

## 2017-05-03 NOTE — Telephone Encounter (Signed)
[facility] stating patient is being charged 2 co-pays for her Levemir and is requesting updated Rx to indicate titration for 1 box (15 ml) to last 30 days.  [facility] at [phone].

## 2017-05-03 NOTE — Telephone Encounter (Signed)
Pt. Reports her pharmacist will reach out to Dr. Diona Browner about her Levemir and how it is refilled for optimal insurance reimbursement.

## 2017-05-03 NOTE — Telephone Encounter (Signed)
Copied from [place] [phone]. Topic: Quick Communication - See Telephone Encounter >> May 03, 2017 11:12 AM Hewitt Shorts wrote: CRM for notification. See Telephone encounter for: 05/03/17.pt needs to talk with someone regarding the rx of of her levemir and how to get a month supply and only paying one copay  Best number [phone]

## 2017-05-31 ENCOUNTER — Other Ambulatory Visit: Payer: Self-pay | Admitting: *Deleted

## 2017-05-31 MED ORDER — GLUCOSE BLOOD VI STRP: ORAL_STRIP | 3 refills | Status: DC

## 2017-06-08 ENCOUNTER — Ambulatory Visit (INDEPENDENT_AMBULATORY_CARE_PROVIDER_SITE_OTHER): Payer: BLUE CROSS/BLUE SHIELD

## 2017-06-08 ENCOUNTER — Encounter: Payer: Self-pay | Admitting: Podiatry

## 2017-06-08 ENCOUNTER — Ambulatory Visit: Payer: BLUE CROSS/BLUE SHIELD | Admitting: Podiatry

## 2017-06-08 ENCOUNTER — Ambulatory Visit: Payer: BLUE CROSS/BLUE SHIELD | Admitting: Family Medicine

## 2017-06-08 DIAGNOSIS — M779 Enthesopathy, unspecified: Secondary | ICD-10-CM

## 2017-06-08 DIAGNOSIS — M722 Plantar fascial fibromatosis: Secondary | ICD-10-CM | POA: Diagnosis not present

## 2017-06-08 MED ORDER — DICLOFENAC SODIUM 1 % TD GEL: 2.0000 g | Freq: Four times a day (QID) | TRANSDERMAL | 2 refills | Status: DC

## 2017-06-08 NOTE — Progress Notes (Signed)
Subjective:    Patient ID: Jean Robinson, female    DOB: October 13, 1955, 62 y.o.   MRN: [id number]  HPI 62 year old female presents the office today for concerns of left heel pain as well as pain to the ball of the right foot.  She states that her left heel is painful during the day when she works on her feet.  She works at [employer].  She denies any recent injury or trauma.  She is also been having some pain in the ball of her right foot is been ongoing for about 6 weeks as well.  He states that it hurts when she walks and stands.  She has been taking ibuprofen for both of these issues.  She also had a deep tissue massage which helped and she also used hot water therapy and this also significantly helped her pain.  She denies any redness to her feet and only occasional swelling.  She has no other concerns.  Last A1c was 11.1  Review of Systems  All other systems reviewed and are negative.  Past Medical History:  Diagnosis Date  . Allergy   . Diabetes mellitus   . H/O renal calculi 10/08/09  . Hyperlipidemia   . Hypertension   . STD (sexually transmitted disease)    HSV I & II, IgG reflex positive    Past Surgical History:  Procedure Laterality Date  . APPENDECTOMY    . CHOLECYSTECTOMY    . TONSILLECTOMY       Current Outpatient Medications:  .  aspirin 81 MG tablet, Take 81 mg by mouth daily.  , Disp: , Rfl:  .  diclofenac (VOLTAREN) 75 MG EC tablet, Take 1 tablet (75 mg total) by mouth 2 (two) times daily., Disp: 30 tablet, Rfl: 0 .  diclofenac sodium (VOLTAREN) 1 % GEL, Apply 2 g topically 4 (four) times daily. Rub into affected area of foot 2 to 4 times daily, Disp: 100 g, Rfl: 2 .  glucose blood (ONETOUCH VERIO) test strip, Use to check blood sugar twice daily.  Dx: E11.65, Disp: 200 each, Rfl: 3 .  Insulin Detemir (LEVEMIR FLEXTOUCH) 100 UNIT/ML Pen, Inject 45 Units into the skin daily at 10 pm. If fasting blood sugar > 120 for 2 or 3 days in row,  increase insulin by 2 units.  15  ml = 30 day supply, Disp: 15 pen, Rfl: 11 .  Insulin Pen Needle (PEN NEEDLES) 31G X 8 MM MISC, Use to inject Levemir daily, Disp: 90 each, Rfl: 3 .  Lancet Devices (ONE TOUCH DELICA LANCING DEV) MISC, Check blood sugar twice a day and as directed. Dx E11.65, Disp: 1 each, Rfl: 0 .  latanoprost (XALATAN) 0.005 % ophthalmic solution, Place 1 drop into both eyes at bedtime. , Disp: , Rfl:  .  lisinopril (PRINIVIL,ZESTRIL) 40 MG tablet, Take 1 tablet (40 mg total) by mouth daily., Disp: 90 tablet, Rfl: 3 .  metFORMIN (GLUCOPHAGE-XR) 500 MG 24 hr tablet, TAKE TWO (2) TABLETS BY MOUTH 2 TIMES DAILY, Disp: 360 tablet, Rfl: 3 .  pravastatin (PRAVACHOL) 40 MG tablet, TAKE 40 MG BY MOUTH DAILY, Disp: 90 tablet, Rfl: 1 .  sertraline (ZOLOFT) 25 MG tablet, Take 1 tablet (25 mg total) by mouth daily., Disp: 90 tablet, Rfl: 1 .  valACYclovir (VALTREX) 500 MG tablet, Take 1 tablet (500 mg total) by mouth 2 (two) times daily. Take for 3 days for acute flare, Disp: 60 tablet, Rfl: 6  No Known Allergies  Social  History   Socioeconomic History  . Marital status: Married    Spouse name: Not on file  . Number of children: 4  . Years of education: Not on file  . Highest education level: Not on file  Occupational History  . Occupation: [employer]    Employer: [employer]  Social Needs  . Financial resource strain: Not on file  . Food insecurity:    Worry: Not on file    Inability: Not on file  . Transportation needs:    Medical: Not on file    Non-medical: Not on file  Tobacco Use  . Smoking status: Never Smoker  . Smokeless tobacco: Never Used  Substance and Sexual Activity  . Alcohol use: Yes    Alcohol/week: 0.0 - 1.2 oz  . Drug use: No  . Sexual activity: Yes    Partners: Male    Birth control/protection: Post-menopausal    Comment: 1st spouse died 12-09-2022 secondary to respiratory failure of drug overdose. Remaried 2007  Lifestyle  . Physical activity:    Days per week: Not on file    Minutes  per session: Not on file  . Stress: Not on file  Relationships  . Social connections:    Talks on phone: Not on file    Gets together: Not on file    Attends religious service: Not on file    Active member of club or organization: Not on file    Attends meetings of clubs or organizations: Not on file    Relationship status: Not on file  . Intimate partner violence:    Fear of current or ex partner: Not on file    Emotionally abused: Not on file    Physically abused: Not on file    Forced sexual activity: Not on file  Other Topics Concern  . Not on file  Social History Narrative   Regular exercise --yes, treadmill 15 min 3 days per week    Originally from [place] and moved here 30 years ago and went to college at [employer] with a degree in the child education   Occasional drinker and nonsmoker   Has 4 children   Lives with her second husband         Objective:    General: AAO x3, NAD  Dermatological: Skin is warm, dry and supple bilateral. Nails x 10 are well manicured; remaining integument appears unremarkable at this time. There are no open sores, no preulcerative lesions, no rash or signs of infection present.  Vascular: Dorsalis Pedis artery and Posterior Tibial artery pedal pulses are 2/4 bilateral with immedate capillary fill time. Pedal hair growth present. No varicosities and no lower extremity edema present bilateral. There is no pain with calf compression, swelling, warmth, erythema.   Neruologic: Grossly intact via light touch bilateral. Vibratory intact via tuning fork bilateral. Protective threshold with Semmes Wienstein monofilament intact to all pedal sites bilateral.   Musculoskeletal: There is tenderness palpation of the plantar medial tubercle of the calcaneus and insertion of the plantar fashion the left foot.  There is no pain with lateral compression of the calcaneus.  Mild discomfort along the medial band of plantar fascia the arch of  the foot.  Plantar fascia appears to be intact.  Equinus is present.  No tenderness palpation of the Achilles tendon along the insertion.  On the right foot there is tenderness along the second interspace I am unable to palpate a neuroma today.  She has no numbness  or tingling to her toes.  There is no overlying edema, erythema, increased warmth.  There is no area pinpoint bony tenderness or pain to vibratory sensation.  Muscular strength 5/5 in all groups tested bilateral.  Gait: Unassisted, Nonantalgic.      Assessment & Plan:  62 year old female with left heel pain, plantar fasciitis; right foot capsulitis -Treatment options discussed including all alternatives, risks, and complications -Etiology of symptoms were discussed -X-rays were obtained and reviewed with the patient. No evidence of acute fracture or stress fracture. -We discussed a steroid injection with her blood sugars have been elevated.  Because of this we are going to hold off on an injection.  She can continue with anti-inflammatories.  I also prescribed Voltaren gel for her for both feet.  Ultimately we discussed the change in shoes as well as orthotics were inside of her shoes.  Discussed stretching, icing exercises daily.  Plantar fascial brace was dispensed. -In regards to the right foot I added a metatarsal pad may also give her syndrome pads as well.  Again I think the change in shoes as well as orthotics to be beneficial for her.  We discussed this at length today.  She can also continue with her massage as well as whirlpool therapy that she has been doing on her own.  This seems to be beneficial for her. -RTC 6 weeks or sooner if needed.  Trula Slade DPM

## 2017-06-08 NOTE — Patient Instructions (Signed)

## 2017-07-19 ENCOUNTER — Ambulatory Visit: Payer: BLUE CROSS/BLUE SHIELD | Admitting: Podiatry

## 2017-07-23 ENCOUNTER — Ambulatory Visit (INDEPENDENT_AMBULATORY_CARE_PROVIDER_SITE_OTHER): Payer: BLUE CROSS/BLUE SHIELD | Admitting: Podiatry

## 2017-07-23 ENCOUNTER — Other Ambulatory Visit: Payer: Self-pay | Admitting: Podiatry

## 2017-07-23 DIAGNOSIS — M7662 Achilles tendinitis, left leg: Secondary | ICD-10-CM | POA: Diagnosis not present

## 2017-07-23 DIAGNOSIS — M722 Plantar fascial fibromatosis: Secondary | ICD-10-CM | POA: Diagnosis not present

## 2017-07-23 DIAGNOSIS — M779 Enthesopathy, unspecified: Secondary | ICD-10-CM | POA: Diagnosis not present

## 2017-07-23 MED ORDER — DICLOFENAC EPOLAMINE 1.3 % TD PTCH: 1.0000 | MEDICATED_PATCH | Freq: Two times a day (BID) | TRANSDERMAL | 1 refills | Status: DC

## 2017-07-24 ENCOUNTER — Emergency Department (HOSPITAL_COMMUNITY): Payer: BLUE CROSS/BLUE SHIELD

## 2017-07-24 ENCOUNTER — Emergency Department (HOSPITAL_COMMUNITY)
Admission: EM | Admit: 2017-07-24 | Discharge: 2017-07-25 | Disposition: A | Discharge status: Home / Self Care | Payer: BLUE CROSS/BLUE SHIELD | Attending: Physician Assistant | Admitting: Physician Assistant

## 2017-07-24 ENCOUNTER — Encounter (HOSPITAL_COMMUNITY): Payer: Self-pay | Admitting: Emergency Medicine

## 2017-07-24 ENCOUNTER — Other Ambulatory Visit: Payer: Self-pay

## 2017-07-24 DIAGNOSIS — Y929 Unspecified place or not applicable: Secondary | ICD-10-CM | POA: Insufficient documentation

## 2017-07-24 DIAGNOSIS — E119 Type 2 diabetes mellitus without complications: Secondary | ICD-10-CM | POA: Insufficient documentation

## 2017-07-24 DIAGNOSIS — Z794 Long term (current) use of insulin: Secondary | ICD-10-CM | POA: Diagnosis not present

## 2017-07-24 DIAGNOSIS — Z79899 Other long term (current) drug therapy: Secondary | ICD-10-CM | POA: Diagnosis not present

## 2017-07-24 DIAGNOSIS — W541XXA Struck by dog, initial encounter: Secondary | ICD-10-CM | POA: Insufficient documentation

## 2017-07-24 DIAGNOSIS — Y999 Unspecified external cause status: Secondary | ICD-10-CM | POA: Diagnosis not present

## 2017-07-24 DIAGNOSIS — S82124A Nondisplaced fracture of lateral condyle of right tibia, initial encounter for closed fracture: Secondary | ICD-10-CM | POA: Insufficient documentation

## 2017-07-24 DIAGNOSIS — Z7982 Long term (current) use of aspirin: Secondary | ICD-10-CM | POA: Diagnosis not present

## 2017-07-24 DIAGNOSIS — S8991XA Unspecified injury of right lower leg, initial encounter: Secondary | ICD-10-CM | POA: Diagnosis present

## 2017-07-24 DIAGNOSIS — I10 Essential (primary) hypertension: Secondary | ICD-10-CM | POA: Diagnosis not present

## 2017-07-24 DIAGNOSIS — Y939 Activity, unspecified: Secondary | ICD-10-CM | POA: Insufficient documentation

## 2017-07-24 DIAGNOSIS — S82141A Displaced bicondylar fracture of right tibia, initial encounter for closed fracture: Secondary | ICD-10-CM

## 2017-07-24 MED ORDER — HYDROCODONE-ACETAMINOPHEN 5-325 MG PO TABS: 1.0000 | ORAL_TABLET | ORAL | 0 refills | Status: DC | PRN

## 2017-07-24 MED ORDER — HYDROCODONE-ACETAMINOPHEN 5-325 MG PO TABS
1.0000 | ORAL_TABLET | Freq: Once | ORAL | Status: AC
  Administered 2017-07-24: 1 via ORAL
  Filled 2017-07-24: qty 1

## 2017-07-24 NOTE — ED Provider Notes (Signed)
[facility] EMERGENCY DEPARTMENT Provider Note   CSN: [id number] Arrival date & time: 07/24/17  1853     History   Chief Complaint Chief Complaint  Patient presents with  . Knee Pain    HPI Jean Robinson is a 62 y.o. female.  HPI Patient presents to the emergency department with a right knee injury that occurred just prior to arrival.  Patient states that her daughter's dog was running towards her in a high rate of speed and ran directly into her knee and she fell to the ground.  The patient states she felt large pop in the knee.  Patient states that she took Tylenol prior to arrival with minimal relief of her symptoms.  Patient states she is unable to bear weight.  Patient states that nothing seems to make the condition better but movement and palpation make the pain worse.  Patient denies any other injuries.  Patient denies headache blurred vision, weakness, nausea, vomiting, chest pain shortness of breath or syncope. Past Medical History:  Diagnosis Date  . Allergy   . Diabetes mellitus   . H/O renal calculi 10/08/09  . Hyperlipidemia   . Hypertension   . STD (sexually transmitted disease)    HSV I & II, IgG reflex positive    Patient Active Problem List   Diagnosis Date Noted  . OA (osteoarthritis) of finger 03/05/2017  . Trigger finger, right middle finger 08/11/2016  . Hematuria 10/10/2015  . Genital herpes 10/10/2015  . Hypomagnesemia 11/16/2014  . Diabetes type 2, uncontrolled ([place])   . DEPRESSION, MILD 02/27/2008  . Hyperlipidemia LDL goal <100 11/18/2006  . Essential hypertension, benign 11/18/2006  . ALLERGIC RHINITIS 11/18/2006    Past Surgical History:  Procedure Laterality Date  . APPENDECTOMY    . CHOLECYSTECTOMY    . TONSILLECTOMY       OB History    Gravida  4   Para  4   Term  4   Preterm  0   AB  0   Living  4     SAB  0   TAB  0   Ectopic  0   Multiple  0   Live Births  4            Home Medications      Prior to Admission medications   Medication Sig Start Date End Date Taking? Authorizing Provider  aspirin 81 MG tablet Take 81 mg by mouth daily.      [provider]  diclofenac (FLECTOR) 1.3 % PTCH Place 1 patch onto the skin 2 (two) times daily. 07/23/17   Trula Slade, DPM  diclofenac (VOLTAREN) 75 MG EC tablet Take 1 tablet (75 mg total) by mouth 2 (two) times daily. 03/05/17   Bedsole, Amy E, MD  diclofenac sodium (VOLTAREN) 1 % GEL Apply 2 g topically 4 (four) times daily. Rub into affected area of foot 2 to 4 times daily 06/08/17   Trula Slade, DPM  glucose blood [facility] VERIO) test strip Use to check blood sugar twice daily.  Dx: E11.65 05/31/17   Jinny Sanders, MD  Insulin Detemir (LEVEMIR FLEXTOUCH) 100 UNIT/ML Pen Inject 45 Units into the skin daily at 10 pm. If fasting blood sugar > 120 for 2 or 3 days in row,  increase insulin by 2 units.  15 ml = 30 day supply 05/03/17   Bedsole, Amy E, MD  Insulin Pen Needle (PEN NEEDLES) 31G X 8 MM  MISC Use to inject Levemir daily 03/22/17   Jinny Sanders, MD  Lancet Devices (ONE TOUCH DELICA LANCING DEV) MISC Check blood sugar twice a day and as directed. Dx E11.65 05/13/16   Jinny Sanders, MD  latanoprost (XALATAN) 0.005 % ophthalmic solution Place 1 drop into both eyes at bedtime.  07/26/15   [provider]  lisinopril (PRINIVIL,ZESTRIL) 40 MG tablet Take 1 tablet (40 mg total) by mouth daily. 03/08/17   Elby Beck, FNP  metFORMIN (GLUCOPHAGE-XR) 500 MG 24 hr tablet TAKE TWO (2) TABLETS BY MOUTH 2 TIMES DAILY 04/30/17   Bedsole, Amy E, MD  pravastatin (PRAVACHOL) 40 MG tablet TAKE 40 MG BY MOUTH DAILY 03/05/17   Bedsole, Amy E, MD  sertraline (ZOLOFT) 25 MG tablet Take 1 tablet (25 mg total) by mouth daily. 03/05/17   Bedsole, Amy E, MD  valACYclovir (VALTREX) 500 MG tablet Take 1 tablet (500 mg total) by mouth 2 (two) times daily. Take for 3 days for acute flare 01/06/17   Regina Eck, CNM    Family  History Family History  Problem Relation Age of Onset  . Depression Father   . Diabetes Father   . COPD Mother   . Diabetes Mother   . Macular degeneration Mother   . Diabetes Brother   . Cancer Other        liver  . Diabetes Other     Social History Social History   Tobacco Use  . Smoking status: Never Smoker  . Smokeless tobacco: Never Used  Substance Use Topics  . Alcohol use: Yes    Alcohol/week: 0.0 - 1.2 oz  . Drug use: No     Allergies   Patient has no known allergies.   Review of Systems Review of Systems All other systems negative except as documented in the HPI. All pertinent positives and negatives as reviewed in the HPI. Physical Exam Updated Vital Signs BP 140/81   Pulse 80   Temp 97.7 F (36.5 C) (Oral)   Resp 16   LMP 02/02/2006   SpO2 98%   Physical Exam  Constitutional: She is oriented to person, place, and time. She appears well-developed and well-nourished. No distress.  HENT:  Head: Normocephalic and atraumatic.  Eyes: Pupils are equal, round, and reactive to light.  Cardiovascular: Normal rate and regular rhythm. Exam reveals no friction rub.  No murmur heard. Pulmonary/Chest: Effort normal and breath sounds normal. No respiratory distress.  Musculoskeletal:       Right knee: She exhibits decreased range of motion, swelling and effusion. She exhibits no ecchymosis, no deformity and no laceration. Tenderness found.  Neurological: She is alert and oriented to person, place, and time. She displays normal reflexes. No sensory deficit. She exhibits normal muscle tone.  Skin: Skin is warm and dry.  Psychiatric: She has a normal mood and affect.  Nursing note and vitals reviewed.    ED Treatments / Results  Labs (all labs ordered are listed, but only abnormal results are displayed) Labs Reviewed - No data to display  EKG None  Radiology Ct Knee Right Wo Contrast  Result Date: 07/24/2017 CLINICAL DATA:  Right knee pain after being  knocked down by her dog. Proximal tibial fracture on radiographs earlier today. EXAM: CT OF THE RIGHT KNEE WITHOUT CONTRAST TECHNIQUE: Multidetector CT imaging of the right knee was performed according to the standard protocol. Multiplanar CT image reconstructions were also generated. COMPARISON:  Right knee radiographs obtained earlier today. FINDINGS:  Bones/Joint/Cartilage Central lateral tibial plateau fracture with 6 mm of depression of the central fragment. No additional fractures are seen. There is an associated large effusion with a fat/fluid level. Ligaments Suboptimally assessed by CT. Muscles and Tendons Unremarkable. Soft tissues Mild subcutaneous edema laterally. IMPRESSION: 1. Central lateral tibial plateau fracture with 6 mm of depression. 2. Large lipohemarthrosis. Electronically Signed   By: Claudie Revering M.D.   On: 07/24/2017 23:36   Dg Knee Complete 4 Views Right  Result Date: 07/24/2017 CLINICAL DATA:  Fall with right knee pain EXAM: RIGHT KNEE - COMPLETE 4+ VIEW COMPARISON:  None. FINDINGS: Large knee effusion with hematocrit level. Mild patellofemoral degenerative change. Suspected intra-articular fracture of the proximal tibia. No dislocation IMPRESSION: Intra-articular fracture of the proximal tibia with large lipohemarthrosis Electronically Signed   By: Donavan Foil M.D.   On: 07/24/2017 20:57    Procedures Procedures (including critical care time)  Medications Ordered in ED Medications  HYDROcodone-acetaminophen (NORCO/VICODIN) 5-325 MG per tablet 1 tablet (1 tablet Oral Given 07/24/17 2338)     Initial Impression / Assessment and Plan / ED Course  I have reviewed the triage vital signs and the nursing notes.  Pertinent labs & imaging results that were available during my care of the patient were reviewed by me and considered in my medical decision making (see chart for details).     Spoke with Dr. Ninfa Linden of orthopedic surgery who will follow up patient in his office  on Monday.  The patient is advised the plan and all questions were answered we did get a CT scan of the knee to further evaluate the injury.  It does show a 6 mm depressed tibial plateau fracture.  He was advised the plan and all questions were answered.  We did place her in a knee immobilizer and she does have crutches.  Final Clinical Impressions(s) / ED Diagnoses   Final diagnoses:  None    ED Discharge Orders    None       Dalia Heading, PA-C 07/24/17 2354    Macarthur Critchley, MD 07/26/17 1545

## 2017-07-24 NOTE — Discharge Instructions (Signed)
Return here as needed.  Follow-up with the orthopedist provided above by calling his office first thing Monday morning tell them that we spoke with him while you are here in the ER.  Ice and elevate the knee.

## 2017-07-24 NOTE — ED Triage Notes (Signed)
Pt presents with right knee pain after being knocked down by her dog.

## 2017-07-25 NOTE — Progress Notes (Signed)
Subjective: 62 year old female presents the office today for follow-up evaluation of left heel pain and fall the right foot pain.  She states that the right foot does help with the metatarsal pads.  She states that she still gets pain in the back of her left heel mostly after standing for couple hours at work.  She is wondering if she should take some time off of work in order on the area to heal.  She denies any recent injury or trauma.  She denies any increase in swelling or redness.  She has no new concerns. Denies any systemic complaints such as fevers, chills, nausea, vomiting. No acute changes since last appointment, and no other complaints at this time.   Objective: AAO x3, NAD DP/PT pulses palpable bilaterally, CRT less than 3 seconds There is decreased tenderness in the right side along the second metatarsal heads.  There is no area pinpoint bony tenderness or pain to vibratory sensation on the right side.  Left side there is prominence of the posterior aspect the calcaneus.  Heel spur palpable to the posterior aspect there is mild tenderness palpation of this area as well as on the plantar medial tubercle of the calcaneus at the insertion of plantar fascia.  The fascia, Achilles tendon appears to be intact.  No pain with lateral compression of the calcaneus.  No open lesions or pre-ulcerative lesions.  No pain with calf compression, swelling, warmth, erythema  Assessment: Right metatarsalgia, left heel pain  Plan: -All treatment options discussed with the patient including all alternatives, risks, complications.  -Discussion regards to treatment options.  There is moderate tenderness at the posterior aspect of the calcaneus.  I ordered a flutter patch for the area.  She gets the majority tenderness after she does stand for couple hours at work.  Continue with supportive shoes as well as inserts.  We discussed stretching, icing daily.  She can work out with her job may be take 1 to 2 weeks off  of work and she needs we can provide a note for this.  She is to call and let me know.  Continue metatarsal offloading pad on the right side.   -Patient encouraged to call the office with any questions, concerns, change in symptoms.   Trula Slade DPM

## 2017-07-26 ENCOUNTER — Other Ambulatory Visit: Payer: Self-pay | Admitting: Orthopedic Surgery

## 2017-07-27 ENCOUNTER — Other Ambulatory Visit: Payer: Self-pay

## 2017-07-27 ENCOUNTER — Encounter (HOSPITAL_BASED_OUTPATIENT_CLINIC_OR_DEPARTMENT_OTHER): Payer: Self-pay | Admitting: *Deleted

## 2017-07-29 ENCOUNTER — Encounter (HOSPITAL_BASED_OUTPATIENT_CLINIC_OR_DEPARTMENT_OTHER)
Admission: RE | Admit: 2017-07-29 | Discharge: 2017-07-29 | Disposition: A | Discharge status: Home / Self Care | Payer: BLUE CROSS/BLUE SHIELD | Source: Ambulatory Visit | Attending: Orthopedic Surgery | Admitting: Orthopedic Surgery

## 2017-07-29 DIAGNOSIS — Z01812 Encounter for preprocedural laboratory examination: Secondary | ICD-10-CM | POA: Diagnosis not present

## 2017-07-29 DIAGNOSIS — Z01810 Encounter for preprocedural cardiovascular examination: Secondary | ICD-10-CM | POA: Diagnosis present

## 2017-07-29 DIAGNOSIS — I10 Essential (primary) hypertension: Secondary | ICD-10-CM | POA: Insufficient documentation

## 2017-07-29 DIAGNOSIS — E119 Type 2 diabetes mellitus without complications: Secondary | ICD-10-CM | POA: Diagnosis not present

## 2017-07-29 LAB — BASIC METABOLIC PANEL
ANION GAP: 13 (ref 5–15)
BUN: 25 mg/dL — ABNORMAL HIGH (ref 8–23)
CALCIUM: 10.5 mg/dL — AB (ref 8.9–10.3)
CO2: 25 mmol/L (ref 22–32)
Chloride: 103 mmol/L (ref 98–111)
Creatinine, Ser: 1.06 mg/dL — ABNORMAL HIGH (ref 0.44–1.00)
GFR calc non Af Amer: 55 mL/min — ABNORMAL LOW (ref >60)
GLUCOSE: 175 mg/dL — AB (ref 70–99)
Potassium: 4 mmol/L (ref 3.5–5.1)
Sodium: 141 mmol/L (ref 135–145)

## 2017-07-29 NOTE — Progress Notes (Signed)
EKG reviewed by Dr. Odonno, will proceed with surgery as scheduled. 

## 2017-07-30 ENCOUNTER — Ambulatory Visit (HOSPITAL_BASED_OUTPATIENT_CLINIC_OR_DEPARTMENT_OTHER)
Admission: RE | Admit: 2017-07-30 | Discharge: 2017-07-30 | Disposition: A | Discharge status: Home / Self Care | Payer: BLUE CROSS/BLUE SHIELD | Source: Ambulatory Visit | Attending: Orthopedic Surgery | Admitting: Orthopedic Surgery

## 2017-07-30 ENCOUNTER — Ambulatory Visit (HOSPITAL_BASED_OUTPATIENT_CLINIC_OR_DEPARTMENT_OTHER): Payer: BLUE CROSS/BLUE SHIELD | Admitting: Anesthesiology

## 2017-07-30 ENCOUNTER — Encounter (HOSPITAL_BASED_OUTPATIENT_CLINIC_OR_DEPARTMENT_OTHER)
Admission: RE | Disposition: A | Discharge status: Home / Self Care | Payer: Self-pay | Source: Ambulatory Visit | Attending: Orthopedic Surgery

## 2017-07-30 ENCOUNTER — Ambulatory Visit (HOSPITAL_COMMUNITY): Payer: BLUE CROSS/BLUE SHIELD

## 2017-07-30 ENCOUNTER — Encounter (HOSPITAL_BASED_OUTPATIENT_CLINIC_OR_DEPARTMENT_OTHER): Payer: Self-pay | Admitting: Anesthesiology

## 2017-07-30 DIAGNOSIS — S82121A Displaced fracture of lateral condyle of right tibia, initial encounter for closed fracture: Secondary | ICD-10-CM | POA: Diagnosis present

## 2017-07-30 DIAGNOSIS — E785 Hyperlipidemia, unspecified: Secondary | ICD-10-CM | POA: Insufficient documentation

## 2017-07-30 DIAGNOSIS — S82141A Displaced bicondylar fracture of right tibia, initial encounter for closed fracture: Secondary | ICD-10-CM | POA: Diagnosis present

## 2017-07-30 DIAGNOSIS — Z9049 Acquired absence of other specified parts of digestive tract: Secondary | ICD-10-CM | POA: Diagnosis not present

## 2017-07-30 DIAGNOSIS — F329 Major depressive disorder, single episode, unspecified: Secondary | ICD-10-CM | POA: Insufficient documentation

## 2017-07-30 DIAGNOSIS — W541XXA Struck by dog, initial encounter: Secondary | ICD-10-CM | POA: Insufficient documentation

## 2017-07-30 DIAGNOSIS — Z4789 Encounter for other orthopedic aftercare: Secondary | ICD-10-CM

## 2017-07-30 DIAGNOSIS — E119 Type 2 diabetes mellitus without complications: Secondary | ICD-10-CM | POA: Insufficient documentation

## 2017-07-30 DIAGNOSIS — Z7982 Long term (current) use of aspirin: Secondary | ICD-10-CM | POA: Insufficient documentation

## 2017-07-30 DIAGNOSIS — Z79899 Other long term (current) drug therapy: Secondary | ICD-10-CM | POA: Insufficient documentation

## 2017-07-30 DIAGNOSIS — Z794 Long term (current) use of insulin: Secondary | ICD-10-CM | POA: Insufficient documentation

## 2017-07-30 DIAGNOSIS — I10 Essential (primary) hypertension: Secondary | ICD-10-CM | POA: Insufficient documentation

## 2017-07-30 DIAGNOSIS — Z791 Long term (current) use of non-steroidal anti-inflammatories (NSAID): Secondary | ICD-10-CM | POA: Diagnosis not present

## 2017-07-30 DIAGNOSIS — F419 Anxiety disorder, unspecified: Secondary | ICD-10-CM | POA: Diagnosis not present

## 2017-07-30 HISTORY — DX: Depression, unspecified: F32.A

## 2017-07-30 HISTORY — PX: ORIF TIBIA PLATEAU: SHX2132

## 2017-07-30 HISTORY — DX: Displaced fracture of lateral condyle of right tibia, initial encounter for closed fracture: S82.121A

## 2017-07-30 HISTORY — DX: Anxiety disorder, unspecified: F41.9

## 2017-07-30 HISTORY — DX: Displaced bicondylar fracture of right tibia, initial encounter for closed fracture: S82.141A

## 2017-07-30 HISTORY — DX: Major depressive disorder, single episode, unspecified: F32.9

## 2017-07-30 LAB — GLUCOSE, CAPILLARY: Glucose-Capillary: 114 mg/dL — ABNORMAL HIGH (ref 70–99)

## 2017-07-30 SURGERY — OPEN REDUCTION INTERNAL FIXATION (ORIF) TIBIAL PLATEAU
Anesthesia: General | Site: Knee | Laterality: Right

## 2017-07-30 MED ORDER — ACETAMINOPHEN 10 MG/ML IV SOLN
INTRAVENOUS | Status: AC
  Filled 2017-07-30: qty 100

## 2017-07-30 MED ORDER — HYDROMORPHONE HCL 1 MG/ML IJ SOLN
INTRAMUSCULAR | Status: AC
  Filled 2017-07-30: qty 1

## 2017-07-30 MED ORDER — KETOROLAC TROMETHAMINE 30 MG/ML IJ SOLN: 30.0000 mg | Freq: Once | INTRAMUSCULAR | Status: DC | PRN

## 2017-07-30 MED ORDER — DEXAMETHASONE SODIUM PHOSPHATE 4 MG/ML IJ SOLN
INTRAMUSCULAR | Status: DC | PRN
  Administered 2017-07-30: 10 mg via INTRAVENOUS

## 2017-07-30 MED ORDER — LIDOCAINE 2% (20 MG/ML) 5 ML SYRINGE
INTRAMUSCULAR | Status: DC | PRN
  Administered 2017-07-30: 60 mg via INTRAVENOUS

## 2017-07-30 MED ORDER — BUPIVACAINE HCL (PF) 0.5 % IJ SOLN
INTRAMUSCULAR | Status: DC | PRN
  Administered 2017-07-30: 20 mL

## 2017-07-30 MED ORDER — CEFAZOLIN SODIUM-DEXTROSE 2-4 GM/100ML-% IV SOLN
2.0000 g | INTRAVENOUS | Status: AC
  Administered 2017-07-30: 2 g via INTRAVENOUS

## 2017-07-30 MED ORDER — FENTANYL CITRATE (PF) 100 MCG/2ML IJ SOLN
INTRAMUSCULAR | Status: AC
  Filled 2017-07-30: qty 2

## 2017-07-30 MED ORDER — MIDAZOLAM HCL 2 MG/2ML IJ SOLN
INTRAMUSCULAR | Status: AC
  Filled 2017-07-30: qty 2

## 2017-07-30 MED ORDER — KETOROLAC TROMETHAMINE 15 MG/ML IJ SOLN
INTRAMUSCULAR | Status: AC
  Filled 2017-07-30: qty 1

## 2017-07-30 MED ORDER — HYDROMORPHONE HCL 1 MG/ML IJ SOLN
0.2500 mg | INTRAMUSCULAR | Status: DC | PRN
  Administered 2017-07-30 (×2): 0.5 mg via INTRAVENOUS

## 2017-07-30 MED ORDER — PROPOFOL 10 MG/ML IV BOLUS
INTRAVENOUS | Status: AC
  Filled 2017-07-30: qty 20

## 2017-07-30 MED ORDER — ONDANSETRON HCL 4 MG/2ML IJ SOLN
INTRAMUSCULAR | Status: DC | PRN
  Administered 2017-07-30: 4 mg via INTRAVENOUS

## 2017-07-30 MED ORDER — CHLORHEXIDINE GLUCONATE 4 % EX LIQD: 60.0000 mL | Freq: Once | CUTANEOUS | Status: DC

## 2017-07-30 MED ORDER — MORPHINE SULFATE (PF) 10 MG/ML IV SOLN
INTRAVENOUS | Status: AC
  Filled 2017-07-30: qty 1

## 2017-07-30 MED ORDER — LACTATED RINGERS IV SOLN
INTRAVENOUS | Status: DC
  Administered 2017-07-30: 12:00:00 via INTRAVENOUS

## 2017-07-30 MED ORDER — HYDROMORPHONE HCL 1 MG/ML IJ SOLN
INTRAMUSCULAR | Status: AC
  Filled 2017-07-30: qty 0.5

## 2017-07-30 MED ORDER — FENTANYL CITRATE (PF) 100 MCG/2ML IJ SOLN
50.0000 ug | INTRAMUSCULAR | Status: DC | PRN
  Administered 2017-07-30: 100 ug via INTRAVENOUS

## 2017-07-30 MED ORDER — MIDAZOLAM HCL 2 MG/2ML IJ SOLN
1.0000 mg | INTRAMUSCULAR | Status: DC | PRN
  Administered 2017-07-30: 2 mg via INTRAVENOUS

## 2017-07-30 MED ORDER — ACETAMINOPHEN 10 MG/ML IV SOLN
1000.0000 mg | Freq: Once | INTRAVENOUS | Status: AC
Start: 2017-07-30 — End: 2017-07-30
  Administered 2017-07-30: 1000 mg via INTRAVENOUS

## 2017-07-30 MED ORDER — PROMETHAZINE HCL 25 MG/ML IJ SOLN: 6.2500 mg | INTRAMUSCULAR | Status: DC | PRN

## 2017-07-30 MED ORDER — SCOPOLAMINE 1 MG/3DAYS TD PT72: 1.0000 | MEDICATED_PATCH | Freq: Once | TRANSDERMAL | Status: DC | PRN

## 2017-07-30 MED ORDER — OXYCODONE HCL 5 MG PO TABS: 5.0000 mg | ORAL_TABLET | ORAL | 0 refills | Status: DC | PRN

## 2017-07-30 MED ORDER — ONDANSETRON HCL 4 MG PO TABS: 4.0000 mg | ORAL_TABLET | Freq: Three times a day (TID) | ORAL | 0 refills | Status: DC | PRN

## 2017-07-30 MED ORDER — OXYCODONE HCL 5 MG PO TABS: 5.0000 mg | ORAL_TABLET | Freq: Once | ORAL | Status: DC | PRN

## 2017-07-30 MED ORDER — CEFAZOLIN SODIUM-DEXTROSE 2-4 GM/100ML-% IV SOLN
INTRAVENOUS | Status: AC
  Filled 2017-07-30: qty 100

## 2017-07-30 MED ORDER — KETOROLAC TROMETHAMINE 30 MG/ML IJ SOLN
15.0000 mg | Freq: Once | INTRAMUSCULAR | Status: AC | PRN
  Administered 2017-07-30: 15 mg via INTRAVENOUS

## 2017-07-30 MED ORDER — PROPOFOL 10 MG/ML IV BOLUS
INTRAVENOUS | Status: DC | PRN
  Administered 2017-07-30: 160 mg via INTRAVENOUS

## 2017-07-30 MED ORDER — OXYCODONE HCL 5 MG/5ML PO SOLN: 5.0000 mg | Freq: Once | ORAL | Status: DC | PRN

## 2017-07-30 MED ORDER — PHENYLEPHRINE HCL 10 MG/ML IJ SOLN
INTRAVENOUS | Status: DC | PRN
  Administered 2017-07-30: 50 ug/min via INTRAVENOUS

## 2017-07-30 MED ORDER — SENNA-DOCUSATE SODIUM 8.6-50 MG PO TABS: 2.0000 | ORAL_TABLET | Freq: Every day | ORAL | 1 refills | Status: DC

## 2017-07-30 MED ORDER — MORPHINE SULFATE 10 MG/ML IJ SOLN
INTRAMUSCULAR | Status: DC | PRN
  Administered 2017-07-30 (×3): 2 mg via INTRAVENOUS

## 2017-07-30 SURGICAL SUPPLY — 74 items
BANDAGE ACE 4X5 VEL STRL LF (GAUZE/BANDAGES/DRESSINGS) IMPLANT
BANDAGE ACE 6X5 VEL STRL LF (GAUZE/BANDAGES/DRESSINGS) ×2 IMPLANT
BANDAGE ESMARK 6X9 LF (GAUZE/BANDAGES/DRESSINGS) ×1 IMPLANT
BIT DRILL 100X2.5XANTM LCK (BIT) ×1 IMPLANT
BIT DRILL CAL (BIT) ×1 IMPLANT
BIT DRILL SLEEVE MEASURING (BIT) ×1 IMPLANT
BIT DRL 100X2.5XANTM LCK (BIT) ×1
BLADE SURG 10 STRL SS (BLADE) ×2 IMPLANT
BLADE SURG 15 STRL LF DISP TIS (BLADE) ×1 IMPLANT
BLADE SURG 15 STRL SS (BLADE) ×2
BNDG CMPR 9X6 STRL LF SNTH (GAUZE/BANDAGES/DRESSINGS) ×1
BNDG ESMARK 6X9 LF (GAUZE/BANDAGES/DRESSINGS) ×2
BONE CHIP PRESERV 5CC PCAN5 (Bone Implant) ×2 IMPLANT
CANISTER SUCT 1200ML W/VALVE (MISCELLANEOUS) IMPLANT
CLSR STERI-STRIP ANTIMIC 1/2X4 (GAUZE/BANDAGES/DRESSINGS) ×2 IMPLANT
CUFF TOURNIQUET SINGLE 34IN LL (TOURNIQUET CUFF) IMPLANT
DECANTER SPIKE VIAL GLASS SM (MISCELLANEOUS) IMPLANT
DRAPE C-ARM 42X72 X-RAY (DRAPES) ×2 IMPLANT
DRAPE C-ARMOR (DRAPES) ×2 IMPLANT
DRAPE EXTREMITY T 121X128X90 (DRAPE) ×2 IMPLANT
DRAPE U-SHAPE 47X51 STRL (DRAPES) ×2 IMPLANT
DRILL BIT 2.5MM (BIT) ×2
DRILL BIT CAL (BIT) ×2
DRILL SLEEVE MEASURING (BIT) ×2
DRSG PAD ABDOMINAL 8X10 ST (GAUZE/BANDAGES/DRESSINGS) ×2 IMPLANT
DURAPREP 26ML APPLICATOR (WOUND CARE) ×2 IMPLANT
ELECT REM PT RETURN 9FT ADLT (ELECTROSURGICAL) ×2
ELECTRODE REM PT RTRN 9FT ADLT (ELECTROSURGICAL) ×1 IMPLANT
GAUZE SPONGE 4X4 12PLY STRL (GAUZE/BANDAGES/DRESSINGS) ×2 IMPLANT
GLOVE BIO SURGEON STRL SZ8 (GLOVE) ×2 IMPLANT
GLOVE BIOGEL PI IND STRL 8 (GLOVE) ×2 IMPLANT
GLOVE BIOGEL PI INDICATOR 8 (GLOVE) ×2
GLOVE ORTHO TXT STRL SZ7.5 (GLOVE) ×2 IMPLANT
GOWN STRL REUS W/ TWL LRG LVL3 (GOWN DISPOSABLE) ×1 IMPLANT
GOWN STRL REUS W/ TWL XL LVL3 (GOWN DISPOSABLE) ×2 IMPLANT
GOWN STRL REUS W/TWL LRG LVL3 (GOWN DISPOSABLE) ×2
GOWN STRL REUS W/TWL XL LVL3 (GOWN DISPOSABLE) ×4
IMMOBILIZER KNEE 22 UNIV (SOFTGOODS) IMPLANT
IMMOBILIZER KNEE 24 THIGH 36 (MISCELLANEOUS) IMPLANT
IMMOBILIZER KNEE 24 UNIV (MISCELLANEOUS)
K-WIRE ACE 1.6X6 (WIRE) ×4
KWIRE ACE 1.6X6 (WIRE) ×2 IMPLANT
NEEDLE HYPO 25X1 1.5 SAFETY (NEEDLE) IMPLANT
NS IRRIG 1000ML POUR BTL (IV SOLUTION) ×2 IMPLANT
PACK ARTHROSCOPY DSU (CUSTOM PROCEDURE TRAY) ×2 IMPLANT
PACK BASIN DAY SURGERY FS (CUSTOM PROCEDURE TRAY) ×2 IMPLANT
PENCIL BUTTON HOLSTER BLD 10FT (ELECTRODE) ×2 IMPLANT
PLATE LOCK 3H STD RT PROX TIB (Plate) ×2 IMPLANT
SCREW CORTICAL 3.5MM 40MM (Screw) ×2 IMPLANT
SCREW LOCK CORT STAR 3.5X46 (Screw) ×2 IMPLANT
SCREW LOCK CORT STAR 3.5X65 (Screw) ×8 IMPLANT
SCREW LP 3.5X75MM (Screw) ×2 IMPLANT
SHEET MEDIUM DRAPE 40X70 STRL (DRAPES) ×2 IMPLANT
SLEEVE SCD COMPRESS KNEE MED (MISCELLANEOUS) ×2 IMPLANT
SPLINT FAST PLASTER 5X30 (CAST SUPPLIES)
SPLINT PLASTER CAST FAST 5X30 (CAST SUPPLIES) IMPLANT
SPONGE LAP 18X18 RF (DISPOSABLE) ×4 IMPLANT
STAPLER VISISTAT 35W (STAPLE) IMPLANT
SUCTION FRAZIER HANDLE 10FR (MISCELLANEOUS) ×1
SUCTION TUBE FRAZIER 10FR DISP (MISCELLANEOUS) ×1 IMPLANT
SUT ETHILON 3 0 PS 1 (SUTURE) IMPLANT
SUT ETHILON 4 0 PS 2 18 (SUTURE) IMPLANT
SUT MNCRL AB 4-0 PS2 18 (SUTURE) ×2 IMPLANT
SUT VIC AB 0 CT1 27 (SUTURE) ×2
SUT VIC AB 0 CT1 27XBRD ANBCTR (SUTURE) ×1 IMPLANT
SUT VIC AB 2-0 SH 18 (SUTURE) ×2 IMPLANT
SUT VIC AB 3-0 SH 27 (SUTURE) ×1
SUT VIC AB 3-0 SH 27X BRD (SUTURE) ×1 IMPLANT
SUT VICRYL 3-0 CR8 SH (SUTURE) ×2 IMPLANT
SUT VICRYL 4-0 PS2 18IN ABS (SUTURE) IMPLANT
SYR BULB IRRIGATION 50ML (SYRINGE) ×2 IMPLANT
SYR CONTROL 10ML LL (SYRINGE) IMPLANT
UNDERPAD 30X30 (UNDERPADS AND DIAPERS) ×2 IMPLANT
YANKAUER SUCT BULB TIP NO VENT (SUCTIONS) IMPLANT

## 2017-07-30 NOTE — H&P (Signed)
PREOPERATIVE H&P  Chief Complaint: right knee pain  HPI: Jean Robinson is a 62 y.o. female who presents for preoperative history and physical with a diagnosis of right tibial plateau fracture. Symptoms are rated as moderate to severe, and have been worsening.  This is significantly impairing activities of daily living.  She has elected for surgical management.   This happened after a fall after a dog ran into her on Saturday. She was seen in the emergency room and diagnosed with a tibial plateau fracture. She has been in a knee immobilizer. She has somewhat poorly controlled diabetes although she has been started on insulin over the past couple of months. The pain is a 3/10. Family history for diabetes. She is a nonsmoker. She works at [employer]. Her review of systems is otherwise negative.  Past Medical History:  Diagnosis Date  . Allergy   . Anxiety   . Depression   . Diabetes mellitus   . H/O renal calculi 10/08/09  . Hyperlipidemia   . Hypertension   . STD (sexually transmitted disease)    HSV I & II, IgG reflex positive  . Tibial plateau fracture, right    Past Surgical History:  Procedure Laterality Date  . APPENDECTOMY    . CHOLECYSTECTOMY    . TONSILLECTOMY     Social History   Socioeconomic History  . Marital status: Married    Spouse name: Not on file  . Number of children: 4  . Years of education: Not on file  . Highest education level: Not on file  Occupational History  . Occupation: [employer]    Employer: [employer]  Social Needs  . Financial resource strain: Not on file  . Food insecurity:    Worry: Not on file    Inability: Not on file  . Transportation needs:    Medical: Not on file    Non-medical: Not on file  Tobacco Use  . Smoking status: Never Smoker  . Smokeless tobacco: Never Used  Substance and Sexual Activity  . Alcohol use: Yes    Alcohol/week: 0.0 - 1.2 oz    Comment: social  . Drug use: No  . Sexual activity: Yes    Partners: Male    Birth  control/protection: Post-menopausal  Lifestyle  . Physical activity:    Days per week: Not on file    Minutes per session: Not on file  . Stress: Not on file  Relationships  . Social connections:    Talks on phone: Not on file    Gets together: Not on file    Attends religious service: Not on file    Active member of club or organization: Not on file    Attends meetings of clubs or organizations: Not on file    Relationship status: Not on file  Other Topics Concern  . Not on file  Social History Narrative   Regular exercise --yes, treadmill 15 min 3 days per week    Originally from [place] and moved here 30 years ago and went to college at [employer] with a degree in the child education   Occasional drinker and nonsmoker   Has 4 children   Lives with her second husband   Family History  Problem Relation Age of Onset  . Depression Father   . Diabetes Father   . COPD Mother   . Diabetes Mother   . Macular degeneration Mother   . Diabetes Brother   . Cancer Other  liver  . Diabetes Other    No Known Allergies Prior to Admission medications   Medication Sig Start Date End Date Taking? Authorizing Provider  aspirin 81 MG tablet Take 81 mg by mouth daily.     Yes [provider]  HYDROcodone-acetaminophen (NORCO/VICODIN) 5-325 MG tablet Take 1-2 tablets by mouth every 4 (four) hours as needed for moderate pain. 07/24/17  Yes Lawyer, Harrell Gave, PA-C  Insulin Detemir (LEVEMIR FLEXTOUCH) 100 UNIT/ML Pen Inject 45 Units into the skin daily at 10 pm. If fasting blood sugar > 120 for 2 or 3 days in row,  increase insulin by 2 units.  15 ml = 30 day supply Patient taking differently: Inject 50 Units into the skin daily at 10 pm. If fasting blood sugar > 120 for 2 or 3 days in row,  increase insulin by 2 units.  15 ml = 30 day supply 05/03/17  Yes Bedsole, Amy E, MD  latanoprost (XALATAN) 0.005 % ophthalmic solution Place 1 drop into both eyes at  bedtime.  07/26/15  Yes [provider]  lisinopril (PRINIVIL,ZESTRIL) 40 MG tablet Take 1 tablet (40 mg total) by mouth daily. 03/08/17  Yes Elby Beck, FNP  metFORMIN (GLUCOPHAGE-XR) 500 MG 24 hr tablet TAKE TWO (2) TABLETS BY MOUTH 2 TIMES DAILY 04/30/17  Yes Bedsole, Amy E, MD  pravastatin (PRAVACHOL) 40 MG tablet TAKE 40 MG BY MOUTH DAILY 03/05/17  Yes Bedsole, Amy E, MD  sertraline (ZOLOFT) 25 MG tablet Take 1 tablet (25 mg total) by mouth daily. 03/05/17  Yes Bedsole, Amy E, MD  valACYclovir (VALTREX) 500 MG tablet Take 1 tablet (500 mg total) by mouth 2 (two) times daily. Take for 3 days for acute flare 01/06/17  Yes Regina Eck, CNM  diclofenac sodium (VOLTAREN) 1 % GEL Apply 2 g topically 4 (four) times daily. Rub into affected area of foot 2 to 4 times daily 06/08/17   Trula Slade, DPM  glucose blood [facility] VERIO) test strip Use to check blood sugar twice daily.  Dx: E11.65 05/31/17   Jinny Sanders, MD  Insulin Pen Needle (PEN NEEDLES) 31G X 8 MM MISC Use to inject Levemir daily 03/22/17   Jinny Sanders, MD  Lancet Devices (ONE TOUCH DELICA LANCING DEV) MISC Check blood sugar twice a day and as directed. Dx E11.65 05/13/16   Jinny Sanders, MD     Positive ROS: All other systems have been reviewed and were otherwise negative with the exception of those mentioned in the HPI and as above.  Physical Exam: General: Alert, no acute distress Cardiovascular: No pedal edema Respiratory: No cyanosis, no use of accessory musculature GI: No organomegaly, abdomen is soft and non-tender Skin: No lesions in the area of chief complaint Neurologic: Sensation intact distally Psychiatric: Patient is competent for consent with normal mood and affect Lymphatic: No axillary or cervical lymphadenopathy  MUSCULOSKELETAL: right knee with bruising and swelling and painful arc of motion.  Assessment: Right lateral displaced tibial plateau fracture   Plan: Plan for  Procedure(s): OPEN REDUCTION INTERNAL FIXATION (ORIF) TIBIAL PLATEAU  The risks benefits and alternatives were discussed with the patient including but not limited to the risks of nonoperative treatment, versus surgical intervention including infection, bleeding, nerve injury, malunion, nonunion, the need for revision surgery, hardware prominence, hardware failure, the need for hardware removal, blood clots, cardiopulmonary complications, morbidity, mortality, among others, and they were willing to proceed.     Johnny Bridge, MD Cell [phone])  [phone]   07/30/2017 12:33 PM

## 2017-07-30 NOTE — Anesthesia Preprocedure Evaluation (Signed)
Anesthesia Evaluation  Patient identified by MRN, date of birth, ID band Patient awake    Reviewed: Allergy & Precautions, NPO status , Patient's Chart, lab work & pertinent test results  Airway Mallampati: II  TM Distance: >3 FB Neck ROM: Full    Dental no notable dental hx.    Pulmonary neg pulmonary ROS,    Pulmonary exam normal breath sounds clear to auscultation       Cardiovascular hypertension, Normal cardiovascular exam Rhythm:Regular Rate:Normal     Neuro/Psych Anxiety negative neurological ROS     GI/Hepatic negative GI ROS, Neg liver ROS,   Endo/Other  diabetes, Insulin Dependent  Renal/GU negative Renal ROS  negative genitourinary   Musculoskeletal negative musculoskeletal ROS (+)   Abdominal   Peds negative pediatric ROS (+)  Hematology negative hematology ROS (+)   Anesthesia Other Findings   Reproductive/Obstetrics negative OB ROS                             Anesthesia Physical Anesthesia Plan  ASA: III  Anesthesia Plan: General   Post-op Pain Management:    Induction: Intravenous  PONV Risk Score and Plan: 3 and Ondansetron, Dexamethasone, Midazolam and Treatment may vary due to age or medical condition  Airway Management Planned: LMA and Oral ETT  Additional Equipment:   Intra-op Plan:   Post-operative Plan: Extubation in OR  Informed Consent: I have reviewed the patients History and Physical, chart, labs and discussed the procedure including the risks, benefits and alternatives for the proposed anesthesia with the patient or authorized representative who has indicated his/her understanding and acceptance.   Dental advisory given  Plan Discussed with: CRNA and Surgeon  Anesthesia Plan Comments:         Anesthesia Quick Evaluation

## 2017-07-30 NOTE — Discharge Instructions (Addendum)
Diet: As you were doing prior to hospitalization   Shower:  May shower but keep the wounds dry, use an occlusive plastic wrap, NO SOAKING IN TUB.  If the bandage gets wet, change with a clean dry gauze.  If you have a splint on, leave the splint in place and keep the splint dry with a plastic bag.  Dressing:  Leave wrap and knee immobilizer in place.  If you had hand or foot surgery, we will plan to remove your stitches in about 2 weeks in the office.  For all other surgeries, there are sticky tapes (steri-strips) on your wounds and all the stitches are absorbable.  Leave the steri-strips in place when changing your dressings, they will peel off with time, usually 2-3 weeks.  Activity:  Increase activity slowly as tolerated, but follow the weight bearing instructions below.  The rules on driving is that you can not be taking narcotics while you drive, and you must feel in control of the vehicle.    Weight Bearing:   No weight on right leg.    To prevent constipation: you may use a stool softener such as -  Colace (over the counter) 100 mg by mouth twice a day  Drink plenty of fluids (prune juice may be helpful) and high fiber foods Miralax (over the counter) for constipation as needed.    Itching:  If you experience itching with your medications, try taking only a single pain pill, or even half a pain pill at a time.  You may take up to 10 pain pills per day, and you can also use benadryl over the counter for itching or also to help with sleep.   Precautions:  If you experience chest pain or shortness of breath - call 911 immediately for transfer to the hospital emergency department!!  If you develop a fever greater that 101 F, purulent drainage from wound, increased redness or drainage from wound, or calf pain -- Call the office at [phone]                                                Follow- Up Appointment:  Please call for an appointment to be seen in 2 weeks [place] -  [phone]   Post Anesthesia Home Care Instructions  Activity: Get plenty of rest for the remainder of the day. A responsible individual must stay with you for 24 hours following the procedure.  For the next 24 hours, DO NOT: -Drive a car -Paediatric nurse -Drink alcoholic beverages -Take any medication unless instructed by your physician -Make any legal decisions or sign important papers.  Meals: Start with liquid foods such as gelatin or soup. Progress to regular foods as tolerated. Avoid greasy, spicy, heavy foods. If nausea and/or vomiting occur, drink only clear liquids until the nausea and/or vomiting subsides. Call your physician if vomiting continues.  Special Instructions/Symptoms: Your throat may feel dry or sore from the anesthesia or the breathing tube placed in your throat during surgery. If this causes discomfort, gargle with warm salt water. The discomfort should disappear within 24 hours.  If you had a scopolamine patch placed behind your ear for the management of post- operative nausea and/or vomiting:  1. The medication in the patch is effective for 72 hours, after which it should be removed.  Wrap patch in a tissue and discard in  the trash. Wash hands thoroughly with soap and water. 2. You may remove the patch earlier than 72 hours if you experience unpleasant side effects which may include dry mouth, dizziness or visual disturbances. 3. Avoid touching the patch. Wash your hands with soap and water after contact with the patch.    *Last tylenol given around 4:00pm, do not repeat for at least4 hours *Last NSAID ("toradol") given around 4:00pm, do not repeat for at least 6 hours

## 2017-07-30 NOTE — Op Note (Signed)
07/30/2017  2:55 PM  PATIENT:  Jean Robinson    PRE-OPERATIVE DIAGNOSIS: Right displaced lateral tibial plateau fracture  POST-OPERATIVE DIAGNOSIS:  Same  PROCEDURE:  OPEN REDUCTION INTERNAL FIXATION (ORIF) TIBIAL PLATEAU  SURGEON:  Johnny Bridge, MD  PHYSICIAN ASSISTANT: HC Martinson, PA-C present and scrubbed throughout the case, critical for completion in a timely fashion, and for retraction, instrumentation, and closure.  ANESTHESIA:   General  PREOPERATIVE INDICATIONS:  Jean Robinson is a  62 y.o. female with a diagnosis of depressed lateral tibial plateau fracture who failed conservative measures and elected for surgical management.    The risks benefits and alternatives were discussed with the patient preoperatively including but not limited to the risks of infection, bleeding, nerve injury, cardiopulmonary complications, the need for revision surgery, among others, and the patient was willing to proceed.  ESTIMATED BLOOD LOSS: Minimal  OPERATIVE IMPLANTS: Biomet Alps proximal tibial short precontoured locking plate  OPERATIVE FINDINGS: Depressed tibial plateau fracture on the lateral side, with comminution, although there was not a lot of structural integrity loss along the column, just only at the very top, which is why used a short plate.  OPERATIVE PROCEDURE: The patient was brought to the operating room and placed in the supine position.  General anesthesia was administered.  IV antibiotics were given.  The right lower extremity was prepped and draped in usual sterile fashion.  Timeout performed.  The leg was elevated and exsanguinated and a tourniquet was inflated.  Straight incision was made along the lateral portion of the knee from above the joint line down along the tibial crest.  Dissection carried down and the IT band split.  Periosteum elevated off of the crest.  Dissection carried posteriorly to the level of the fibular head to allow access.  Sub-meniscal arthrotomy  carried out, the knee was drained of hemarthrosis.  The fracture was identified and cleaned.  I found the lateral exiting fracture line, it was just below the articular surface laterally, and did not involve the majority of the structural column.  I elevated the window, which was basically a flake of bone about 1 cm large at the proximal rim, and then elevated the articular segment which was fairly large, it was mostly the anterior half of the plateau.  I elevated this back to its anatomic key visualizing it within the joint.  I did use a total of 2 FiberWire sutures, size 2.0, through the meniscus for subsequent repair, as well as assisting with exposure during this portion of the case.  After elevation of the piece of bone, I placed cancellus bone chips below the articular segment, buttressing this appropriately.  I confirmed position with C-arm guidance, then closed a window, and applied a plate.  I had the top of the plate just opposed to the lateral plateau rim.  Confirmation of appropriate position was made with C-arm guidance as well as direct palpation.  I secured the plate proximally with a nonlocking screw and then distally with a nonlocking screw.  The plate was well adherent to the bone, and then I applied locking screws proximally, I used multidirectional screws because it appeared that after bringing the head of the plate down to the bone the screws were not parallel to the joint line any longer if I used the predetermined position, in fact we are aiming slightly distally, the one anterior most screw was aimed distally, I had good purchase with locking screws, and I used 1 screw in the inferior  row to augment the buttress fixation.  I secured the plate with a unicortical locking screw distally.  Excellent ultimate fixation achieved, anatomic alignment confirmed with C-arm as well as direct visualization, and I brought the FiberWire suture through the head at the plate, and then tied them over  securing the lateral meniscus.  Wounds were irrigated copiously, the IT band closed with Vicryl followed by Vicryl for subcutaneous tissue with Monocryl, Steri-Strips and routine closure for the skin.  Sterile gauze applied.  The wounds were injected.  Knee immobilizer was placed.  Jean Robinson was awakened and returned to the PACU in stable and satisfactory condition.  There were no complications and Jean Robinson tolerated the procedure well.  Jean Robinson will be nonweightbearing for a period of at least 8 weeks, and then 12 weeks to until full weightbearing.

## 2017-07-30 NOTE — Transfer of Care (Signed)
Immediate Anesthesia Transfer of Care Note  Patient: Jean Robinson  Procedure(s) Performed: OPEN REDUCTION INTERNAL FIXATION (ORIF) TIBIAL PLATEAU (Right Knee)  Patient Location: PACU  Anesthesia Type:General  Level of Consciousness: awake and sedated  Airway & Oxygen Therapy: Patient Spontanous Breathing and Patient connected to face mask oxygen  Post-op Assessment: Report given to RN and Post -op Vital signs reviewed and stable  Post vital signs: Reviewed and stable  Last Vitals:  Vitals Value Taken Time  BP 126/61 07/30/2017  3:30 PM  Temp    Pulse 91 07/30/2017  3:33 PM  Resp 16 07/30/2017  3:33 PM  SpO2 94 % 07/30/2017  3:33 PM  Vitals shown include unvalidated device data.  Last Pain:  Vitals:   07/30/17 1135  TempSrc: Oral  PainSc: 0-No pain         Complications: No apparent anesthesia complications

## 2017-07-30 NOTE — Anesthesia Postprocedure Evaluation (Signed)
Anesthesia Post Note  Patient: Jean Robinson  Procedure(s) Performed: OPEN REDUCTION INTERNAL FIXATION (ORIF) TIBIAL PLATEAU (Right Knee)     Patient location during evaluation: PACU Anesthesia Type: General Level of consciousness: awake and alert Pain management: pain level controlled Vital Signs Assessment: post-procedure vital signs reviewed and stable Respiratory status: spontaneous breathing, nonlabored ventilation, respiratory function stable and patient connected to nasal cannula oxygen Cardiovascular status: blood pressure returned to baseline and stable Postop Assessment: no apparent nausea or vomiting Anesthetic complications: no    Last Vitals:  Vitals:   07/30/17 1700 07/30/17 1715  BP: (!) 147/79 (!) 144/78  Pulse: 90 80  Resp: 15 16  Temp:  36.6 C  SpO2: 96% 97%    Last Pain:  Vitals:   07/30/17 1715  TempSrc: Oral  PainSc: 2                  Adell Koval S

## 2017-07-30 NOTE — Anesthesia Procedure Notes (Signed)
Procedure Name: LMA Insertion Performed by: Emery Binz W, CRNA Pre-anesthesia Checklist: Patient identified, Emergency Drugs available, Suction available and Patient being monitored Patient Re-evaluated:Patient Re-evaluated prior to induction Oxygen Delivery Method: Circle system utilized Preoxygenation: Pre-oxygenation with 100% oxygen Induction Type: IV induction Ventilation: Mask ventilation without difficulty LMA: LMA inserted LMA Size: 4.0 Number of attempts: 1 Placement Confirmation: positive ETCO2 Tube secured with: Tape Dental Injury: Teeth and Oropharynx as per pre-operative assessment        

## 2017-08-02 LAB — GLUCOSE, CAPILLARY: Glucose-Capillary: 166 mg/dL — ABNORMAL HIGH (ref 70–99)

## 2017-08-03 ENCOUNTER — Encounter (HOSPITAL_BASED_OUTPATIENT_CLINIC_OR_DEPARTMENT_OTHER): Payer: Self-pay | Admitting: Orthopedic Surgery

## 2017-09-01 ENCOUNTER — Other Ambulatory Visit: Payer: Self-pay | Admitting: Family Medicine

## 2017-09-03 ENCOUNTER — Ambulatory Visit: Payer: BLUE CROSS/BLUE SHIELD | Admitting: Podiatry

## 2017-09-09 ENCOUNTER — Other Ambulatory Visit: Payer: Self-pay | Admitting: Podiatry

## 2017-09-16 ENCOUNTER — Telehealth: Payer: Self-pay | Admitting: Family Medicine

## 2017-09-16 NOTE — Telephone Encounter (Unsigned)
Copied from [place] [id number]. Topic: Quick Communication - See Telephone Encounter >> Sep 16, 2017 11:24 AM Neva Seat wrote: Pt needing to get her A1C lab ordered.   Pt is also checking to see i if there is any other labs she needs done. Please call pt back to let her know and to schedule her labs.

## 2017-09-16 NOTE — Telephone Encounter (Signed)
Copied from [place] #[id number]. >> Sep 16, 2017 11:19 AM Neva Seat wrote: Pt needing to get her A1C done

## 2017-09-16 NOTE — Telephone Encounter (Signed)
Another request was sent today for this and was sent to [facility].

## 2017-09-16 NOTE — Telephone Encounter (Signed)
Pt last seen 04/30/17 and pt was to f/u in one month for DM and hypertension with 30' appt. 06/08/17 appt was cancelled by pt due to work schedule. Pt is requesting lab appt only.Please advise.

## 2017-09-17 NOTE — Telephone Encounter (Signed)
Needs appt with labs prior given poor control of DM and need for 6 month eval.

## 2017-09-17 NOTE — Telephone Encounter (Signed)
Lab appointment scheduled for 09/20/2017 at 9:15 am for labs and 10/01/2017 at 2:00 pm with Dr. Diona Browner.  Will forward message back to Dr. Diona Browner to put in future lab orders.

## 2017-09-19 ENCOUNTER — Telehealth: Payer: Self-pay | Admitting: Family Medicine

## 2017-09-19 DIAGNOSIS — E1165 Type 2 diabetes mellitus with hyperglycemia: Secondary | ICD-10-CM

## 2017-09-19 NOTE — Telephone Encounter (Signed)
-----   Message from [name], RT sent at 09/17/2017  3:35 PM EDT ----- Regarding: Lab orders for 09/20/17 Please enter lab orders for DM labs on 09/20/17.Jean Robinson

## 2017-09-20 ENCOUNTER — Other Ambulatory Visit (INDEPENDENT_AMBULATORY_CARE_PROVIDER_SITE_OTHER): Payer: BLUE CROSS/BLUE SHIELD

## 2017-09-20 DIAGNOSIS — E1165 Type 2 diabetes mellitus with hyperglycemia: Secondary | ICD-10-CM

## 2017-09-20 LAB — COMPREHENSIVE METABOLIC PANEL
ALT: 13 U/L (ref 0–35)
AST: 14 U/L (ref 0–37)
Albumin: 4.3 g/dL (ref 3.5–5.2)
Alkaline Phosphatase: 79 U/L (ref 39–117)
BUN: 14 mg/dL (ref 6–23)
CO2: 30 mEq/L (ref 19–32)
CREATININE: 0.8 mg/dL (ref 0.40–1.20)
Calcium: 11.1 mg/dL — ABNORMAL HIGH (ref 8.4–10.5)
Chloride: 104 mEq/L (ref 96–112)
GFR: 76.73 mL/min (ref >60.00)
GLUCOSE: 103 mg/dL — AB (ref 70–99)
Potassium: 4.3 mEq/L (ref 3.5–5.1)
Sodium: 141 mEq/L (ref 135–145)
Total Bilirubin: 0.7 mg/dL (ref 0.2–1.2)
Total Protein: 7.3 g/dL (ref 6.0–8.3)

## 2017-09-20 LAB — HEMOGLOBIN A1C: Hgb A1c MFr Bld: 8.3 % — ABNORMAL HIGH (ref 4.6–6.5)

## 2017-09-20 LAB — LIPID PANEL
CHOL/HDL RATIO: 3
Cholesterol: 140 mg/dL (ref 0–200)
HDL: 53.7 mg/dL (ref >39.00)
LDL Cholesterol: 66 mg/dL (ref 0–99)
NONHDL: 86.39
Triglycerides: 103 mg/dL (ref 0.0–149.0)
VLDL: 20.6 mg/dL (ref 0.0–40.0)

## 2017-10-01 ENCOUNTER — Ambulatory Visit (INDEPENDENT_AMBULATORY_CARE_PROVIDER_SITE_OTHER): Payer: BLUE CROSS/BLUE SHIELD | Admitting: Family Medicine

## 2017-10-01 ENCOUNTER — Encounter: Payer: Self-pay | Admitting: Family Medicine

## 2017-10-01 VITALS — BP 110/60 | HR 68 | Temp 98.4°F | Ht 61.0 in | Wt 172.5 lb

## 2017-10-01 DIAGNOSIS — I10 Essential (primary) hypertension: Secondary | ICD-10-CM | POA: Diagnosis not present

## 2017-10-01 DIAGNOSIS — R7989 Other specified abnormal findings of blood chemistry: Secondary | ICD-10-CM

## 2017-10-01 DIAGNOSIS — Z23 Encounter for immunization: Secondary | ICD-10-CM | POA: Diagnosis not present

## 2017-10-01 DIAGNOSIS — E785 Hyperlipidemia, unspecified: Secondary | ICD-10-CM | POA: Diagnosis not present

## 2017-10-01 DIAGNOSIS — E1165 Type 2 diabetes mellitus with hyperglycemia: Secondary | ICD-10-CM | POA: Diagnosis not present

## 2017-10-01 NOTE — Assessment & Plan Note (Signed)
Stay  On higher dose Levemir, continue healthy lifestyle

## 2017-10-01 NOTE — Patient Instructions (Addendum)
Do not take extra calcium for now.. We will re-eval at next OV.  Keep working on low carb diet and get back to exercise as tolerated.

## 2017-10-01 NOTE — Assessment & Plan Note (Signed)
?   Related to recent fracture and surgery.Marland Kitchen temporarily related. Will re-eval and consider serume PTH at next OV if still high

## 2017-10-01 NOTE — Progress Notes (Signed)
Subjective:    Patient ID: Jean Robinson, female    DOB: 20-Nov-1955, 62 y.o.   MRN: [id number]  HPI  62 year old female pt presents for DM follow up.   In 07/2017 she had an accidental tibial plateau fracture.. S/P fixation Dr Mardelle Matte.  Diabetes: improving control but not at goal on Levemir 45-50 Units and metformin max Lab Results  Component Value Date   HGBA1C 8.3 (H) 09/20/2017  Using medications without difficulties: Hypoglycemic episodes: NONE Hyperglycemic episodes: RARE Feet problems: no ulcers Blood Sugars averaging: FBS 90-120 eye exam within last year: 03/2017  she has lost weight  Even though she has not been as active. Wt Readings from Last 3 Encounters:  10/01/17 172 lb 8 oz (78.2 kg)  07/30/17 175 lb (79.4 kg)  04/30/17 176 lb 4 oz (79.9 kg)      Elevated Cholesterol:  At goal on  pravastatin Lab Results  Component Value Date   CHOL 140 09/20/2017   HDL 53.70 09/20/2017   LDLCALC 66 09/20/2017   TRIG 103.0 09/20/2017   CHOLHDL 3 09/20/2017  Using medications without problems: Muscle aches:  Diet compliance: great! Working on low carb diet. Exercise: none Other complaints:   High calcium.. She is not on supplement.. It has been slightly elevated since recent surgery.   Blood pressure 110/60, pulse 68, temperature 98.4 F (36.9 C), temperature source Oral, height 5\' 1"  (1.549 m), weight 172 lb 8 oz (78.2 kg), last menstrual period 02/02/2006. Social History /Family History/Past Medical History reviewed in detail and updated in EMR if needed.   Review of Systems  Constitutional: Negative for fatigue and fever.  HENT: Negative for ear pain.   Eyes: Negative for pain.  Respiratory: Negative for chest tightness and shortness of breath.   Cardiovascular: Negative for chest pain, palpitations and leg swelling.  Gastrointestinal: Negative for abdominal pain.  Genitourinary: Negative for dysuria.       Objective:   Physical Exam  Constitutional: Vital  signs are normal. She appears well-developed and well-nourished. She is cooperative.  Non-toxic appearance. She does not appear ill. No distress.  HENT:  Head: Normocephalic.  Right Ear: Hearing, tympanic membrane, external ear and ear canal normal. Tympanic membrane is not erythematous, not retracted and not bulging.  Left Ear: Hearing, tympanic membrane, external ear and ear canal normal. Tympanic membrane is not erythematous, not retracted and not bulging.  Nose: No mucosal edema or rhinorrhea. Right sinus exhibits no maxillary sinus tenderness and no frontal sinus tenderness. Left sinus exhibits no maxillary sinus tenderness and no frontal sinus tenderness.  Mouth/Throat: Uvula is midline, oropharynx is clear and moist and mucous membranes are normal.  Eyes: Pupils are equal, round, and reactive to light. Conjunctivae, EOM and lids are normal. Lids are everted and swept, no foreign bodies found.  Neck: Trachea normal and normal range of motion. Neck supple. Carotid bruit is not present. No thyroid mass and no thyromegaly present.  Cardiovascular: Normal rate, regular rhythm, S1 normal, S2 normal, normal heart sounds, intact distal pulses and normal pulses. Exam reveals no gallop and no friction rub.  No murmur heard. Pulmonary/Chest: Effort normal and breath sounds normal. No tachypnea. No respiratory distress. She has no decreased breath sounds. She has no wheezes. She has no rhonchi. She has no rales.  Abdominal: Soft. Normal appearance and bowel sounds are normal. There is no tenderness.  Neurological: She is alert.  Skin: Skin is warm, dry and intact. No rash noted.  Psychiatric:  Her speech is normal and behavior is normal. Judgment and thought content normal. Her mood appears not anxious. Cognition and memory are normal. She does not exhibit a depressed mood.   Right knee in brace/stabalizer  Diabetic foot exam: Normal inspection No skin breakdown No calluses  Normal DP pulses Normal  sensation to light touch and monofilament Nails normal      Assessment & Plan:

## 2017-10-01 NOTE — Assessment & Plan Note (Signed)
Good control on pravastatin. 

## 2017-10-01 NOTE — Addendum Note (Signed)
Addended by: Carter Kitten on: 10/01/2017 02:50 PM   Modules accepted: Orders

## 2017-10-01 NOTE — Assessment & Plan Note (Signed)
Well controlled. Continue current medication.  

## 2017-10-14 ENCOUNTER — Telehealth: Payer: Self-pay | Admitting: Radiology

## 2017-10-14 ENCOUNTER — Ambulatory Visit: Payer: BLUE CROSS/BLUE SHIELD

## 2017-10-14 NOTE — Telephone Encounter (Signed)
Patient came in to have a calcium done today. Your notes said to wait 3 months to re check. Her ortho said anything she had done with her fracture shouldn't have increased her level. Please advice patient when to have calcium done. She has a lab appt for Dec 3, 19.

## 2017-10-15 NOTE — Telephone Encounter (Signed)
Recheck at next lab OV Dec 3

## 2017-10-18 NOTE — Telephone Encounter (Signed)
Patient notified

## 2017-10-19 ENCOUNTER — Encounter: Payer: Self-pay | Admitting: Obstetrics and Gynecology

## 2017-11-01 ENCOUNTER — Other Ambulatory Visit: Payer: Self-pay

## 2017-11-01 ENCOUNTER — Encounter: Payer: Self-pay | Admitting: Obstetrics and Gynecology

## 2017-11-01 ENCOUNTER — Ambulatory Visit: Payer: BLUE CROSS/BLUE SHIELD | Admitting: Obstetrics and Gynecology

## 2017-11-01 VITALS — BP 120/78 | HR 87 | Ht 66.0 in | Wt 175.0 lb

## 2017-11-01 DIAGNOSIS — Z1211 Encounter for screening for malignant neoplasm of colon: Secondary | ICD-10-CM | POA: Diagnosis not present

## 2017-11-01 DIAGNOSIS — N812 Incomplete uterovaginal prolapse: Secondary | ICD-10-CM | POA: Diagnosis not present

## 2017-11-01 DIAGNOSIS — Z01419 Encounter for gynecological examination (general) (routine) without abnormal findings: Secondary | ICD-10-CM

## 2017-11-01 NOTE — Progress Notes (Signed)
62 y.o. G35P4004 Married Caucasian female here for annual exam.    Had tibial fracture.  Still getting back to normal activity.   Told her calcium was elevated.   Does not leak urine.  No leakage of stool. No difficulty passing stool.   Lifts up to 50 pounds at work.   She will need to find a new PCP due to her insurance.   PCP:  Dr Eliezer Lofts   Patient's last menstrual period was 02/02/2006.     Period Cycle (Days): (Postmenopausal 2008)     Sexually active: Yes.    The current method of family planning is post menopausal status.    Exercising: No.  The patient does not participate in regular exercise at present. Smoker:  no  Health Maintenance: Pap:  09/24/2015 normal History of abnormal Pap:  no MMG:  10/06/2017 birad category 1, negative Colonoscopy:  04/20/2007 - normal.  Repeat in 10 years.  BMD:   never  Result  n/a TDaP:  2008 Gardasil:   no HIV:08/09/2015 negative Hep C: 08/09/2015 negative Screening Labs:   PCP.   reports that she has never smoked. She has never used smokeless tobacco. She reports that she drinks alcohol. She reports that she does not use drugs.  Past Medical History:  Diagnosis Date  . Allergy   . Anxiety   . Closed fracture of lateral portion of right tibial plateau 07/30/2017  . Depression   . Diabetes mellitus   . H/O renal calculi 10/08/09  . Hyperlipidemia   . Hypertension   . Serum calcium elevated   . STD (sexually transmitted disease)    HSV I & II, IgG reflex positive  . Tibial plateau fracture, right     Past Surgical History:  Procedure Laterality Date  . APPENDECTOMY    . CHOLECYSTECTOMY    . ORIF TIBIA PLATEAU Right 07/30/2017   Procedure: OPEN REDUCTION INTERNAL FIXATION (ORIF) TIBIAL PLATEAU;  Surgeon: Marchia Bond, MD;  Location: [facility];  Service: Orthopedics;  Laterality: Right;  . TONSILLECTOMY      Current Outpatient Medications  Medication Sig Dispense Refill  . aspirin 81 MG tablet Take 81  mg by mouth daily.      . diclofenac sodium (VOLTAREN) 1 % GEL Apply 2 g topically 4 (four) times daily. Rub into affected area of foot 2 to 4 times daily 100 g 2  . glucose blood (ONETOUCH VERIO) test strip Use to check blood sugar twice daily.  Dx: E11.65 200 each 3  . Insulin Detemir (LEVEMIR FLEXTOUCH) 100 UNIT/ML Pen Inject 45 Units into the skin daily at 10 pm. If fasting blood sugar > 120 for 2 or 3 days in row,  increase insulin by 2 units.  15 ml = 30 day supply (Patient taking differently: Inject 50 Units into the skin daily at 10 pm. If fasting blood sugar > 120 for 2 or 3 days in row,  increase insulin by 2 units.  15 ml = 30 day supply) 15 pen 11  . Insulin Pen Needle (PEN NEEDLES) 31G X 8 MM MISC Use to inject Levemir daily 90 each 3  . Lancet Devices (ONE TOUCH DELICA LANCING DEV) MISC Check blood sugar twice a day and as directed. Dx E11.65 1 each 0  . latanoprost (XALATAN) 0.005 % ophthalmic solution Place 1 drop into both eyes at bedtime.     Marland Kitchen lisinopril (PRINIVIL,ZESTRIL) 40 MG tablet Take 1 tablet (40 mg total) by mouth daily. [place]  tablet 3  . metFORMIN (GLUCOPHAGE-XR) 500 MG 24 hr tablet TAKE TWO (2) TABLETS BY MOUTH 2 TIMES DAILY 360 tablet 3  . pravastatin (PRAVACHOL) 40 MG tablet TAKE 1 TABLET BY MOUTH EVERY DAY 90 tablet 1  . sertraline (ZOLOFT) 25 MG tablet TAKE 1 TABLET BY MOUTH EVERY DAY 90 tablet 1  . valACYclovir (VALTREX) 500 MG tablet Take 1 tablet (500 mg total) by mouth 2 (two) times daily. Take for 3 days for acute flare 60 tablet 6   No current facility-administered medications for this visit.     Family History  Problem Relation Age of Onset  . Depression Father   . Diabetes Father   . COPD Mother   . Diabetes Mother   . Macular degeneration Mother   . Diabetes Brother   . Cancer Other        liver  . Diabetes Other     Review of Systems  Constitutional: Negative.   HENT: Negative.   Eyes: Negative.   Respiratory: Negative.   Cardiovascular:  Negative.   Gastrointestinal: Negative.   Endocrine: Negative.   Genitourinary: Negative.   Musculoskeletal: Negative.   Skin: Negative.   Allergic/Immunologic: Negative.   Neurological: Negative.   Hematological: Negative.   Psychiatric/Behavioral: Negative.   All other systems reviewed and are negative.   Exam:   BP 120/78   Pulse 87   Ht 5\' 6"  (1.676 m)   Wt 175 lb (79.4 kg)   LMP 02/02/2006   BMI 28.25 kg/m     General appearance: alert, cooperative and appears stated age Head: Normocephalic, without obvious abnormality, atraumatic Neck: no adenopathy, supple, symmetrical, trachea midline and thyroid normal to inspection and palpation Lungs: clear to auscultation bilaterally Breasts: normal appearance, no masses or tenderness, No nipple retraction or dimpling, No nipple discharge or bleeding, No axillary or supraclavicular adenopathy Heart: regular rate and rhythm Abdomen: soft, non-tender; no masses, no organomegaly Extremities: extremities normal, atraumatic, no cyanosis or edema Skin: Skin color, texture, turgor normal. No rashes or lesions Lymph nodes: Cervical, supraclavicular, and axillary nodes normal. No abnormal inguinal nodes palpated Neurologic: Grossly normal  Pelvic: External genitalia:  no lesions              Urethra:  normal appearing urethra with no masses, tenderness or lesions              Bartholins and Skenes: normal                 Vagina: normal appearing vagina with normal color and discharge, no lesions.  Third degree cystocele, first degree uterine prolapse, and first degree rectocele.              Cervix: no lesions              Pap taken: No. Bimanual Exam:  Uterus:  normal size, contour, position, consistency, mobility, non-tender              Adnexa: no mass, fullness, tenderness              Rectal exam: Yes.  .  Confirms.              Anus:  normal sphincter tone, no lesions  Chaperone was present for exam.  Assessment:   Well woman  visit with normal exam. Hx HSV I and II.   DM.  Third degree cystocele, first degree uterine prolapse, first degree rectocele. Elevated calcium.  Plan: Mammogram screening. Recommended self breast awareness.  Pap and HR HPV as above. Guidelines for Calcium, Vitamin D, regular exercise program including cardiovascular and weight bearing exercise. Will check PTH and calcium.  Referral for colonoscopy.    Follow up annually and prn.   Additional counseling given.  Yes.  . ___15____ minutes face to face time of which over 50% was spent in counseling regarding uterovaginal prolapse.  I showed the patient her anatomy with a mirror. We discussed risk factors, signs, and symptoms of prolapse, and treatment options.  We discussed heavy lifting as a risk factor for prolapse.  Treatment may include physical therapy, pessary, or surgical care.  ACOG HOs on prolapse and surgical tx for prolapse to patient.  I clarified that today was an introductory visit to discuss the prolapse and that she may wish to return for an entire consultation to discuss this further.   She stated she may wish to bring her husband with her to that visit.   After visit summary provided.

## 2017-11-02 LAB — PTH, INTACT AND CALCIUM
CALCIUM: 11.1 mg/dL — AB (ref 8.7–10.3)
PTH: 37 pg/mL (ref 15–65)

## 2017-11-05 DIAGNOSIS — M25661 Stiffness of right knee, not elsewhere classified: Secondary | ICD-10-CM | POA: Diagnosis not present

## 2017-11-09 ENCOUNTER — Ambulatory Visit: Payer: 59 | Admitting: Family Medicine

## 2017-11-09 DIAGNOSIS — Z0289 Encounter for other administrative examinations: Secondary | ICD-10-CM

## 2017-11-18 ENCOUNTER — Ambulatory Visit (INDEPENDENT_AMBULATORY_CARE_PROVIDER_SITE_OTHER): Payer: 59 | Admitting: Family Medicine

## 2017-11-18 ENCOUNTER — Encounter: Payer: Self-pay | Admitting: Family Medicine

## 2017-11-18 VITALS — BP 130/70 | HR 85 | Temp 97.7°F | Ht 66.0 in | Wt 173.5 lb

## 2017-11-18 DIAGNOSIS — Z1211 Encounter for screening for malignant neoplasm of colon: Secondary | ICD-10-CM | POA: Diagnosis not present

## 2017-11-18 DIAGNOSIS — R7989 Other specified abnormal findings of blood chemistry: Secondary | ICD-10-CM | POA: Diagnosis not present

## 2017-11-18 NOTE — Progress Notes (Signed)
   Subjective:    Patient ID: Jean Robinson, female    DOB: February 01, 1956, 62 y.o.   MRN: [id number]  HPI  62 year old female presents for follow up hypercalcemia   Noted to have  Significantly elevated calcium at recent wellness check. 1 month ago 11.1 Looking back 3 years ago.. borderline 10.7, 10.6, then sometime back to 10.1,10.3  On recheck calcium is persistently elevated at 11.1  PTH was normal.   Ortho does not feel due to surgery.   She is not on calcium supplement or on vit D. Review of Systems  Constitutional: Negative for fatigue and fever.  HENT: Negative for congestion.   Eyes: Negative for pain.  Respiratory: Negative for cough and shortness of breath.   Cardiovascular: Negative for chest pain, palpitations and leg swelling.  Gastrointestinal: Negative for abdominal pain.  Genitourinary: Negative for dysuria and vaginal bleeding.  Musculoskeletal: Negative for back pain.  Neurological: Negative for syncope, light-headedness and headaches.  Psychiatric/Behavioral: Negative for dysphoric mood.       Objective:   Physical Exam  Constitutional: Vital signs are normal. She appears well-developed and well-nourished. She is cooperative.  Non-toxic appearance. She does not appear ill. No distress.  HENT:  Head: Normocephalic.  Right Ear: Hearing, tympanic membrane, external ear and ear canal normal. Tympanic membrane is not erythematous, not retracted and not bulging.  Left Ear: Hearing, tympanic membrane, external ear and ear canal normal. Tympanic membrane is not erythematous, not retracted and not bulging.  Nose: No mucosal edema or rhinorrhea. Right sinus exhibits no maxillary sinus tenderness and no frontal sinus tenderness. Left sinus exhibits no maxillary sinus tenderness and no frontal sinus tenderness.  Mouth/Throat: Uvula is midline, oropharynx is clear and moist and mucous membranes are normal.  Eyes: Pupils are equal, round, and reactive to light. Conjunctivae, EOM  and lids are normal. Lids are everted and swept, no foreign bodies found.  Neck: Trachea normal and normal range of motion. Neck supple. Carotid bruit is not present. No thyroid mass and no thyromegaly present.  Cardiovascular: Normal rate, regular rhythm, S1 normal, S2 normal, normal heart sounds, intact distal pulses and normal pulses. Exam reveals no gallop and no friction rub.  No murmur heard. Pulmonary/Chest: Effort normal and breath sounds normal. No tachypnea. No respiratory distress. She has no decreased breath sounds. She has no wheezes. She has no rhonchi. She has no rales.  Abdominal: Soft. Normal appearance and bowel sounds are normal. There is no tenderness.  Neurological: She is alert.  Skin: Skin is warm, dry and intact. No rash noted.  Psychiatric: Her speech is normal and behavior is normal. Judgment and thought content normal. Her mood appears not anxious. Cognition and memory are normal. She does not exhibit a depressed mood.          Assessment & Plan:

## 2017-11-18 NOTE — Assessment & Plan Note (Signed)
No clear cause. PTH nml.  Discussed need for further eval.. refer to ENDO for PTHr, vit d, additional testing etc.   No clear sign of malignancy.. Set up overdue colon cancer screening. Consider myeloma work up etc. Per ENDO recommendations.

## 2017-11-18 NOTE — Patient Instructions (Addendum)
Please stop at  have referrals made.

## 2017-12-08 ENCOUNTER — Other Ambulatory Visit: Payer: Self-pay

## 2017-12-08 ENCOUNTER — Emergency Department (HOSPITAL_COMMUNITY)
Admission: EM | Admit: 2017-12-08 | Discharge: 2017-12-08 | Disposition: A | Discharge status: Home / Self Care | Payer: 59 | Attending: Emergency Medicine | Admitting: Emergency Medicine

## 2017-12-08 ENCOUNTER — Encounter (HOSPITAL_COMMUNITY): Payer: Self-pay | Admitting: Emergency Medicine

## 2017-12-08 ENCOUNTER — Emergency Department (HOSPITAL_COMMUNITY): Payer: 59

## 2017-12-08 ENCOUNTER — Telehealth: Payer: Self-pay

## 2017-12-08 ENCOUNTER — Ambulatory Visit: Payer: 59 | Admitting: Family Medicine

## 2017-12-08 ENCOUNTER — Encounter: Payer: Self-pay | Admitting: Family Medicine

## 2017-12-08 VITALS — BP 116/64 | HR 127 | Temp 98.9°F | Ht 66.0 in | Wt 170.5 lb

## 2017-12-08 DIAGNOSIS — K449 Diaphragmatic hernia without obstruction or gangrene: Secondary | ICD-10-CM | POA: Diagnosis not present

## 2017-12-08 DIAGNOSIS — R5081 Fever presenting with conditions classified elsewhere: Secondary | ICD-10-CM

## 2017-12-08 DIAGNOSIS — Z7982 Long term (current) use of aspirin: Secondary | ICD-10-CM | POA: Insufficient documentation

## 2017-12-08 DIAGNOSIS — E119 Type 2 diabetes mellitus without complications: Secondary | ICD-10-CM | POA: Insufficient documentation

## 2017-12-08 DIAGNOSIS — M545 Low back pain, unspecified: Secondary | ICD-10-CM | POA: Insufficient documentation

## 2017-12-08 DIAGNOSIS — N12 Tubulo-interstitial nephritis, not specified as acute or chronic: Secondary | ICD-10-CM

## 2017-12-08 DIAGNOSIS — R509 Fever, unspecified: Secondary | ICD-10-CM

## 2017-12-08 DIAGNOSIS — R000 Tachycardia, unspecified: Secondary | ICD-10-CM | POA: Insufficient documentation

## 2017-12-08 DIAGNOSIS — I10 Essential (primary) hypertension: Secondary | ICD-10-CM | POA: Diagnosis not present

## 2017-12-08 DIAGNOSIS — R197 Diarrhea, unspecified: Secondary | ICD-10-CM | POA: Insufficient documentation

## 2017-12-08 DIAGNOSIS — Z79899 Other long term (current) drug therapy: Secondary | ICD-10-CM | POA: Diagnosis not present

## 2017-12-08 DIAGNOSIS — Z794 Long term (current) use of insulin: Secondary | ICD-10-CM | POA: Insufficient documentation

## 2017-12-08 LAB — URINALYSIS, ROUTINE W REFLEX MICROSCOPIC
BILIRUBIN URINE: NEGATIVE
Glucose, UA: 50 mg/dL — AB
KETONES UR: 20 mg/dL — AB
NITRITE: NEGATIVE
PH: 5 (ref 5.0–8.0)
Protein, ur: 30 mg/dL — AB
SPECIFIC GRAVITY, URINE: 1.012 (ref 1.005–1.030)

## 2017-12-08 LAB — COMPREHENSIVE METABOLIC PANEL
ALBUMIN: 3.2 g/dL — AB (ref 3.5–5.0)
ALK PHOS: 75 U/L (ref 39–117)
ALK PHOS: 70 U/L (ref 38–126)
ALT: 12 U/L (ref 0–35)
ALT: 13 U/L (ref 0–44)
ANION GAP: 12 (ref 5–15)
AST: 11 U/L (ref 0–37)
AST: 15 U/L (ref 15–41)
Albumin: 4 g/dL (ref 3.5–5.2)
BILIRUBIN TOTAL: 0.9 mg/dL (ref 0.2–1.2)
BUN: 14 mg/dL (ref 6–23)
BUN: 11 mg/dL (ref 8–23)
CALCIUM: 9.5 mg/dL (ref 8.9–10.3)
CO2: 25 meq/L (ref 19–32)
CO2: 22 mmol/L (ref 22–32)
Calcium: 10.3 mg/dL (ref 8.4–10.5)
Chloride: 97 mEq/L (ref 96–112)
Chloride: 97 mmol/L — ABNORMAL LOW (ref 98–111)
Creatinine, Ser: 0.88 mg/dL (ref 0.40–1.20)
Creatinine, Ser: 0.91 mg/dL (ref 0.44–1.00)
GFR calc Af Amer: >60 mL/min (ref >60)
GFR: 68.69 mL/min (ref >60.00)
GLUCOSE: 368 mg/dL — AB (ref 70–99)
GLUCOSE: 254 mg/dL — AB (ref 70–99)
POTASSIUM: 3.7 mmol/L (ref 3.5–5.1)
Potassium: 4.3 mEq/L (ref 3.5–5.1)
Sodium: 132 mEq/L — ABNORMAL LOW (ref 135–145)
Sodium: 131 mmol/L — ABNORMAL LOW (ref 135–145)
TOTAL PROTEIN: 7.4 g/dL (ref 6.0–8.3)
TOTAL PROTEIN: 6.6 g/dL (ref 6.5–8.1)
Total Bilirubin: 0.9 mg/dL (ref 0.3–1.2)

## 2017-12-08 LAB — CBC WITH DIFFERENTIAL/PLATELET
BASOS ABS: 0.1 10*3/uL (ref 0.0–0.1)
Basophils Relative: 0.4 % (ref 0.0–3.0)
EOS ABS: 0 10*3/uL (ref 0.0–0.7)
Eosinophils Relative: 0 % (ref 0.0–5.0)
HCT: 39 % (ref 36.0–46.0)
Hemoglobin: 13.1 g/dL (ref 12.0–15.0)
LYMPHS ABS: 1.7 10*3/uL (ref 0.7–4.0)
Lymphocytes Relative: 12.4 % (ref 12.0–46.0)
MCHC: 33.5 g/dL (ref 30.0–36.0)
MCV: 95.4 fl (ref 78.0–100.0)
MONO ABS: 0.9 10*3/uL (ref 0.1–1.0)
MONOS PCT: 6.3 % (ref 3.0–12.0)
NEUTROS ABS: 11.3 10*3/uL — AB (ref 1.4–7.7)
NEUTROS PCT: 80.9 % — AB (ref 43.0–77.0)
PLATELETS: 253 10*3/uL (ref 150.0–400.0)
RBC: 4.09 Mil/uL (ref 3.87–5.11)
RDW: 12.4 % (ref 11.5–15.5)
WBC: 14 10*3/uL — ABNORMAL HIGH (ref 4.0–10.5)

## 2017-12-08 LAB — CBC
HCT: 38.3 % (ref 36.0–46.0)
Hemoglobin: 12.6 g/dL (ref 12.0–15.0)
MCH: 31.2 pg (ref 26.0–34.0)
MCHC: 32.9 g/dL (ref 30.0–36.0)
MCV: 94.8 fL (ref 80.0–100.0)
Platelets: 250 10*3/uL (ref 150–400)
RBC: 4.04 MIL/uL (ref 3.87–5.11)
RDW: 11.6 % (ref 11.5–15.5)
WBC: 12.8 10*3/uL — AB (ref 4.0–10.5)
nRBC: 0 % (ref 0.0–0.2)

## 2017-12-08 LAB — POC URINALSYSI DIPSTICK (AUTOMATED)
BILIRUBIN UA: NEGATIVE
Glucose, UA: POSITIVE — AB
Nitrite, UA: NEGATIVE
PH UA: 6 (ref 5.0–8.0)
PROTEIN UA: POSITIVE — AB
SPEC GRAV UA: 1.02 (ref 1.010–1.025)
Urobilinogen, UA: 0.2 E.U./dL

## 2017-12-08 LAB — LIPASE, BLOOD: Lipase: 28 U/L (ref 11–51)

## 2017-12-08 MED ORDER — ACETAMINOPHEN 500 MG PO TABS
1000.0000 mg | ORAL_TABLET | Freq: Once | ORAL | Status: AC
  Administered 2017-12-08: 1000 mg via ORAL
  Filled 2017-12-08: qty 2

## 2017-12-08 MED ORDER — SODIUM CHLORIDE 0.9 % IV SOLN
2.0000 g | Freq: Once | INTRAVENOUS | Status: AC
  Administered 2017-12-08: 2 g via INTRAVENOUS
  Filled 2017-12-08: qty 20

## 2017-12-08 MED ORDER — CEPHALEXIN 500 MG PO CAPS: 500.0000 mg | ORAL_CAPSULE | Freq: Four times a day (QID) | ORAL | 0 refills | Status: DC

## 2017-12-08 MED ORDER — IOHEXOL 300 MG/ML  SOLN
100.0000 mL | Freq: Once | INTRAMUSCULAR | Status: AC | PRN
  Administered 2017-12-08: 100 mL via INTRAVENOUS

## 2017-12-08 MED ORDER — ONDANSETRON 4 MG PO TBDP: 4.0000 mg | ORAL_TABLET | Freq: Three times a day (TID) | ORAL | 0 refills | Status: DC | PRN

## 2017-12-08 MED ORDER — SODIUM CHLORIDE 0.9 % IV BOLUS
1000.0000 mL | Freq: Once | INTRAVENOUS | Status: AC
  Administered 2017-12-08: 1000 mL via INTRAVENOUS

## 2017-12-08 NOTE — Patient Instructions (Signed)
Drink lots of water and other fluids- at least 64 oz per day  Alert [place] if diarrhea worsens or fever worsens Continue acetaminophen  Lab today  Urinalysis today   Plan to follow

## 2017-12-08 NOTE — Progress Notes (Signed)
Subjective:    Patient ID: Jean Robinson, female    DOB: March 28, 1955, 62 y.o.   MRN: [id number]  HPI Here with fever/ body aches and chills with GI and back c/o   Traveled sat (ate the same as husband-he did not get sick) Came home sat night-abd cramping-worsened the next day (low abd both sides)  Tried to go to work Sunday  No n/v Some diarrhea (not severe)  Took tums to settle stomach Monday- eased GI , but low grade fever and aching  Then severe chills that night  Temp up to 103    No work past 2 days   Temp this am - 101  achey (all over) - but most painful in low back  No blood in urine  Urethra was a little tender / has HSV (did take valcyclovir) - does not flare often  Does not feel like a kidney stone -has had them before  Drinking water  Low appetite-eating a little  No dysuria  No frequency or urgency     No uri symptoms at all   Tick bites -none  Rash-none   Takes acetaminophen 500 mg - 1-2 up to every 6 h This am just took one    Wt Readings from Last 3 Encounters:  12/08/17 170 lb 8 oz (77.3 kg)  11/18/17 173 lb 8 oz (78.7 kg)  11/01/17 175 lb (79.4 kg)   27.52 kg/m   Temp: 98.9 F (37.2 C)  Took tylenol 2 hours ago   GI issues this weekend- improved   Low back pain   Pulse high-not light headed Pulse Readings from Last 3 Encounters:  12/08/17 (!) 127  11/18/17 85  11/01/17 87  HTN controlled BP Readings from Last 3 Encounters:  12/08/17 116/64  11/18/17 130/70  11/01/17 120/78  has DM Lab Results  Component Value Date   HGBA1C 8.3 (H) 09/20/2017   Stool is soft but not watery (on and off)     Patient Active Problem List   Diagnosis Date Noted  . Tachycardia 12/08/2017  . Fever 12/08/2017  . Low back pain 12/08/2017  . Diarrhea 12/08/2017  . High serum calcium 10/01/2017  . Closed fracture of lateral portion of right tibial plateau 07/30/2017  . OA (osteoarthritis) of finger 03/05/2017  . Trigger finger, right middle finger  08/11/2016  . Hematuria 10/10/2015  . Genital herpes 10/10/2015  . Diabetes type 2, uncontrolled ([place])   . DEPRESSION, MILD 02/27/2008  . Hyperlipidemia LDL goal <100 11/18/2006  . Essential hypertension, benign 11/18/2006  . ALLERGIC RHINITIS 11/18/2006   Past Medical History:  Diagnosis Date  . Allergy   . Anxiety   . Closed fracture of lateral portion of right tibial plateau 07/30/2017  . Depression   . Diabetes mellitus   . H/O renal calculi 10/08/09  . Hyperlipidemia   . Hypertension   . Serum calcium elevated   . STD (sexually transmitted disease)    HSV I & II, IgG reflex positive  . Tibial plateau fracture, right    Past Surgical History:  Procedure Laterality Date  . APPENDECTOMY    . CHOLECYSTECTOMY    . ORIF TIBIA PLATEAU Right 07/30/2017   Procedure: OPEN REDUCTION INTERNAL FIXATION (ORIF) TIBIAL PLATEAU;  Surgeon: Marchia Bond, MD;  Location: [facility];  Service: Orthopedics;  Laterality: Right;  . TONSILLECTOMY     Social History   Tobacco Use  . Smoking status: Never Smoker  . Smokeless tobacco: Never  Used  Substance Use Topics  . Alcohol use: Yes    Alcohol/week: 0.0 - 2.0 standard drinks    Comment: social  . Drug use: No   Family History  Problem Relation Age of Onset  . Depression Father   . Diabetes Father   . COPD Mother   . Diabetes Mother   . Macular degeneration Mother   . Diabetes Brother   . Cancer Other        liver  . Diabetes Other    No Known Allergies No current facility-administered medications on file prior to visit.    Current Outpatient Medications on File Prior to Visit  Medication Sig Dispense Refill  . aspirin 81 MG tablet Take 81 mg by mouth daily.      . diclofenac sodium (VOLTAREN) 1 % GEL Apply 2 g topically 4 (four) times daily. Rub into affected area of foot 2 to 4 times daily 100 g 2  . glucose blood (ONETOUCH VERIO) test strip Use to check blood sugar twice daily.  Dx: E11.65 200 each 3  .  Insulin Detemir (LEVEMIR FLEXTOUCH) 100 UNIT/ML Pen Inject 45 Units into the skin daily at 10 pm. If fasting blood sugar > 120 for 2 or 3 days in row,  increase insulin by 2 units.  15 ml = 30 day supply (Patient taking differently: Inject 50 Units into the skin daily at 10 pm. If fasting blood sugar > 120 for 2 or 3 days in row,  increase insulin by 2 units.  15 ml = 30 day supply) 15 pen 11  . Insulin Pen Needle (PEN NEEDLES) 31G X 8 MM MISC Use to inject Levemir daily 90 each 3  . Lancet Devices (ONE TOUCH DELICA LANCING DEV) MISC Check blood sugar twice a day and as directed. Dx E11.65 1 each 0  . latanoprost (XALATAN) 0.005 % ophthalmic solution Place 1 drop into both eyes at bedtime.     Marland Kitchen lisinopril (PRINIVIL,ZESTRIL) 40 MG tablet Take 1 tablet (40 mg total) by mouth daily. 90 tablet 3  . metFORMIN (GLUCOPHAGE-XR) 500 MG 24 hr tablet TAKE TWO (2) TABLETS BY MOUTH 2 TIMES DAILY 360 tablet 3  . pravastatin (PRAVACHOL) 40 MG tablet TAKE 1 TABLET BY MOUTH EVERY DAY 90 tablet 1  . sertraline (ZOLOFT) 25 MG tablet TAKE 1 TABLET BY MOUTH EVERY DAY 90 tablet 1  . valACYclovir (VALTREX) 500 MG tablet Take 1 tablet (500 mg total) by mouth 2 (two) times daily. Take for 3 days for acute flare 60 tablet 6    Review of Systems  Constitutional: Positive for appetite change, fatigue and fever. Negative for activity change and unexpected weight change.  HENT: Negative for congestion, ear pain, rhinorrhea, sinus pressure, sore throat and voice change.   Eyes: Negative for pain, redness and visual disturbance.  Respiratory: Negative for cough, shortness of breath and wheezing.   Cardiovascular: Negative for chest pain, palpitations and leg swelling.  Gastrointestinal: Positive for abdominal pain and diarrhea. Negative for abdominal distention, anal bleeding, blood in stool, constipation, nausea, rectal pain and vomiting.  Endocrine: Negative for polydipsia and polyuria.  Genitourinary: Negative for dysuria,  frequency and urgency.  Musculoskeletal: Positive for back pain. Negative for arthralgias and myalgias.       No joint swelling   Skin: Negative for pallor and rash.  Allergic/Immunologic: Negative for environmental allergies.  Neurological: Negative for dizziness, syncope and headaches.  Hematological: Negative for adenopathy. Does not bruise/bleed easily.  Psychiatric/Behavioral: Negative  for decreased concentration and dysphoric mood. The patient is not nervous/anxious.        Objective:   Physical Exam  Constitutional: She appears well-developed and well-nourished. No distress.  Well but fatigued appearing   HENT:  Head: Normocephalic and atraumatic.  Right Ear: External ear normal.  Left Ear: External ear normal.  Nose: Nose normal.  Mouth/Throat: Oropharynx is clear and moist.  Nares are boggy but clear No sinus tenderness  Eyes: Pupils are equal, round, and reactive to light. Conjunctivae and EOM are normal. Right eye exhibits no discharge. Left eye exhibits no discharge. No scleral icterus.  Neck: Normal range of motion. Neck supple.  Cardiovascular: Regular rhythm and normal heart sounds.  Tachycardia   Pulmonary/Chest: Effort normal and breath sounds normal. No stridor. No respiratory distress. She has no wheezes. She has no rales. She exhibits no tenderness.  Abdominal: Soft. Bowel sounds are normal. She exhibits no distension, no pulsatile midline mass and no mass. There is tenderness in the epigastric area. There is no rigidity, no rebound, no guarding, no CVA tenderness, no tenderness at McBurney's point and negative Murphy's sign. No hernia.  Mild epigastric tenderness to deep palpation w/o rebound or guarding   Musculoskeletal: Normal range of motion.  Lymphadenopathy:    She has no cervical adenopathy.  Neurological: She is alert. No cranial nerve deficit. Coordination normal.  Skin: Skin is warm and dry. Capillary refill takes 2 to 3 seconds. No rash noted. No  erythema. No pallor.  Psychiatric: She has a normal mood and affect.          Assessment & Plan:   Problem List Items Addressed This Visit      Other   Diarrhea    Mild with fever/chills Eating bland  Lab incl cbc/cmet today Watch for abd pain or n/v      Relevant Orders   CBC with Differential/Platelet (Completed)   Comprehensive metabolic panel (Completed)   Fever - Primary    With vague GI symptoms and back pain  UA -pending /could not give sample and will bring one back Lab today  Disc red flags to watch for -abd pain/ n/v or inability to hydrate or inc pulse      Relevant Orders   CBC with Differential/Platelet (Completed)   Comprehensive metabolic panel (Completed)   Low back pain    With fever/malaise Pend ua -could not give sample here  Reassuring exam      Relevant Orders   Urine Culture   POCT Urinalysis Dipstick (Automated) (Completed)   Tachycardia    Suspect due to dehydration with febrile illness and diarrhea  Disc oral re hydration  Lab today  Watch closely

## 2017-12-08 NOTE — Telephone Encounter (Signed)
Pt was seen this morning. She said her fever has broken for now, but she had an episode of vomiting about 10 minutes ago. Her heart rate is 135, but lower than it has been. She is asking if there is anything else she needs to do. The biggest issue is shaking/shivering "like never before". Not sure if she needs to continue with Tylenol, ibuprofen or alternate the 2. She said you can send a MyChart, also.

## 2017-12-08 NOTE — ED Notes (Signed)
Pt c/o abd pain for a few days  No n or v.  Alert no distress

## 2017-12-08 NOTE — Assessment & Plan Note (Signed)
Suspect due to dehydration with febrile illness and diarrhea  Disc oral re hydration  Lab today  Watch closely

## 2017-12-08 NOTE — Assessment & Plan Note (Signed)
Mild with fever/chills Eating bland  Lab incl cbc/cmet today Watch for abd pain or n/v

## 2017-12-08 NOTE — Telephone Encounter (Signed)
Not thrilled with that heart rate and vomiting  In addition labs came back and her wbc is high at 14 (indicating likely bacterial infection)-this worries me as I do not know where it is  I want her to go to the ED (sorry)- for eval / IVF and more attention in terms of diagnosis - may need abx but not sure  Let me know where she goes and I will alert Dr Diona Browner

## 2017-12-08 NOTE — Telephone Encounter (Signed)
Notified patient and she will be going to [facility] ER this pm.  She thanks [place] for the call and attention.

## 2017-12-08 NOTE — Assessment & Plan Note (Signed)
With vague GI symptoms and back pain  UA -pending /could not give sample and will bring one back Lab today  Disc red flags to watch for -abd pain/ n/v or inability to hydrate or inc pulse

## 2017-12-08 NOTE — Assessment & Plan Note (Signed)
With fever/malaise Pend ua -could not give sample here  Reassuring exam

## 2017-12-08 NOTE — ED Notes (Signed)
Pt returned from ct

## 2017-12-08 NOTE — ED Triage Notes (Signed)
Pt c/o lower abdominal pain with nausea and diarrhea since Saturday. Denies urinary symptoms. Reports intermittent fevers. Sent by PCP for elevated WBC.

## 2017-12-08 NOTE — Telephone Encounter (Signed)
Good looks like she is in the ED now

## 2017-12-08 NOTE — ED Notes (Signed)
Never DC by primary RN 

## 2017-12-08 NOTE — ED Provider Notes (Signed)
[facility] EMERGENCY DEPARTMENT Provider Note   CSN: [id number] Arrival date & time: 12/08/17  1907     History   Chief Complaint Chief Complaint  Patient presents with  . Abdominal Pain    HPI Jean Robinson is a 62 y.o. female.  Pt presents to the ED today with abdominal pain and fever.  Pt said she has not felt well since Saturday, 11/2.  She said it started with diarrhea.  She and her husband ate the same food the night before and he was fine.  She has had abdominal cramping and fever since then.  The pt went to her pcp and labs were drawn.  The pt said her wbc was elevated, so her doctor told her to come in.  The pt last took tylenol at 1000.     Past Medical History:  Diagnosis Date  . Allergy   . Anxiety   . Closed fracture of lateral portion of right tibial plateau 07/30/2017  . Depression   . Diabetes mellitus   . H/O renal calculi 10/08/09  . Hyperlipidemia   . Hypertension   . Serum calcium elevated   . STD (sexually transmitted disease)    HSV I & II, IgG reflex positive  . Tibial plateau fracture, right     Patient Active Problem List   Diagnosis Date Noted  . Tachycardia 12/08/2017  . Fever 12/08/2017  . Low back pain 12/08/2017  . Diarrhea 12/08/2017  . High serum calcium 10/01/2017  . Closed fracture of lateral portion of right tibial plateau 07/30/2017  . OA (osteoarthritis) of finger 03/05/2017  . Trigger finger, right middle finger 08/11/2016  . Genital herpes 10/10/2015  . Diabetes type 2, uncontrolled ([place])   . DEPRESSION, MILD 02/27/2008  . Hyperlipidemia LDL goal <100 11/18/2006  . Essential hypertension, benign 11/18/2006  . ALLERGIC RHINITIS 11/18/2006    Past Surgical History:  Procedure Laterality Date  . APPENDECTOMY    . CHOLECYSTECTOMY    . ORIF TIBIA PLATEAU Right 07/30/2017   Procedure: OPEN REDUCTION INTERNAL FIXATION (ORIF) TIBIAL PLATEAU;  Surgeon: Marchia Bond, MD;  Location: [place];   Service: Orthopedics;  Laterality: Right;  . TONSILLECTOMY       OB History    Gravida  4   Para  4   Term  4   Preterm  0   AB  0   Living  4     SAB  0   TAB  0   Ectopic  0   Multiple  0   Live Births  4            Home Medications    Prior to Admission medications   Medication Sig Start Date End Date Taking? Authorizing Provider  aspirin 81 MG tablet Take 81 mg by mouth daily.     Yes [provider]  Insulin Detemir (LEVEMIR FLEXTOUCH) 100 UNIT/ML Pen Inject 45 Units into the skin daily at 10 pm. If fasting blood sugar > 120 for 2 or 3 days in row,  increase insulin by 2 units.  15 ml = 30 day supply Patient taking differently: Inject 45 Units into the skin daily at 10 pm.  05/03/17  Yes Bedsole, Amy E, MD  latanoprost (XALATAN) 0.005 % ophthalmic solution Place 1 drop into both eyes at bedtime.  07/26/15  Yes [provider]  lisinopril (PRINIVIL,ZESTRIL) 40 MG tablet Take 1 tablet (40 mg total) by mouth daily. 03/08/17  Yes Elby Beck, FNP  metFORMIN (GLUCOPHAGE-XR) 500 MG 24 hr tablet TAKE TWO (2) TABLETS BY MOUTH 2 TIMES DAILY Patient taking differently: Take 1,000 mg by mouth 2 (two) times daily.  04/30/17  Yes Bedsole, Amy E, MD  pravastatin (PRAVACHOL) 40 MG tablet TAKE 1 TABLET BY MOUTH EVERY DAY Patient taking differently: Take 40 mg by mouth daily.  09/01/17  Yes Bedsole, Amy E, MD  sertraline (ZOLOFT) 25 MG tablet TAKE 1 TABLET BY MOUTH EVERY DAY Patient taking differently: Take 25 mg by mouth daily.  09/01/17  Yes Bedsole, Amy E, MD  valACYclovir (VALTREX) 500 MG tablet Take 1 tablet (500 mg total) by mouth 2 (two) times daily. Take for 3 days for acute flare 01/06/17  Yes Regina Eck, CNM  cephALEXin (KEFLEX) 500 MG capsule Take 1 capsule (500 mg total) by mouth 4 (four) times daily. 12/08/17   Isla Pence, MD  diclofenac sodium (VOLTAREN) 1 % GEL Apply 2 g topically 4 (four) times daily. Rub into affected area of foot 2  to 4 times daily Patient not taking: Reported on 12/08/2017 06/08/17   Trula Slade, DPM  glucose blood (ONETOUCH VERIO) test strip Use to check blood sugar twice daily.  Dx: E11.65 05/31/17   Jinny Sanders, MD  Insulin Pen Needle (PEN NEEDLES) 31G X 8 MM MISC Use to inject Levemir daily 03/22/17   Jinny Sanders, MD  Lancet Devices (ONE TOUCH DELICA LANCING DEV) MISC Check blood sugar twice a day and as directed. Dx E11.65 05/13/16   Jinny Sanders, MD  ondansetron (ZOFRAN ODT) 4 MG disintegrating tablet Take 1 tablet (4 mg total) by mouth every 8 (eight) hours as needed. 12/08/17   Isla Pence, MD    Family History Family History  Problem Relation Age of Onset  . Depression Father   . Diabetes Father   . COPD Mother   . Diabetes Mother   . Macular degeneration Mother   . Diabetes Brother   . Cancer Other        liver  . Diabetes Other     Social History Social History   Tobacco Use  . Smoking status: Never Smoker  . Smokeless tobacco: Never Used  Substance Use Topics  . Alcohol use: Yes    Alcohol/week: 0.0 - 2.0 standard drinks    Comment: social  . Drug use: No     Allergies   Patient has no known allergies.   Review of Systems Review of Systems  Constitutional: Positive for fever.  Gastrointestinal: Positive for abdominal pain, diarrhea and nausea.  All other systems reviewed and are negative.    Physical Exam Updated Vital Signs BP 113/66   Pulse 100   Temp 100.2 F (37.9 C) (Oral)   Resp (!) 26   Ht 5\' 6"  (1.676 m)   Wt 77.1 kg   LMP 02/02/2006   SpO2 93%   BMI 27.44 kg/m   Physical Exam  Constitutional: She is oriented to person, place, and time. She appears well-developed and well-nourished.  HENT:  Head: Normocephalic and atraumatic.  Mouth/Throat: Mucous membranes are dry.  Eyes: Pupils are equal, round, and reactive to light. EOM are normal.  Cardiovascular: Regular rhythm, normal heart sounds and intact distal pulses. Tachycardia  present.  Pulmonary/Chest: Effort normal and breath sounds normal.  Abdominal: Normal appearance. There is tenderness in the left lower quadrant.  Neurological: She is alert and oriented to person, place, and time.  Skin: Skin  is warm and dry. Capillary refill takes less than 2 seconds.  Psychiatric: She has a normal mood and affect. Her behavior is normal.  Vitals reviewed.    ED Treatments / Results  Labs (all labs ordered are listed, but only abnormal results are displayed) Labs Reviewed  COMPREHENSIVE METABOLIC PANEL - Abnormal; Notable for the following components:      Result Value   Sodium 131 (*)    Chloride 97 (*)    Glucose, Bld 254 (*)    Albumin 3.2 (*)    All other components within normal limits  CBC - Abnormal; Notable for the following components:   WBC 12.8 (*)    All other components within normal limits  URINALYSIS, ROUTINE W REFLEX MICROSCOPIC - Abnormal; Notable for the following components:   Glucose, UA 50 (*)    Hgb urine dipstick MODERATE (*)    Ketones, ur 20 (*)    Protein, ur 30 (*)    Leukocytes, UA MODERATE (*)    Bacteria, UA RARE (*)    All other components within normal limits  URINE CULTURE  LIPASE, BLOOD    EKG None  Radiology Ct Abdomen Pelvis W Contrast  Result Date: 12/08/2017 CLINICAL DATA:  Abdominal pain for a few days. EXAM: CT ABDOMEN AND PELVIS WITH CONTRAST TECHNIQUE: Multidetector CT imaging of the abdomen and pelvis was performed using the standard protocol following bolus administration of intravenous contrast. CONTRAST:  124mL OMNIPAQUE IOHEXOL 300 MG/ML  SOLN COMPARISON:  09/19/2015 FINDINGS: Lower chest: Atelectasis in the lung bases. Small esophageal hiatal hernia. Hepatobiliary: Surgical absence of the gallbladder. Extrahepatic bile duct dilatation is likely postoperative. No common duct stones identified. Diffuse fatty infiltration of the liver. Pancreas: Calcifications in the head of the pancreas probably reflect chronic  pancreatitis. Mild edema around the tail of the pancreas and in the left anterior pararenal space may indicate changes of acute pancreatitis. No pancreatic ductal dilatation or mass identified. Spleen: Normal in size without focal abnormality. Adrenals/Urinary Tract: No adrenal gland nodules. Bilateral renal parenchymal cysts. Heterogeneous left renal nephrogram with stranding in the pararenal fat likely representing pyelonephritis. No hydronephrosis or hydroureter. Right kidney and bladder are unremarkable. Stomach/Bowel: Stomach is within normal limits. Appendix appears surgically absent. No evidence of bowel wall thickening, distention, or inflammatory changes. Vascular/Lymphatic: Aortic atherosclerosis. No enlarged abdominal or pelvic lymph nodes. Reproductive: Uterus and bilateral adnexa are unremarkable. Other: Fat in the inguinal canals bilaterally. No free air or free fluid in the abdomen. Musculoskeletal: Degenerative changes in the spine. No destructive bone lesions. IMPRESSION: 1. Inflammatory stranding in the left upper quadrant may represent pancreatitis and/or pyelonephritis. 2. Heterogeneous left renal nephrogram is consistent with pyelonephritis. 3. Calcifications in the pancreas suggest prior chronic pancreatitis. 4. Surgical absence of the gallbladder with bile duct dilatation, likely postoperative. Electronically Signed   By: Lucienne Capers M.D.   On: 12/08/2017 22:14    Procedures Procedures (including critical care time)  Medications Ordered in ED Medications  sodium chloride 0.9 % bolus 1,000 mL (1,000 mLs Intravenous New Bag/Given 12/08/17 2231)  cefTRIAXone (ROCEPHIN) 2 g in sodium chloride 0.9 % 100 mL IVPB (2 g Intravenous New Bag/Given 12/08/17 2232)  acetaminophen (TYLENOL) tablet 1,000 mg (1,000 mg Oral Given 12/08/17 2104)  iohexol (OMNIPAQUE) 300 MG/ML solution 100 mL (100 mLs Intravenous Contrast Given 12/08/17 2158)     Initial Impression / Assessment and Plan / ED  Course  I have reviewed the triage vital signs and the nursing notes.  Pertinent labs & imaging results that were available during my care of the patient were reviewed by me and considered in my medical decision making (see chart for details).    Pt is feeling much better.  She was given IVFs and tylenol for her fever.  She is given rocephin 2g IV.  She will be d/c on keflex.  Return if worse.  F/u with pcp.  Final Clinical Impressions(s) / ED Diagnoses   Final diagnoses:  Pyelonephritis  Fever in adult    ED Discharge Orders         Ordered    cephALEXin (KEFLEX) 500 MG capsule  4 times daily     12/08/17 2248    ondansetron (ZOFRAN ODT) 4 MG disintegrating tablet  Every 8 hours PRN     12/08/17 2248           Isla Pence, MD 12/08/17 2251

## 2017-12-09 NOTE — Telephone Encounter (Signed)
Noted. Agree with ED.

## 2017-12-10 LAB — URINE CULTURE
MICRO NUMBER:: 91336775
SPECIMEN QUALITY:: ADEQUATE

## 2017-12-11 LAB — URINE CULTURE: Culture: 60000 — AB

## 2017-12-12 ENCOUNTER — Telehealth: Payer: Self-pay

## 2017-12-12 NOTE — Telephone Encounter (Signed)
Post ED Visit - Positive Culture Follow-up  Culture report reviewed by antimicrobial stewardship pharmacist:  []  Elenor Quinones, Pharm.D. []  Heide Guile, Pharm.D., BCPS AQ-ID []  Parks Neptune, Pharm.D., BCPS []  Alycia Rossetti, Pharm.D., BCPS []  [place], Pharm.D., BCPS, AAHIVP []  Legrand Como, Pharm.D., BCPS, AAHIVP [x]  Salome Arnt, PharmD, BCPS []  Johnnette Gourd, PharmD, BCPS []  Hughes Better, PharmD, BCPS []  Leeroy Cha, PharmD  Positive urine culture Treated with Cephalexin, organism sensitive to the same and no further patient follow-up is required at this time.  Genia Del 12/12/2017, 10:01 AM

## 2017-12-22 ENCOUNTER — Telehealth: Payer: Self-pay | Admitting: Family Medicine

## 2017-12-22 NOTE — Telephone Encounter (Signed)
Left message asking pt to call office please r/s 12/6 appointment with dr Diona Browner.  Provider out of office

## 2017-12-27 ENCOUNTER — Telehealth: Payer: Self-pay | Admitting: Family Medicine

## 2017-12-27 NOTE — Telephone Encounter (Signed)
Pt called to set up appt for HFU per MyChart msg. There is not a 30 min appt this week. Is it ok to schedule with Dr Glori Bickers, or pt is avail after 12noon Tues 11/26 if you are able to add on. She is better but still has some symptoms and wants to be sure everything is OK.

## 2017-12-27 NOTE — Telephone Encounter (Signed)
Optimally she would see her PCP but if that is not possible and she needs to be seen can slot her in tomorrow wherever you can put 30 min together

## 2017-12-27 NOTE — Telephone Encounter (Signed)
Pt called to set up appt for HFU. There is not a 30 min appt this week. Is it ok to schedule with Dr Glori Bickers, or pt is avail after 12noon Tues.

## 2017-12-28 ENCOUNTER — Other Ambulatory Visit: Payer: Self-pay | Admitting: Family Medicine

## 2017-12-28 ENCOUNTER — Encounter: Payer: Self-pay | Admitting: Family Medicine

## 2017-12-28 ENCOUNTER — Ambulatory Visit: Payer: 59 | Admitting: Family Medicine

## 2017-12-28 VITALS — BP 128/70 | HR 88 | Temp 98.1°F | Ht 66.0 in | Wt 173.2 lb

## 2017-12-28 DIAGNOSIS — Z87440 Personal history of urinary (tract) infections: Secondary | ICD-10-CM | POA: Diagnosis not present

## 2017-12-28 DIAGNOSIS — M545 Low back pain, unspecified: Secondary | ICD-10-CM

## 2017-12-28 DIAGNOSIS — N12 Tubulo-interstitial nephritis, not specified as acute or chronic: Secondary | ICD-10-CM | POA: Insufficient documentation

## 2017-12-28 LAB — CBC WITH DIFFERENTIAL/PLATELET
Basophils Absolute: 0.1 10*3/uL (ref 0.0–0.1)
Basophils Relative: 0.8 % (ref 0.0–3.0)
EOS ABS: 0.2 10*3/uL (ref 0.0–0.7)
EOS PCT: 2.1 % (ref 0.0–5.0)
HEMATOCRIT: 38.6 % (ref 36.0–46.0)
HEMOGLOBIN: 12.8 g/dL (ref 12.0–15.0)
Lymphocytes Relative: 46.6 % — ABNORMAL HIGH (ref 12.0–46.0)
Lymphs Abs: 3.8 10*3/uL (ref 0.7–4.0)
MCHC: 33.1 g/dL (ref 30.0–36.0)
MCV: 95.5 fl (ref 78.0–100.0)
MONO ABS: 0.6 10*3/uL (ref 0.1–1.0)
Monocytes Relative: 7.5 % (ref 3.0–12.0)
NEUTROS ABS: 3.5 10*3/uL (ref 1.4–7.7)
Neutrophils Relative %: 43 % (ref 43.0–77.0)
PLATELETS: 301 10*3/uL (ref 150.0–400.0)
RBC: 4.04 Mil/uL (ref 3.87–5.11)
RDW: 13.3 % (ref 11.5–15.5)
WBC: 8.2 10*3/uL (ref 4.0–10.5)

## 2017-12-28 LAB — RENAL FUNCTION PANEL
ALBUMIN: 4.2 g/dL (ref 3.5–5.2)
BUN: 14 mg/dL (ref 6–23)
CO2: 28 meq/L (ref 19–32)
Calcium: 10.3 mg/dL (ref 8.4–10.5)
Chloride: 102 mEq/L (ref 96–112)
Creatinine, Ser: 0.82 mg/dL (ref 0.40–1.20)
GFR: 74.51 mL/min (ref >60.00)
Glucose, Bld: 214 mg/dL — ABNORMAL HIGH (ref 70–99)
POTASSIUM: 3.9 meq/L (ref 3.5–5.1)
Phosphorus: 3.1 mg/dL (ref 2.3–4.6)
Sodium: 137 mEq/L (ref 135–145)

## 2017-12-28 LAB — POC URINALSYSI DIPSTICK (AUTOMATED)
BILIRUBIN UA: NEGATIVE
GLUCOSE UA: POSITIVE — AB
KETONES UA: NEGATIVE
Nitrite, UA: NEGATIVE
Protein, UA: NEGATIVE
RBC UA: NEGATIVE
SPEC GRAV UA: 1.02 (ref 1.010–1.025)
Urobilinogen, UA: 0.2 E.U./dL
pH, UA: 6 (ref 5.0–8.0)

## 2017-12-28 NOTE — Assessment & Plan Note (Signed)
In a diabetic-formerly poorly controlled Rev last visit/labs (e coli infection) Reviewed hospital records, lab results and studies in detail   Doing much better s/p rocephin and keflex A little back pain (may be muscular)  UA today with some wbc-sent for culture Long disc about water intake Rev CT scan  inst to update if any symptoms return

## 2017-12-28 NOTE — Telephone Encounter (Signed)
I made appt for 11/26 at 2:30pm with Dr Glori Bickers. Dr Diona Browner next avail is 12/3.

## 2017-12-28 NOTE — Progress Notes (Signed)
Subjective:    Patient ID: Jean Robinson, female    DOB: 11-11-1955, 62 y.o.   MRN: [id number]  HPI Here for f/u of last visit and ED visit for pyelonephritis on 11/6  Wt Readings from Last 3 Encounters:  12/28/17 173 lb 4 oz (78.6 kg)  12/08/17 170 lb (77.1 kg)  12/08/17 170 lb 8 oz (77.3 kg)   27.96 kg/m   She presented with abdominal pain and fever  Was seen in office -found to have pos ua and high wbc Lab Results  Component Value Date   WBC 12.8 (H) 12/08/2017   HGB 12.6 12/08/2017   HCT 38.3 12/08/2017   MCV 94.8 12/08/2017   PLT 250 12/08/2017   temp of 100.2 in ED  Na 131 Glucose 254  Imaging Ct Abdomen Pelvis W Contrast  Result Date: 12/08/2017 CLINICAL DATA:  Abdominal pain for a few days. EXAM: CT ABDOMEN AND PELVIS WITH CONTRAST TECHNIQUE: Multidetector CT imaging of the abdomen and pelvis was performed using the standard protocol following bolus administration of intravenous contrast. CONTRAST:  13mL OMNIPAQUE IOHEXOL 300 MG/ML  SOLN COMPARISON:  09/19/2015 FINDINGS: Lower chest: Atelectasis in the lung bases. Small esophageal hiatal hernia. Hepatobiliary: Surgical absence of the gallbladder. Extrahepatic bile duct dilatation is likely postoperative. No common duct stones identified. Diffuse fatty infiltration of the liver. Pancreas: Calcifications in the head of the pancreas probably reflect chronic pancreatitis. Mild edema around the tail of the pancreas and in the left anterior pararenal space may indicate changes of acute pancreatitis. No pancreatic ductal dilatation or mass identified. Spleen: Normal in size without focal abnormality. Adrenals/Urinary Tract: No adrenal gland nodules. Bilateral renal parenchymal cysts. Heterogeneous left renal nephrogram with stranding in the pararenal fat likely representing pyelonephritis. No hydronephrosis or hydroureter. Right kidney and bladder are unremarkable. Stomach/Bowel: Stomach is within normal limits. Appendix appears  surgically absent. No evidence of bowel wall thickening, distention, or inflammatory changes. Vascular/Lymphatic: Aortic atherosclerosis. No enlarged abdominal or pelvic lymph nodes. Reproductive: Uterus and bilateral adnexa are unremarkable. Other: Fat in the inguinal canals bilaterally. No free air or free fluid in the abdomen. Musculoskeletal: Degenerative changes in the spine. No destructive bone lesions. IMPRESSION: 1. Inflammatory stranding in the left upper quadrant may represent pancreatitis and/or pyelonephritis. 2. Heterogeneous left renal nephrogram is consistent with pyelonephritis. 3. Calcifications in the pancreas suggest prior chronic pancreatitis. 4. Surgical absence of the gallbladder with bile duct dilatation, likely postoperative. Electronically Signed   By: Lucienne Capers M.D.   On: 12/08/2017 22:14    Was tx with IVF/ and tylenol  Rocephin and 2 g and keflex   Today - much better than she was  A week ago had a little low back pain (but she also has some arthritis)  Wanted a re check for her own peace of mind  Never had classic urinary symptoms -so difficult to tell if she had uti  She did have bad urine odor  Had e coli in urine   She is trying to drink lots of water     ua with mod leukocytes and glucose  Results for orders placed or performed in visit on 12/28/17  POCT Urinalysis Dipstick (Automated)  Result Value Ref Range   Color, UA Light Yellow    Clarity, UA Clear    Glucose, UA Positive (A) Negative   Bilirubin, UA Negative    Ketones, UA Negative    Spec Grav, UA 1.020 1.010 - 1.025   Blood, UA Negative  pH, UA 6.0 5.0 - 8.0   Protein, UA Negative Negative   Urobilinogen, UA 0.2 0.2 or 1.0 E.U./dL   Nitrite, UA Negative    Leukocytes, UA Moderate (2+) (A) Negative   just had lunch  Lab Results  Component Value Date   HGBA1C 8.3 (H) 09/20/2017  that was down to 11  Blood sugar is improving  Limited exercise due to tibial plateau fx this year-  gradually improved  Patient Active Problem List   Diagnosis Date Noted  . Pyelonephritis 12/28/2017  . Tachycardia 12/08/2017  . Fever 12/08/2017  . Low back pain 12/08/2017  . Diarrhea 12/08/2017  . High serum calcium 10/01/2017  . Closed fracture of lateral portion of right tibial plateau 07/30/2017  . OA (osteoarthritis) of finger 03/05/2017  . Trigger finger, right middle finger 08/11/2016  . Genital herpes 10/10/2015  . Diabetes type 2, uncontrolled ([place])   . DEPRESSION, MILD 02/27/2008  . Hyperlipidemia LDL goal <100 11/18/2006  . Essential hypertension, benign 11/18/2006  . ALLERGIC RHINITIS 11/18/2006   Past Medical History:  Diagnosis Date  . Allergy   . Anxiety   . Closed fracture of lateral portion of right tibial plateau 07/30/2017  . Depression   . Diabetes mellitus   . H/O renal calculi 10/08/09  . Hyperlipidemia   . Hypertension   . Serum calcium elevated   . STD (sexually transmitted disease)    HSV I & II, IgG reflex positive  . Tibial plateau fracture, right    Past Surgical History:  Procedure Laterality Date  . APPENDECTOMY    . CHOLECYSTECTOMY    . ORIF TIBIA PLATEAU Right 07/30/2017   Procedure: OPEN REDUCTION INTERNAL FIXATION (ORIF) TIBIAL PLATEAU;  Surgeon: Marchia Bond, MD;  Location: [facility];  Service: Orthopedics;  Laterality: Right;  . TONSILLECTOMY     Social History   Tobacco Use  . Smoking status: Never Smoker  . Smokeless tobacco: Never Used  Substance Use Topics  . Alcohol use: Yes    Alcohol/week: 0.0 - 2.0 standard drinks    Comment: social  . Drug use: No   Family History  Problem Relation Age of Onset  . Depression Father   . Diabetes Father   . COPD Mother   . Diabetes Mother   . Macular degeneration Mother   . Diabetes Brother   . Cancer Other        liver  . Diabetes Other    No Known Allergies Current Outpatient Medications on File Prior to Visit  Medication Sig Dispense Refill  . aspirin 81  MG tablet Take 81 mg by mouth daily.      Marland Kitchen glucose blood (ONETOUCH VERIO) test strip Use to check blood sugar twice daily.  Dx: E11.65 200 each 3  . Insulin Detemir (LEVEMIR FLEXTOUCH) 100 UNIT/ML Pen Inject 45 Units into the skin daily at 10 pm. If fasting blood sugar > 120 for 2 or 3 days in row,  increase insulin by 2 units.  15 ml = 30 day supply (Patient taking differently: Inject 45 Units into the skin daily at 10 pm. ) 15 pen 11  . Insulin Pen Needle (PEN NEEDLES) 31G X 8 MM MISC Use to inject Levemir daily 90 each 3  . Lancet Devices (ONE TOUCH DELICA LANCING DEV) MISC Check blood sugar twice a day and as directed. Dx E11.65 1 each 0  . latanoprost (XALATAN) 0.005 % ophthalmic solution Place 1 drop into both eyes at bedtime.     Marland Kitchen  lisinopril (PRINIVIL,ZESTRIL) 40 MG tablet Take 1 tablet (40 mg total) by mouth daily. 90 tablet 3  . metFORMIN (GLUCOPHAGE-XR) 500 MG 24 hr tablet TAKE TWO (2) TABLETS BY MOUTH 2 TIMES DAILY (Patient taking differently: Take 1,000 mg by mouth 2 (two) times daily. ) 360 tablet 3  . pravastatin (PRAVACHOL) 40 MG tablet TAKE 1 TABLET BY MOUTH EVERY DAY (Patient taking differently: Take 40 mg by mouth daily. ) 90 tablet 1  . sertraline (ZOLOFT) 25 MG tablet TAKE 1 TABLET BY MOUTH EVERY DAY (Patient taking differently: Take 25 mg by mouth daily. ) 90 tablet 1  . valACYclovir (VALTREX) 500 MG tablet Take 1 tablet (500 mg total) by mouth 2 (two) times daily. Take for 3 days for acute flare 60 tablet 6   No current facility-administered medications on file prior to visit.     Review of Systems  Constitutional: Positive for fatigue. Negative for activity change, appetite change, fever and unexpected weight change.       Fatigue from illness has improved   HENT: Negative for congestion, ear pain, rhinorrhea, sinus pressure and sore throat.   Eyes: Negative for pain, redness and visual disturbance.  Respiratory: Negative for cough, shortness of breath and wheezing.     Cardiovascular: Negative for chest pain and palpitations.  Gastrointestinal: Negative for abdominal pain, blood in stool, constipation and diarrhea.  Endocrine: Negative for polydipsia and polyuria.  Genitourinary: Negative for dysuria, frequency and urgency.       No urine odor   Musculoskeletal: Positive for back pain. Negative for arthralgias and myalgias.       Low back pain on and off No flank pain  Skin: Negative for pallor and rash.  Allergic/Immunologic: Negative for environmental allergies.  Neurological: Negative for dizziness, syncope and headaches.  Hematological: Negative for adenopathy. Does not bruise/bleed easily.  Psychiatric/Behavioral: Negative for decreased concentration and dysphoric mood. The patient is not nervous/anxious.        Objective:   Physical Exam  Constitutional: She appears well-developed and well-nourished. No distress.  Well appearing   HENT:  Head: Normocephalic and atraumatic.  Eyes: Pupils are equal, round, and reactive to light. Conjunctivae and EOM are normal.  Neck: Normal range of motion. Neck supple.  Cardiovascular: Normal rate, regular rhythm and normal heart sounds.  Pulmonary/Chest: Effort normal and breath sounds normal.  Abdominal: Soft. Bowel sounds are normal. She exhibits no distension. There is no tenderness. There is no rebound and no guarding.  No cva tenderness  no suprapubic tenderness  Musculoskeletal: She exhibits no edema or deformity.  Lymphadenopathy:    She has no cervical adenopathy.  Neurological: She is alert.  Skin: No rash noted. No pallor.  Psychiatric: She has a normal mood and affect.  Pleasant           Assessment & Plan:   Problem List Items Addressed This Visit      Genitourinary   Pyelonephritis - Primary    In a diabetic-formerly poorly controlled Rev last visit/labs (e coli infection) Reviewed hospital records, lab results and studies in detail   Doing much better s/p rocephin and  keflex A little back pain (may be muscular)  UA today with some wbc-sent for culture Long disc about water intake Rev CT scan  inst to update if any symptoms return       Relevant Orders   Renal function panel (Completed)   CBC with Differential/Platelet (Completed)   Urine Culture     Other  Low back pain    Suspect MS at this time s/p pyelonephritis since there is no flank pain  Urine however has wbc-will cx       Other Visit Diagnoses    History of UTI       Relevant Orders   POCT Urinalysis Dipstick (Automated) (Completed)   Urine Culture

## 2017-12-28 NOTE — Assessment & Plan Note (Signed)
Suspect MS at this time s/p pyelonephritis since there is no flank pain  Urine however has wbc-will cx

## 2017-12-28 NOTE — Patient Instructions (Addendum)
Try to drink 64 oz of fluids per day (mostly water)   Labs today  Urine culture sent-there were some white cells in urine / we will make sure there if no residual infection

## 2017-12-29 DIAGNOSIS — S82101G Unspecified fracture of upper end of right tibia, subsequent encounter for closed fracture with delayed healing: Secondary | ICD-10-CM | POA: Diagnosis not present

## 2017-12-30 LAB — URINE CULTURE
MICRO NUMBER:: 91424913
SPECIMEN QUALITY:: ADEQUATE

## 2017-12-31 ENCOUNTER — Telehealth: Payer: Self-pay | Admitting: Family Medicine

## 2017-12-31 MED ORDER — SULFAMETHOXAZOLE-TRIMETHOPRIM 800-160 MG PO TABS: 1.0000 | ORAL_TABLET | Freq: Two times a day (BID) | ORAL | 0 refills | Status: DC

## 2018-01-04 ENCOUNTER — Other Ambulatory Visit: Payer: BLUE CROSS/BLUE SHIELD

## 2018-01-04 ENCOUNTER — Encounter: Payer: Self-pay | Admitting: Family Medicine

## 2018-01-04 DIAGNOSIS — N390 Urinary tract infection, site not specified: Secondary | ICD-10-CM | POA: Diagnosis not present

## 2018-01-04 DIAGNOSIS — I10 Essential (primary) hypertension: Secondary | ICD-10-CM | POA: Diagnosis not present

## 2018-01-04 DIAGNOSIS — E1165 Type 2 diabetes mellitus with hyperglycemia: Secondary | ICD-10-CM | POA: Diagnosis not present

## 2018-01-04 LAB — HEMOGLOBIN A1C: HEMOGLOBIN A1C: 8.3

## 2018-01-07 ENCOUNTER — Ambulatory Visit: Payer: BLUE CROSS/BLUE SHIELD | Admitting: Family Medicine

## 2018-02-03 ENCOUNTER — Other Ambulatory Visit: Payer: 59

## 2018-02-03 ENCOUNTER — Telehealth: Payer: Self-pay | Admitting: Family Medicine

## 2018-02-03 DIAGNOSIS — E785 Hyperlipidemia, unspecified: Secondary | ICD-10-CM

## 2018-02-03 DIAGNOSIS — E1165 Type 2 diabetes mellitus with hyperglycemia: Secondary | ICD-10-CM

## 2018-02-03 NOTE — Telephone Encounter (Signed)
Orders

## 2018-02-08 NOTE — Telephone Encounter (Signed)
medication

## 2018-02-10 ENCOUNTER — Ambulatory Visit: Payer: 59 | Admitting: Family Medicine

## 2018-02-28 ENCOUNTER — Other Ambulatory Visit: Payer: Self-pay | Admitting: Family Medicine

## 2018-02-28 ENCOUNTER — Telehealth: Payer: Self-pay | Admitting: Family Medicine

## 2018-02-28 NOTE — Telephone Encounter (Signed)
Left message asking pt to call office   Please try and schedule Jean Robinson for Diabetes follow up with fasting labs prior for Dr. Diona Browner. Looks like she cancelled her last appointment.   Thanks  Butch Penny

## 2018-03-02 ENCOUNTER — Other Ambulatory Visit: Payer: Self-pay | Admitting: Family Medicine

## 2018-03-08 ENCOUNTER — Other Ambulatory Visit: Payer: Self-pay

## 2018-03-08 DIAGNOSIS — I10 Essential (primary) hypertension: Secondary | ICD-10-CM

## 2018-03-08 MED ORDER — LISINOPRIL 40 MG PO TABS: 40.0000 mg | ORAL_TABLET | Freq: Every day | ORAL | 0 refills | Status: DC

## 2018-03-08 NOTE — Telephone Encounter (Signed)
Left message. Patient needs an appointment with Dr. Diona Browner for follow up. Refilling Lisinopril for 90 day supply.

## 2018-03-08 NOTE — Telephone Encounter (Signed)
Need sooner DM fol,ow up.. in next month.. work in.

## 2018-03-08 NOTE — Telephone Encounter (Signed)
Butch Penny I scheduled  ms Klauer a cpx 5/1 with labs 4/30. Is this ok or do you want pt in sooner for follow up for dm

## 2018-03-09 NOTE — Telephone Encounter (Signed)
Left message asking pt to call office  °

## 2018-03-10 ENCOUNTER — Other Ambulatory Visit: Payer: Self-pay | Admitting: Family Medicine

## 2018-03-10 DIAGNOSIS — I10 Essential (primary) hypertension: Secondary | ICD-10-CM

## 2018-03-10 NOTE — Telephone Encounter (Signed)
No .. keep the CPX as is in 06/2018.. have ENDO send records if not in media.

## 2018-03-10 NOTE — Telephone Encounter (Signed)
Spoke with pt she stated she saw dr Antony Haste bindubal endocrinology @ [facility] 01/04/18 and has a follow up with her in March.  She wanted to know if she still needed to follow up with you for her dm

## 2018-03-11 ENCOUNTER — Encounter: Payer: Self-pay | Admitting: Family Medicine

## 2018-03-11 NOTE — Telephone Encounter (Signed)
Left message for Jean Robinson that she does not need to follow up with Dr. Diona Browner for her diabetes since she saw the endocrinologist in 01/2018.  Office notes from that visit are in media.  Advised to just keep her CPE scheduled in May 2020.  A1c from endocrinologist abstracted to chart.

## 2018-03-15 DIAGNOSIS — Z780 Asymptomatic menopausal state: Secondary | ICD-10-CM | POA: Diagnosis not present

## 2018-03-15 DIAGNOSIS — M8589 Other specified disorders of bone density and structure, multiple sites: Secondary | ICD-10-CM | POA: Diagnosis not present

## 2018-03-21 ENCOUNTER — Telehealth: Payer: Self-pay | Admitting: Obstetrics and Gynecology

## 2018-03-21 NOTE — Telephone Encounter (Signed)
Patient is asking for an appointment for her lower abdominal pain that she has been having.

## 2018-03-30 NOTE — Telephone Encounter (Signed)
Call to patient. Left message to call back to speak to triage nurse.

## 2018-03-31 NOTE — Telephone Encounter (Signed)
Message left to return call to Vashawn Ekstein at [phone].    

## 2018-04-01 NOTE — Telephone Encounter (Signed)
No office visit needed if pain is resolved.  I am glad she is feeling better.

## 2018-04-01 NOTE — Telephone Encounter (Signed)
Spoke with patient.  She states about 3 weeks ago had "sporatic mild pain" near L ovary, this lasted for one week and went away. Has not recurred.  Denies vaginal bleeding, denies fevers. Denies bloating, denies any changes in bowel or bladder habits. No vaginal discharge.  She can press on the area and does not illicit any pain.  She has made note of this pain and states she will call back if this occurs again for an office visit.  Declines to schedule an office visit at this time unless Dr. Quincy Simmonds feels it is really necessary.  The patient states she would like to continue to monitor if pain recurs within the the month and will call back for an office visit.

## 2018-04-07 ENCOUNTER — Encounter: Payer: Self-pay | Admitting: Family Medicine

## 2018-04-07 ENCOUNTER — Ambulatory Visit: Payer: 59 | Admitting: Family Medicine

## 2018-04-07 VITALS — BP 118/60 | HR 94 | Temp 97.6°F | Ht 66.0 in | Wt 179.2 lb

## 2018-04-07 DIAGNOSIS — E213 Hyperparathyroidism, unspecified: Secondary | ICD-10-CM | POA: Diagnosis not present

## 2018-04-07 DIAGNOSIS — I10 Essential (primary) hypertension: Secondary | ICD-10-CM

## 2018-04-07 DIAGNOSIS — E1165 Type 2 diabetes mellitus with hyperglycemia: Secondary | ICD-10-CM

## 2018-04-07 LAB — COMPREHENSIVE METABOLIC PANEL
ALBUMIN: 4.4 g/dL (ref 3.5–5.2)
ALT: 19 U/L (ref 0–35)
AST: 16 U/L (ref 0–37)
Alkaline Phosphatase: 75 U/L (ref 39–117)
BUN: 20 mg/dL (ref 6–23)
CHLORIDE: 102 meq/L (ref 96–112)
CO2: 29 mEq/L (ref 19–32)
CREATININE: 0.77 mg/dL (ref 0.40–1.20)
Calcium: 10.6 mg/dL — ABNORMAL HIGH (ref 8.4–10.5)
GFR: 75.32 mL/min (ref >60.00)
Glucose, Bld: 99 mg/dL (ref 70–99)
Potassium: 4.1 mEq/L (ref 3.5–5.1)
Sodium: 137 mEq/L (ref 135–145)
Total Bilirubin: 0.4 mg/dL (ref 0.2–1.2)
Total Protein: 7.4 g/dL (ref 6.0–8.3)

## 2018-04-07 LAB — HEMOGLOBIN A1C: Hgb A1c MFr Bld: 7.9 % — ABNORMAL HIGH (ref 4.6–6.5)

## 2018-04-07 MED ORDER — GLUCOSE BLOOD VI STRP: ORAL_STRIP | 3 refills | Status: DC

## 2018-04-07 NOTE — Assessment & Plan Note (Signed)
Cuff not reliable.. have calibrated or get a new one.  Will eval with CMET to make sure no new issue with renal function or liver.  Consider adding diuretic if still high at home... work on weight loss.

## 2018-04-07 NOTE — Progress Notes (Signed)
Subjective:    Patient ID: Jean Robinson, female    DOB: [birth year], 63 y.o.   MRN: [id number]  HPI  63 year old female presents for BP check  Hypertension:   She is on lisinopril 40 mg daily   She has noted increase in BPs at home in last  Several weeks.. 123-160/92  She has been having mild frontal headaches  Cuff at office shows good control.. 20 lbs lower than home cuff in office today BP Readings from Last 3 Encounters:  04/07/18 118/60  12/28/17 128/70  12/08/17 119/71  Using medication without problems or lightheadedness:  none Chest pain with exertion: none Edema:none Short of breath:none Average home BPs: Other issues:   She has been under more stress lately.  She has been trying to keep sodium intake under control. She is trying to get back to exercise.   thyroid was normal in 01/2018 per ENDO.  Has gained 9 lbs in last 3 weeks. Wt Readings from Last 3 Encounters:  04/07/18 179 lb 4 oz (81.3 kg)  12/28/17 173 lb 4 oz (78.6 kg)  12/08/17 170 lb (77.1 kg)   Lab Results  Component Value Date   HGBA1C 8.3 01/04/2018   Started on glimperide for DM.. sugar better controlled. FBS 89 this AM  Has appt with D.r Chalmers Cater today.    Social History /Family History/Past Medical History reviewed in detail and updated in EMR if needed. Blood pressure 118/60, pulse 94, temperature 97.6 F (36.4 C), temperature source Oral, height 5\' 6"  (1.676 m), weight 179 lb 4 oz (81.3 kg), last menstrual period 02/02/2006, SpO2 97 %.  Review of Systems  Constitutional: Negative for fatigue and fever.  HENT: Negative for congestion.   Eyes: Negative for pain.  Respiratory: Negative for cough and shortness of breath.   Cardiovascular: Negative for chest pain, palpitations and leg swelling.  Gastrointestinal: Negative for abdominal pain.  Genitourinary: Negative for dysuria and vaginal bleeding.  Musculoskeletal: Negative for back pain.  Neurological: Negative for syncope,  light-headedness and headaches.  Psychiatric/Behavioral: Negative for dysphoric mood.       Objective:   Physical Exam Constitutional:      General: She is not in acute distress.    Appearance: Normal appearance. She is well-developed. She is not ill-appearing or toxic-appearing.  HENT:     Head: Normocephalic.     Right Ear: Hearing, tympanic membrane, ear canal and external ear normal. Tympanic membrane is not erythematous, retracted or bulging.     Left Ear: Hearing, tympanic membrane, ear canal and external ear normal. Tympanic membrane is not erythematous, retracted or bulging.     Nose: No mucosal edema or rhinorrhea.     Right Sinus: No maxillary sinus tenderness or frontal sinus tenderness.     Left Sinus: No maxillary sinus tenderness or frontal sinus tenderness.     Mouth/Throat:     Pharynx: Uvula midline.  Eyes:     General: Lids are normal. Lids are everted, no foreign bodies appreciated.     Conjunctiva/sclera: Conjunctivae normal.     Pupils: Pupils are equal, round, and reactive to light.  Neck:     Musculoskeletal: Normal range of motion and neck supple.     Thyroid: No thyroid mass or thyromegaly.     Vascular: No carotid bruit.     Trachea: Trachea normal.  Cardiovascular:     Rate and Rhythm: Normal rate and regular rhythm.     Pulses: Normal pulses.  Heart sounds: Normal heart sounds, S1 normal and S2 normal. No murmur. No friction rub. No gallop.   Pulmonary:     Effort: Pulmonary effort is normal. No tachypnea or respiratory distress.     Breath sounds: Normal breath sounds. No decreased breath sounds, wheezing, rhonchi or rales.  Abdominal:     General: Bowel sounds are normal.     Palpations: Abdomen is soft.     Tenderness: There is no abdominal tenderness.  Skin:    General: Skin is warm and dry.     Findings: No rash.  Neurological:     Mental Status: She is alert.  Psychiatric:        Mood and Affect: Mood is not anxious or depressed.         Speech: Speech normal.        Behavior: Behavior normal. Behavior is cooperative.        Thought Content: Thought content normal.        Judgment: Judgment normal.           Assessment & Plan:

## 2018-04-07 NOTE — Assessment & Plan Note (Signed)
Due for re-eval today. Will see ENDO for hypercalcemia today.

## 2018-04-07 NOTE — Patient Instructions (Addendum)
Have home cuff calibrated  Or get new one.  Follow BP in next  2 weeks and call with measurements.  Please stop at the lab to have labs drawn.  We may need to add additional medication.

## 2018-04-15 ENCOUNTER — Telehealth: Payer: Self-pay

## 2018-04-15 NOTE — Telephone Encounter (Signed)
Pt left v/m;pt is not having any symptoms of corona virus but pt has a lot of concerns about working and pt being diabetic and 63 yrs old. 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 Pt said she is high risk due to her age and diabetes and pt wants to know if Dr Diona Browner could give pt a note that it is not at pts best interest to be working now. Pt request cb.

## 2018-04-15 NOTE — Telephone Encounter (Signed)
Okay to write note as requested.

## 2018-04-18 ENCOUNTER — Encounter: Payer: Self-pay | Admitting: *Deleted

## 2018-04-18 NOTE — Telephone Encounter (Signed)
Work note written and sent to patient via MyChart. 

## 2018-04-20 ENCOUNTER — Other Ambulatory Visit: Payer: Self-pay | Admitting: Family Medicine

## 2018-04-26 NOTE — Telephone Encounter (Signed)
Okay to close encounter?  °

## 2018-04-26 NOTE — Telephone Encounter (Signed)
Encounter reviewed and closed.  

## 2018-05-12 ENCOUNTER — Other Ambulatory Visit: Payer: Self-pay | Admitting: Family Medicine

## 2018-05-27 ENCOUNTER — Other Ambulatory Visit: Payer: Self-pay | Admitting: Family Medicine

## 2018-05-30 ENCOUNTER — Other Ambulatory Visit: Payer: Self-pay | Admitting: *Deleted

## 2018-05-30 MED ORDER — SERTRALINE HCL 25 MG PO TABS: 25.0000 mg | ORAL_TABLET | Freq: Every day | ORAL | 1 refills | Status: DC

## 2018-06-02 ENCOUNTER — Other Ambulatory Visit: Payer: 59

## 2018-06-03 ENCOUNTER — Encounter: Payer: 59 | Admitting: Family Medicine

## 2018-06-29 ENCOUNTER — Telehealth: Payer: Self-pay | Admitting: Family Medicine

## 2018-06-29 NOTE — Telephone Encounter (Signed)
FMLA paperwork on cart to be delivered by carrie  Pt wants fmla paperwork because of high risk for covid 19

## 2018-06-30 DIAGNOSIS — Z0279 Encounter for issue of other medical certificate: Secondary | ICD-10-CM

## 2018-06-30 NOTE — Telephone Encounter (Signed)
Completed and in outbox. 

## 2018-07-01 NOTE — Telephone Encounter (Signed)
Paperwork faxed °

## 2018-07-06 IMAGING — CR DG KNEE COMPLETE 4+V*R*
4 series · 4 of 4 positions shown · non-contrast
Comparison: None.

CLINICAL DATA: Fall with right knee pain

EXAM:
RIGHT KNEE - COMPLETE 4+ VIEW

[knee ap]
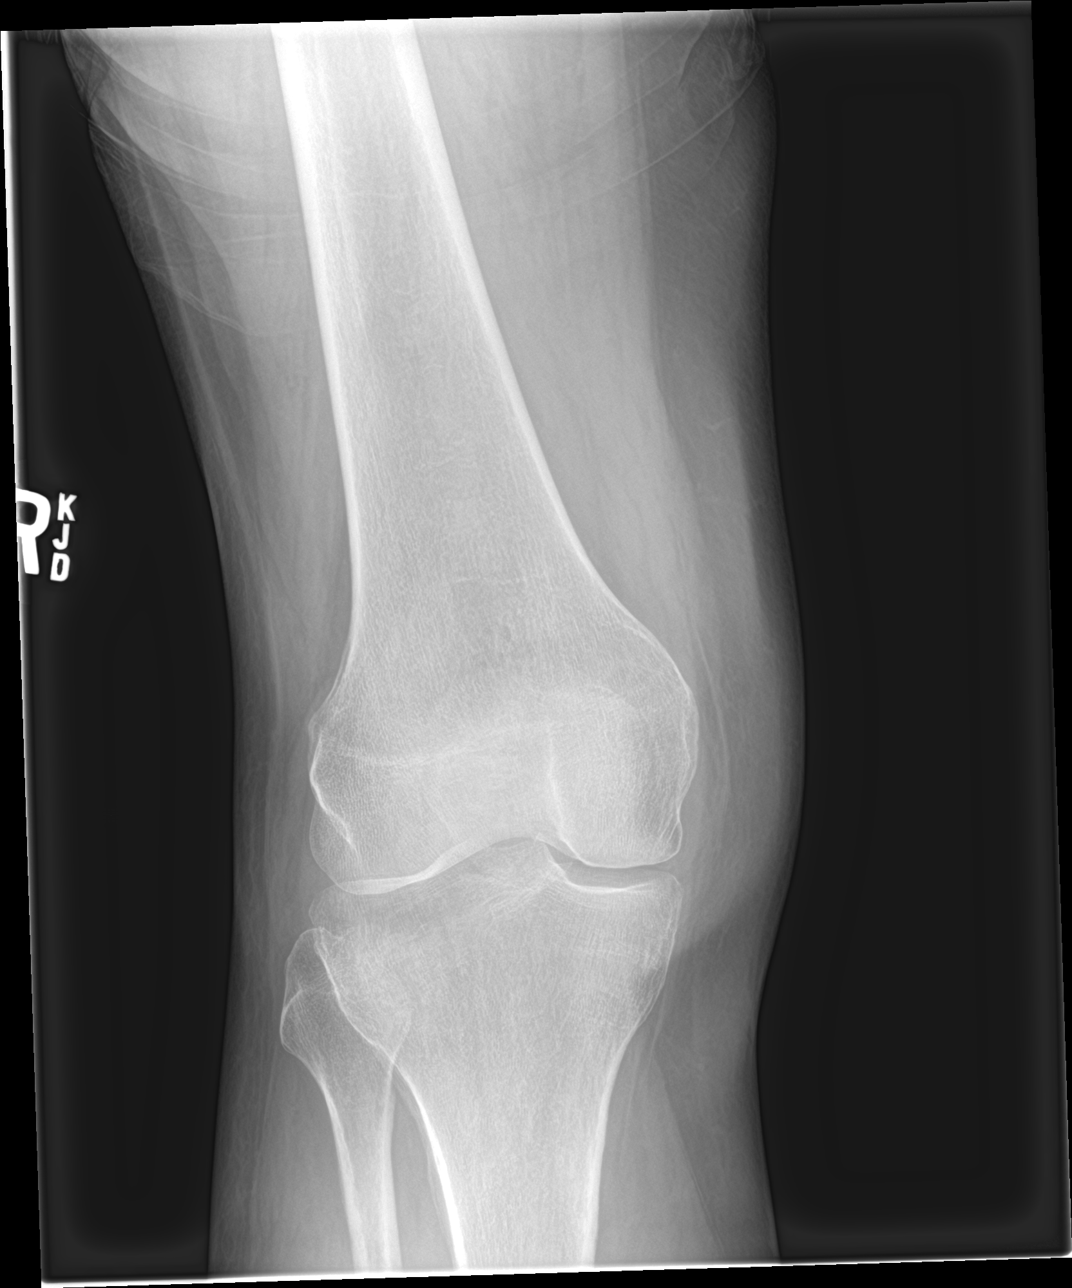

[knee lat]
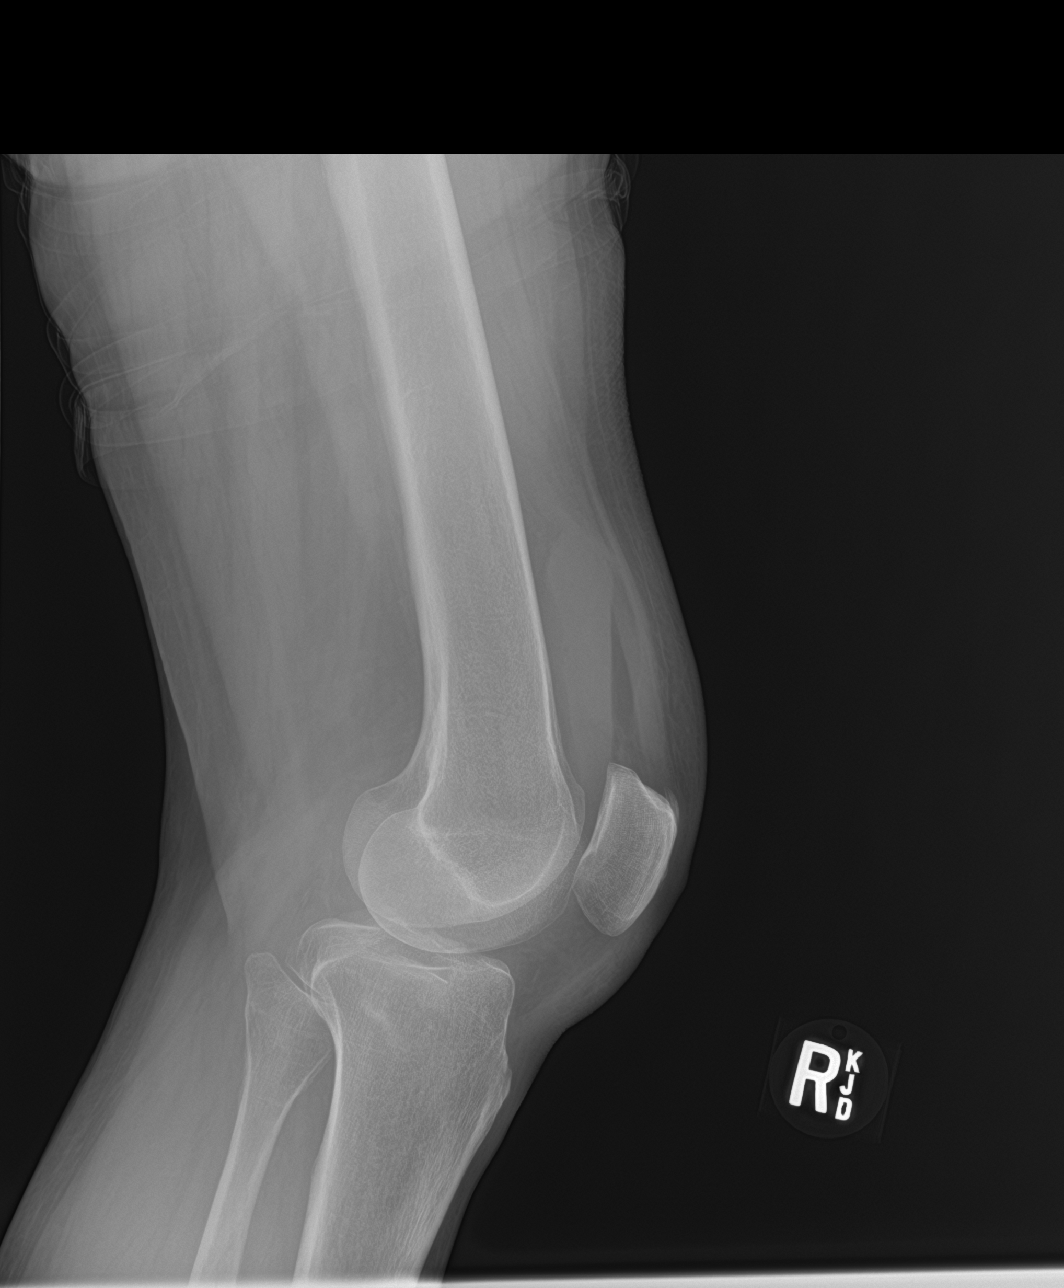

[knee obl (1 of 2)]
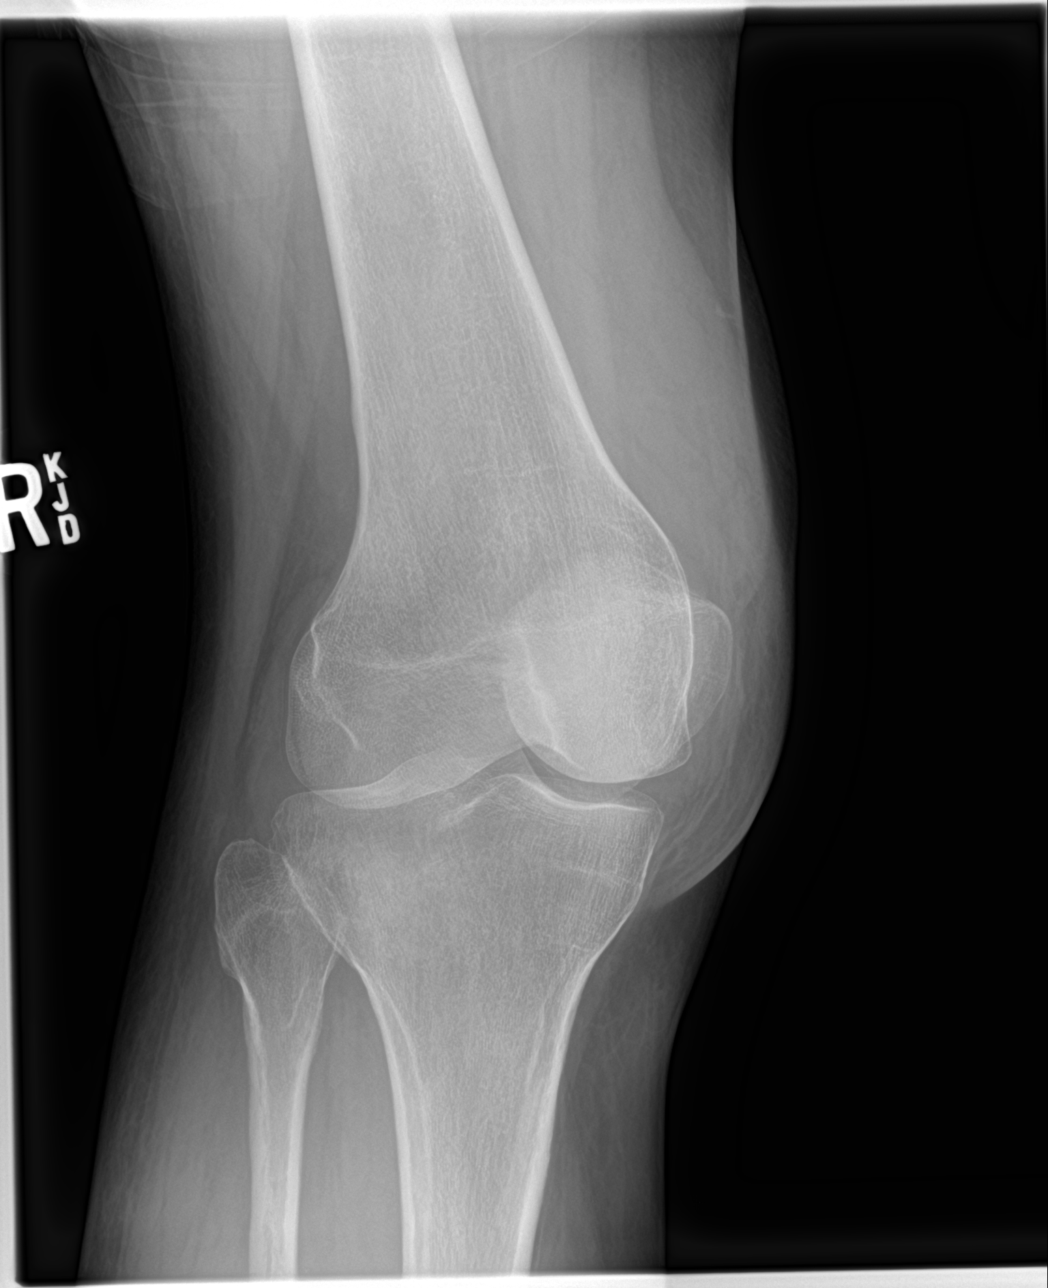

[knee obl (2 of 2)]
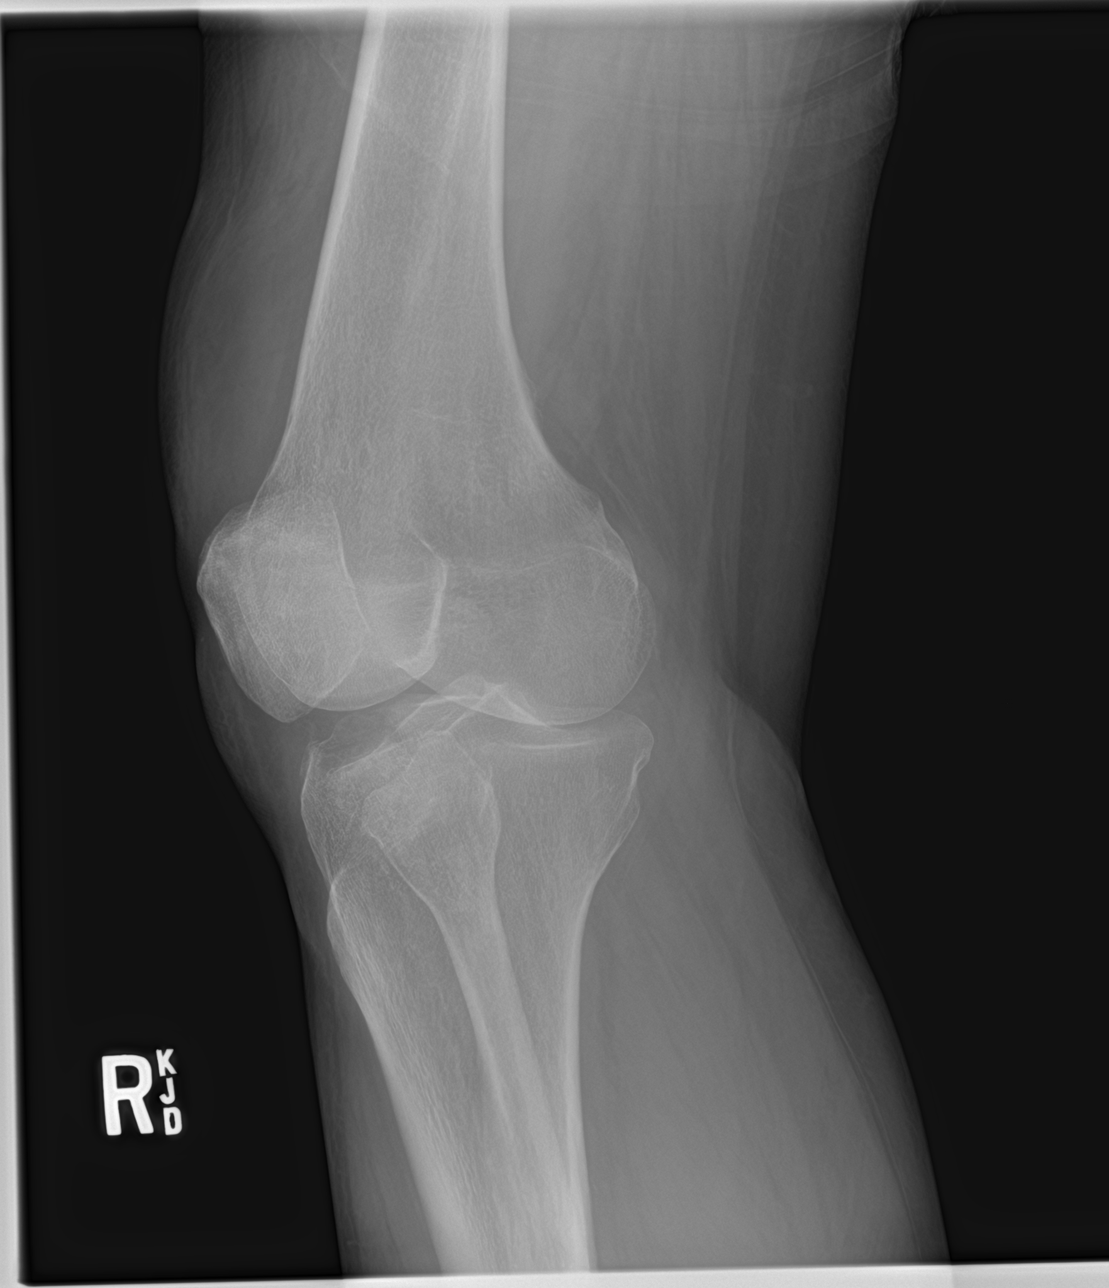

[4 of 4 positions shown; findings below may reference images not displayed]

FINDINGS: Large knee effusion with hematocrit level. Mild patellofemoral
degenerative change. Suspected intra-articular fracture of the
proximal tibia. No dislocation
IMPRESSION: Intra-articular fracture of the proximal tibia with large
lipohemarthrosis

## 2018-07-19 ENCOUNTER — Other Ambulatory Visit: Payer: Self-pay | Admitting: Family Medicine

## 2018-07-19 MED ORDER — BASAGLAR KWIKPEN 100 UNIT/ML ~~LOC~~ SOPN: PEN_INJECTOR | SUBCUTANEOUS | 11 refills | Status: DC

## 2018-07-19 NOTE — Addendum Note (Signed)
Addended by: Carter Kitten on: 07/19/2018 04:00 PM   Modules accepted: Orders

## 2018-07-19 NOTE — Telephone Encounter (Signed)
Virtual appointment 6/18 Pt aware

## 2018-07-19 NOTE — Telephone Encounter (Signed)
  See Pt Mychart message  Okay to change insulin to balsalgar as pt requested.  Make virtual OV to reassess need for continued leave ( 6/18, 19 or ideally 6/30). She has been out sine 04/2018 I believe.

## 2018-07-19 NOTE — Telephone Encounter (Signed)
Please confirm insulin dosage and sign Rx.

## 2018-07-20 NOTE — Telephone Encounter (Signed)
Copy for pt Copy for billing Copy for scan 

## 2018-07-21 ENCOUNTER — Ambulatory Visit (INDEPENDENT_AMBULATORY_CARE_PROVIDER_SITE_OTHER): Payer: 59 | Admitting: Family Medicine

## 2018-07-21 ENCOUNTER — Other Ambulatory Visit: Payer: Self-pay

## 2018-07-21 ENCOUNTER — Ambulatory Visit: Payer: 59 | Admitting: Family Medicine

## 2018-07-21 ENCOUNTER — Encounter: Payer: Self-pay | Admitting: Family Medicine

## 2018-07-21 VITALS — Ht 66.0 in | Wt 180.0 lb

## 2018-07-21 DIAGNOSIS — Z7189 Other specified counseling: Secondary | ICD-10-CM | POA: Diagnosis not present

## 2018-07-21 NOTE — Assessment & Plan Note (Signed)
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 will extend leave until 11/02/2018. Forms will be sent per pt.

## 2018-07-21 NOTE — Progress Notes (Signed)
VIRTUAL VISIT Due to national recommendations of social distancing due to [place] 19, a virtual visit is felt to be most appropriate for this patient at this time.   I connected with the patient on 07/21/18 at  2:40 PM EDT by virtual telehealth platform and verified that I am speaking with the correct person using two identifiers.   I discussed the limitations, risks, security and privacy concerns of performing an evaluation and management service by  virtual telehealth platform and the availability of in person appointments. I also discussed with the patient that there may be a patient responsible charge related to this service. The patient expressed understanding and agreed to proceed.  Patient location: Home Provider Location: [facility] [place] Participants: Eliezer Lofts and Vinie Sill   Chief Complaint  Patient presents with  . Discuss Extending Leave    History of Present Illness: 63 year old female presents to [place] risk and extend leave from work. In 04/2018 she was taken out of work on [employer] given increased risk for complications of COVID.   She works at [employer] job at [employer]. She is concerned  Customers are not wearing masks. Employees do.  She  is diabetic .Marland Kitchen. not ideally controlled. Lab Results  Component Value Date   HGBA1C 7.9 (H) 04/07/2018   Plan extended leave until 11/02/2018.  COVID 19 screen No recent travel or known exposure to COVID19 The patient denies respiratory symptoms of COVID 19 at this time.  The importance of social distancing was discussed today.   Review of Systems  Constitutional: Negative for chills and fever.  HENT: Negative for congestion and ear pain.   Eyes: Negative for pain and redness.  Respiratory: Negative for cough and shortness of breath.   Cardiovascular: Negative for chest pain, palpitations and leg swelling.  Gastrointestinal: Negative for abdominal pain, blood in stool, constipation, diarrhea, nausea and vomiting.   Genitourinary: Negative for dysuria.  Musculoskeletal: Negative for falls and myalgias.  Skin: Negative for rash.  Neurological: Negative for dizziness.  Psychiatric/Behavioral: Negative for depression. The patient is not nervous/anxious.       Past Medical History:  Diagnosis Date  . Allergy   . Anxiety   . Closed fracture of lateral portion of right tibial plateau 07/30/2017  . Depression   . Diabetes mellitus   . H/O renal calculi 10/08/09  . Hyperlipidemia   . Hypertension   . Serum calcium elevated   . STD (sexually transmitted disease)    HSV I & II, IgG reflex positive  . Tibial plateau fracture, right     reports that she has never smoked. She has never used smokeless tobacco. She reports current alcohol use. She reports that she does not use drugs.   Current Outpatient Medications:  .  alendronate (FOSAMAX) 70 MG tablet, Take 70 mg by mouth once a week. , Disp: , Rfl:  .  aspirin 81 MG tablet, Take 81 mg by mouth daily.  , Disp: , Rfl:  .  glimepiride (AMARYL) 2 MG tablet, Take 2 mg by mouth daily with breakfast., Disp: , Rfl:  .  glucose blood (ONETOUCH VERIO) test strip, Use to check blood sugar three times  daily.  Dx: E11.65, Disp: 200 each, Rfl: 3 .  Insulin Glargine (BASAGLAR KWIKPEN) 100 UNIT/ML SOPN, INJECT 45 UNITS IN THE SKIN DAILY AT 10PM. IF FASTING BLOOD SUGAR >120 X2 DAYS, INCREASE BY 2 UNITS, Disp: 15 mL, Rfl: 11 .  Insulin Pen Needle (B-D ULTRAFINE III  SHORT PEN) 31G X 8 MM MISC, USE TO INJECT LEVEMIR DAILY, Disp: 100 each, Rfl: 3 .  Lancet Devices (ONE TOUCH DELICA LANCING DEV) MISC, Check blood sugar twice a day and as directed. Dx E11.65, Disp: 1 each, Rfl: 0 .  latanoprost (XALATAN) 0.005 % ophthalmic solution, Place 1 drop into both eyes at bedtime. , Disp: , Rfl:  .  lisinopril (PRINIVIL,ZESTRIL) 40 MG tablet, TAKE 1 TABLET BY MOUTH EVERY DAY, Disp: 90 tablet, Rfl: 1 .  metFORMIN (GLUCOPHAGE-XR) 500 MG 24 hr tablet, TAKE TWO (2) TABLETS BY MOUTH 2 TIMES  DAILY, Disp: 360 tablet, Rfl: 3 .  pravastatin (PRAVACHOL) 40 MG tablet, TAKE 1 TABLET BY MOUTH EVERY DAY, Disp: 90 tablet, Rfl: 1 .  sertraline (ZOLOFT) 25 MG tablet, Take 1 tablet (25 mg total) by mouth daily., Disp: 90 tablet, Rfl: 1 .  valACYclovir (VALTREX) 500 MG tablet, Take 1 tablet (500 mg total) by mouth 2 (two) times daily. Take for 3 days for acute flare, Disp: 60 tablet, Rfl: 6   Observations/Objective: Height 5\' 6"  (1.676 m), weight 180 lb (81.6 kg), last menstrual period 02/02/2006.  Physical Exam  Physical Exam Constitutional:      General: The patient is not in acute distress. Pulmonary:     Effort: Pulmonary effort is normal. No respiratory distress.  Neurological:     Mental Status: The patient is alert and oriented to person, place, and time.  Psychiatric:        Mood and Affect: Mood normal.        Behavior: Behavior normal.   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 will extend leave until 11/02/2018. Forms will be sent per pt.     I discussed the assessment and treatment plan with the patient. The patient was provided an opportunity to ask questions and all were answered. The patient agreed with the plan and demonstrated an understanding of the instructions.   The patient was advised to call back or seek an in-person evaluation if the symptoms worsen or if the condition fails to improve as anticipated.     Eliezer Lofts, MD

## 2018-07-26 ENCOUNTER — Encounter: Payer: Self-pay | Admitting: Family Medicine

## 2018-07-26 ENCOUNTER — Telehealth: Payer: Self-pay | Admitting: Family Medicine

## 2018-07-26 NOTE — Telephone Encounter (Signed)
Patient called in regards to Disability paperwork that will be faxed over to our office. She stated that at the last that her time off has been extended per Dr Diona Browner. She stated that this paperwork will be due by the 7th.  We advised Dr Diona Browner is out and will come back in the office on the 30th and we would try our absolute hardest to have this filled out and sent before that dead line.  Patient was very upset.

## 2018-07-26 NOTE — Telephone Encounter (Signed)
See last OV...  Plan extended leave until 11/02/2018. Paperwork was sent to [place] after I left on vacation.  Donna/Robin, please  complete paperwork accordingly and stamp with my signature... or if that is not accepted I can write a note.

## 2018-07-27 NOTE — Telephone Encounter (Signed)
Paperwork changed and faxed

## 2018-07-29 NOTE — Telephone Encounter (Signed)
Copy for scan Copy for pt 

## 2018-08-01 ENCOUNTER — Telehealth: Payer: Self-pay | Admitting: *Deleted

## 2018-08-01 NOTE — Telephone Encounter (Signed)
Orvil Feil nurse with [employer] left a voicemail wanting to know why patient's short term disability has to be extended? Orvil Feil wanted to know if it is related to coivd 19 precautionary reasons? Orvil Feil requested a call back regarding this.

## 2018-08-02 NOTE — Telephone Encounter (Signed)
Orvil Feil nurse with [employer] left v/m requesting cb ; is pts leave extended due to precautionary measures due to covid and pt being high risk or and if this is not the reason why is Short term disability being extended.

## 2018-08-02 NOTE — Telephone Encounter (Signed)
Spoke to [employer].   She is aware pt is being high risk for covid  Pt aware

## 2018-09-02 NOTE — Telephone Encounter (Signed)
Pt called stating [employer] will be faxing more paperwork to be filled out Paperwork in dr bedsole's in box

## 2018-09-04 ENCOUNTER — Other Ambulatory Visit: Payer: Self-pay | Admitting: Family Medicine

## 2018-09-08 ENCOUNTER — Telehealth: Payer: Self-pay | Admitting: Family Medicine

## 2018-09-08 DIAGNOSIS — E1165 Type 2 diabetes mellitus with hyperglycemia: Secondary | ICD-10-CM

## 2018-09-08 NOTE — Telephone Encounter (Signed)
-----   Message from Cloyd Stagers, RT sent at 08/31/2018  2:35 PM EDT ----- Regarding: Lab Orders for Friday 8.7.2020 Please place lab orders for Friday 8.7.2020, office visit for physical on Tuesday 8.11.2020 Thank you, Dyke Maes RT(R)

## 2018-09-09 ENCOUNTER — Other Ambulatory Visit: Payer: Self-pay

## 2018-09-09 ENCOUNTER — Other Ambulatory Visit (INDEPENDENT_AMBULATORY_CARE_PROVIDER_SITE_OTHER): Payer: 59

## 2018-09-09 DIAGNOSIS — E1165 Type 2 diabetes mellitus with hyperglycemia: Secondary | ICD-10-CM | POA: Diagnosis not present

## 2018-09-09 LAB — COMPREHENSIVE METABOLIC PANEL
ALT: 14 U/L (ref 0–35)
AST: 14 U/L (ref 0–37)
Albumin: 4.2 g/dL (ref 3.5–5.2)
Alkaline Phosphatase: 65 U/L (ref 39–117)
BUN: 17 mg/dL (ref 6–23)
CO2: 27 mEq/L (ref 19–32)
Calcium: 10 mg/dL (ref 8.4–10.5)
Chloride: 104 mEq/L (ref 96–112)
Creatinine, Ser: 0.8 mg/dL (ref 0.40–1.20)
GFR: 71.97 mL/min (ref >60.00)
Glucose, Bld: 91 mg/dL (ref 70–99)
Potassium: 4.3 mEq/L (ref 3.5–5.1)
Sodium: 139 mEq/L (ref 135–145)
Total Bilirubin: 0.3 mg/dL (ref 0.2–1.2)
Total Protein: 7 g/dL (ref 6.0–8.3)

## 2018-09-09 LAB — LIPID PANEL
Cholesterol: 155 mg/dL (ref 0–200)
HDL: 44.3 mg/dL (ref >39.00)
LDL Cholesterol: 85 mg/dL (ref 0–99)
NonHDL: 110.93
Total CHOL/HDL Ratio: 4
Triglycerides: 132 mg/dL (ref 0.0–149.0)
VLDL: 26.4 mg/dL (ref 0.0–40.0)

## 2018-09-09 LAB — HEMOGLOBIN A1C: Hgb A1c MFr Bld: 8.2 % — ABNORMAL HIGH (ref 4.6–6.5)

## 2018-09-09 NOTE — Progress Notes (Signed)
No critical labs need to be addressed urgently. We will discuss labs in detail at upcoming office visit.   

## 2018-09-13 ENCOUNTER — Encounter: Payer: Self-pay | Admitting: Family Medicine

## 2018-09-13 ENCOUNTER — Other Ambulatory Visit: Payer: Self-pay

## 2018-09-13 ENCOUNTER — Encounter: Payer: 59 | Admitting: Family Medicine

## 2018-09-13 ENCOUNTER — Ambulatory Visit (INDEPENDENT_AMBULATORY_CARE_PROVIDER_SITE_OTHER): Payer: 59 | Admitting: Family Medicine

## 2018-09-13 VITALS — BP 150/80 | HR 99 | Temp 99.4°F | Ht 66.0 in | Wt 181.5 lb

## 2018-09-13 DIAGNOSIS — Z23 Encounter for immunization: Secondary | ICD-10-CM

## 2018-09-13 DIAGNOSIS — Z0000 Encounter for general adult medical examination without abnormal findings: Secondary | ICD-10-CM | POA: Diagnosis not present

## 2018-09-13 DIAGNOSIS — I10 Essential (primary) hypertension: Secondary | ICD-10-CM

## 2018-09-13 DIAGNOSIS — E785 Hyperlipidemia, unspecified: Secondary | ICD-10-CM

## 2018-09-13 DIAGNOSIS — R7989 Other specified abnormal findings of blood chemistry: Secondary | ICD-10-CM

## 2018-09-13 DIAGNOSIS — E1165 Type 2 diabetes mellitus with hyperglycemia: Secondary | ICD-10-CM

## 2018-09-13 LAB — HM DIABETES FOOT EXAM

## 2018-09-13 NOTE — Addendum Note (Signed)
Addended by: Carter Kitten on: 09/13/2018 04:08 PM   Modules accepted: Orders

## 2018-09-13 NOTE — Progress Notes (Signed)
Chief Complaint  Patient presents with  . Annual Exam    History of Present Illness: HPI   The patient is here for annual wellness exam and preventative care.    Doing well overall.  She does have some issues falling asleep at ni=ght.. has been using tylenol PM nightly in last couple of weeks. Denies depression and anxiety.  less physically active.  Hypertension:   .Poor control on lisinopril  BP Readings from Last 3 Encounters:  09/13/18 (!) 150/80  04/07/18 118/60  12/28/17 128/70  Using medication without problems or lightheadedness: none Chest pain with exertion:none Edema:none Short of breath:none Average home BPs: Other issues:  Diabetes:  Very poor control on insulin 45 units daily and metformin, amaryl...followed by ENDO Lab Results  Component Value Date   HGBA1C 8.2 (H) 09/09/2018  Using medications without difficulties: Hypoglycemic episodes:none Hyperglycemic episodes:yes Feet problems: no uclers Blood Sugars averaging: FBS 100-110, 2 hours postprandial: 170 eye exam within last year: overdue  Elevated Cholesterol:  LDL at goal < 100 on pravastatin Lab Results  Component Value Date   CHOL 155 09/09/2018   HDL 44.30 09/09/2018   LDLCALC 85 09/09/2018   TRIG 132.0 09/09/2018   CHOLHDL 4 09/09/2018  Using medications without problems: Muscle aches: none Diet compliance: moderate Exercise: minimal, not working right now. Other complaints:   COVID 19 screen No recent travel or known exposure to [place] The patient denies respiratory symptoms of COVID 19 at this time.  The importance of social distancing was discussed today.   ROS    Past Medical History:  Diagnosis Date  . Allergy   . Anxiety   . Closed fracture of lateral portion of right tibial plateau 07/30/2017  . Depression   . Diabetes mellitus   . H/O renal calculi 10/08/09  . Hyperlipidemia   . Hypertension   . Serum calcium elevated   . STD (sexually transmitted disease)    HSV I &  II, IgG reflex positive  . Tibial plateau fracture, right     reports that she has never smoked. She has never used smokeless tobacco. She reports current alcohol use. She reports that she does not use drugs.   Current Outpatient Medications:  .  alendronate (FOSAMAX) 70 MG tablet, Take 70 mg by mouth once a week. , Disp: , Rfl:  .  aspirin 81 MG tablet, Take 81 mg by mouth daily.  , Disp: , Rfl:  .  glimepiride (AMARYL) 2 MG tablet, Take 2 mg by mouth daily with breakfast., Disp: , Rfl:  .  glucose blood (ONETOUCH VERIO) test strip, Use to check blood sugar three times  daily.  Dx: E11.65, Disp: 200 each, Rfl: 3 .  Insulin Glargine (BASAGLAR KWIKPEN) 100 UNIT/ML SOPN, INJECT 45 UNITS IN THE SKIN DAILY AT 10PM. IF FASTING BLOOD SUGAR >120 X2 DAYS, INCREASE BY 2 UNITS, Disp: 15 mL, Rfl: 11 .  Insulin Pen Needle (B-D ULTRAFINE III SHORT PEN) 31G X 8 MM MISC, USE TO INJECT LEVEMIR DAILY, Disp: 100 each, Rfl: 3 .  Lancet Devices (ONE TOUCH DELICA LANCING DEV) MISC, Check blood sugar twice a day and as directed. Dx E11.65, Disp: 1 each, Rfl: 0 .  latanoprost (XALATAN) 0.005 % ophthalmic solution, Place 1 drop into both eyes at bedtime. , Disp: , Rfl:  .  lisinopril (PRINIVIL,ZESTRIL) 40 MG tablet, TAKE 1 TABLET BY MOUTH EVERY DAY, Disp: 90 tablet, Rfl: 1 .  metFORMIN (GLUCOPHAGE-XR) 500 MG 24 hr tablet, TAKE TWO (2)  TABLETS BY MOUTH 2 TIMES DAILY, Disp: 360 tablet, Rfl: 3 .  pravastatin (PRAVACHOL) 40 MG tablet, TAKE 1 TABLET BY MOUTH EVERY DAY, Disp: 90 tablet, Rfl: 1 .  sertraline (ZOLOFT) 25 MG tablet, TAKE 1 TABLET BY MOUTH EVERY DAY, Disp: 90 tablet, Rfl: 1 .  valACYclovir (VALTREX) 500 MG tablet, Take 1 tablet (500 mg total) by mouth 2 (two) times daily. Take for 3 days for acute flare, Disp: 60 tablet, Rfl: 6   Observations/Objective: Blood pressure (!) 150/80, pulse 99, temperature 99.4 F (37.4 C), temperature source Temporal, height 5\' 6"  (1.676 m), weight 181 lb 8 oz (82.3 kg), last  menstrual period 02/02/2006, SpO2 96 %.  Physical Exam Constitutional:      General: She is not in acute distress.    Appearance: Normal appearance. She is well-developed. She is not ill-appearing or toxic-appearing.  HENT:     Head: Normocephalic.     Right Ear: Hearing, tympanic membrane, ear canal and external ear normal. Tympanic membrane is not erythematous, retracted or bulging.     Left Ear: Hearing, tympanic membrane, ear canal and external ear normal. Tympanic membrane is not erythematous, retracted or bulging.     Nose: No mucosal edema or rhinorrhea.     Right Sinus: No maxillary sinus tenderness or frontal sinus tenderness.     Left Sinus: No maxillary sinus tenderness or frontal sinus tenderness.     Mouth/Throat:     Pharynx: Uvula midline.  Eyes:     General: Lids are normal. Lids are everted, no foreign bodies appreciated.     Conjunctiva/sclera: Conjunctivae normal.     Pupils: Pupils are equal, round, and reactive to light.  Neck:     Musculoskeletal: Normal range of motion and neck supple.     Thyroid: No thyroid mass or thyromegaly.     Vascular: No carotid bruit.     Trachea: Trachea normal.  Cardiovascular:     Rate and Rhythm: Normal rate and regular rhythm.     Pulses: Normal pulses.     Heart sounds: Normal heart sounds, S1 normal and S2 normal. No murmur. No friction rub. No gallop.   Pulmonary:     Effort: Pulmonary effort is normal. No tachypnea or respiratory distress.     Breath sounds: Normal breath sounds. No decreased breath sounds, wheezing, rhonchi or rales.  Abdominal:     General: Bowel sounds are normal.     Palpations: Abdomen is soft.     Tenderness: There is no abdominal tenderness.  Skin:    General: Skin is warm and dry.     Findings: No rash.  Neurological:     Mental Status: She is alert.  Psychiatric:        Mood and Affect: Mood is not anxious or depressed.        Speech: Speech normal.        Behavior: Behavior normal.  Behavior is cooperative.        Thought Content: Thought content normal.        Judgment: Judgment normal.     Diabetic foot exam: Normal inspection No skin breakdown No calluses  Normal DP pulses Normal sensation to light touch and monofilament Nails normal  Assessment and Plan   The patient's preventative maintenance and recommended screening tests for an annual wellness exam were reviewed in full today. Brought up to date unless services declined.  Counselled on the importance of diet, exercise, and its role in overall health and mortality.  The patient's FH and SH was reviewed, including their home life, tobacco status, and drug and alcohol status.   Vaccines: Due for td and PNA13, considering shingles Pap/DVE:  GYN Mammo: GYN Bone Density: BALAN.Marland Kitchen on fosamax Colon: 2009 repeat in 10 years. Due.  Smoking Status: none ETOH/ drug ZSW:FUXN/ATFT  Hep C: done  HIV screen:  refused     Eliezer Lofts, MD

## 2018-09-13 NOTE — Assessment & Plan Note (Signed)
Followed BY ENDO.

## 2018-09-13 NOTE — Patient Instructions (Addendum)
Set up yearly eye exam.  Drink water with meals. Increase exercise.  Follow BP at home.. call if greater than 140/90  Call to set up colonoscopy evaluation.

## 2018-09-13 NOTE — Assessment & Plan Note (Signed)
Resolved

## 2018-09-13 NOTE — Assessment & Plan Note (Signed)
Well controlled. Continue current medication.  

## 2018-09-13 NOTE — Assessment & Plan Note (Signed)
HAd been improved control at home. She has been under stress from her work issues.

## 2018-09-29 ENCOUNTER — Other Ambulatory Visit: Payer: Self-pay | Admitting: *Deleted

## 2018-09-29 ENCOUNTER — Other Ambulatory Visit: Payer: Self-pay | Admitting: Family Medicine

## 2018-10-07 ENCOUNTER — Telehealth: Payer: Self-pay | Admitting: Obstetrics and Gynecology

## 2018-10-07 NOTE — Telephone Encounter (Signed)
Patient will return call to office to reschedule aex that was cancelled because of provider schedule.

## 2018-10-20 DIAGNOSIS — I10 Essential (primary) hypertension: Secondary | ICD-10-CM

## 2018-10-20 MED ORDER — LISINOPRIL 40 MG PO TABS: 40.0000 mg | ORAL_TABLET | Freq: Every day | ORAL | 3 refills | Status: DC

## 2018-10-20 MED ORDER — LANTUS SOLOSTAR 100 UNIT/ML ~~LOC~~ SOPN: PEN_INJECTOR | SUBCUTANEOUS | 3 refills | Status: DC

## 2018-10-21 ENCOUNTER — Other Ambulatory Visit: Payer: Self-pay | Admitting: Family Medicine

## 2018-10-21 DIAGNOSIS — A6009 Herpesviral infection of other urogenital tract: Secondary | ICD-10-CM

## 2018-10-21 NOTE — Telephone Encounter (Signed)
Last office visit 09/13/2018 for CPE.  Last refilled 01/06/2017 for #60 with 6 refills.  Next Appt: 12/16/2018 for 3 month follow up.

## 2018-10-30 ENCOUNTER — Telehealth: Payer: 59 | Admitting: Family

## 2018-10-30 DIAGNOSIS — J029 Acute pharyngitis, unspecified: Secondary | ICD-10-CM

## 2018-10-30 MED ORDER — FLUTICASONE PROPIONATE 50 MCG/ACT NA SUSP: 2.0000 | Freq: Every day | NASAL | 6 refills | Status: DC

## 2018-10-30 MED ORDER — CETIRIZINE HCL 10 MG PO TABS: 10.0000 mg | ORAL_TABLET | Freq: Every day | ORAL | 11 refills | Status: AC

## 2018-10-30 NOTE — Progress Notes (Signed)
We are sorry that you are not feeling well.  Here is how we plan to help!  Your symptoms indicate a likely viral infection (Pharyngitis).   Pharyngitis is inflammation in the back of the throat which can cause a sore throat, scratchiness and sometimes difficulty swallowing.   Pharyngitis is typically caused by a respiratory virus and will just run its course.  Please keep in mind that your symptoms could last up to 10 days.  For throat pain, we recommend over the counter oral pain relief medications such as acetaminophen or aspirin, or anti-inflammatory medications such as ibuprofen or naproxen sodium.  Topical treatments such as oral throat lozenges or sprays may be used as needed.  Avoid close contact with loved ones, especially the very young and elderly.  Remember to wash your hands thoroughly throughout the day as this is the number one way to prevent the spread of infection and wipe down door knobs and counters with disinfectant.  It can also be caused by a post-nasal drip from allergies. I have sent a prescription of flonase, nasal spray that you will use two sprays in each nostril daily. I also recommend starting a daily zyrtec.   Approximately 5 minutes was spent documenting and reviewing patient's chart.   After careful review of your answers, I would not recommend and antibiotic for your condition.  Antibiotics should not be used to treat conditions that we suspect are caused by viruses like the virus that causes the common cold or flu. However, some people can have Strep with atypical symptoms. You may need formal testing in clinic or office to confirm if your symptoms continue or worsen.  Providers prescribe antibiotics to treat infections caused by bacteria. Antibiotics are very powerful in treating bacterial infections when they are used properly.  To maintain their effectiveness, they should be used only when necessary.  Overuse of antibiotics has resulted in the development of super bugs  that are resistant to treatment!    Home Care:  Only take medications as instructed by your medical team.  Do not drink alcohol while taking these medications.  A steam or ultrasonic humidifier can help congestion.  You can place a towel over your head and breathe in the steam from hot water coming from a faucet.  Avoid close contacts especially the very young and the elderly.  Cover your mouth when you cough or sneeze.  Always remember to wash your hands.  Get Help Right Away If:  You develop worsening fever or throat pain.  You develop a severe head ache or visual changes.  Your symptoms persist after you have completed your treatment plan.  Make sure you  Understand these instructions.  Will watch your condition.  Will get help right away if you are not doing well or get worse.  Your e-visit answers were reviewed by a board certified advanced clinical practitioner to complete your personal care plan.  Depending on the condition, your plan could have included both over the counter or prescription medications.  If there is a problem please reply  once you have received a response from your provider.  Your safety is important to [place].  If you have drug allergies check your prescription carefully.    You can use MyChart to ask questions about todays visit, request a non-urgent call back, or ask for a work or school excuse for 24 hours related to this e-Visit. If it has been greater than 24 hours you will need to follow up with  your provider, or enter a new e-Visit to address those concerns.  You will get an e-mail in the next two days asking about your experience.  I hope that your e-visit has been valuable and will speed your recovery. Thank you for using e-visits.

## 2018-10-31 ENCOUNTER — Telehealth: Payer: Self-pay

## 2018-10-31 NOTE — Telephone Encounter (Signed)
[facility] Night - Client TELEPHONE ADVICE RECORD [facility] Patient Name: Jean Robinson Gender: Female DOB: 04-02-1955 Age: 63 Y 2 M 17 D Return Phone Number: [phone] (Primary) Address: City/State/Zip: [place] [address] Client [facility] - Client Client Site [facility] Physician Eliezer Lofts - MD Contact Type Call Who Is Calling Patient / Member / Family / Caregiver Call Type Triage / Clinical Relationship To Patient Self Return Phone Number [phone] (Primary) Chief Complaint Unclassified Symptom Reason for Call Symptomatic / Request for Jean Robinson states that her throat feels unusual in one spot. States that it is hard to explain but doesn't feel like there is anything lodged in her throat and it doesn't feel like a sore throat. States that she has surgery on Tuesday and doesn't want to be sick. No other sx. Translation No Nurse Assessment Nurse: Cain Sieve, RN, Clarise Cruz Date/Time (Eastern Time): 10/30/2018 8:55:16 AM Confirm and document reason for call. If symptomatic, describe symptoms. ---Caller states that her throat feels unusual in one spot staring last night at about 2030. States that it is hard to explain but doesn't feel like there is anything lodged in her throat and it doesn't feel like a sore throat. States that she has surgery on Tuesday and doesn't want to be sick. No other sx. Had husband look in mouth and left side of mouth looks raw. Not taken any medicine for pain. No fever. Pain does not increase with swallowing. Pain 2-3/10. Has the patient had close contact with a person known or suspected to have the novel coronavirus illness OR traveled / lives in area with major community spread (including international travel) in the last 14 days from the onset of symptoms? * If Asymptomatic, screen for exposure and travel within the last 14 days. ---No Does  the patient have any new or worsening symptoms? ---Yes Will a triage be completed? ---Yes Related visit to physician within the last 2 weeks? ---Yes Does the PT have any chronic conditions? (i.e. diabetes, asthma, this includes High risk factors for pregnancy, etc.) ---Yes List chronic conditions. ---cataracts, diabetic, HTN Is this a behavioral health or substance abuse call? ---No PLEASE NOTE: All timestamps contained within this report are represented as [place] Standard Time. CONFIDENTIALTY NOTICE: This fax transmission is intended only for the addressee. It contains information that is legally privileged, confidential or otherwise protected from use or disclosure. If you are not the intended recipient, you are strictly prohibited from reviewing, disclosing, copying using or disseminating any of this information or taking any action in reliance on or regarding this information. If you have received this fax in error, please notify us immediately by telephone so that we can arrange for its return to [place]. Phone: [phone], Toll-Free: [phone], Fax: [phone] Page: 2 of 2 Call Id: [id number] Guidelines Guideline Title Affirmed Question Affirmed Notes Nurse Date/Time Eilene Ghazi Time) Sore Throat Diabetes mellitus or weak immune system (e.g., HIV positive, cancer chemo, splenectomy, organ transplant, chronic steroids) Cumesty, RN, Clarise Cruz 10/30/2018 8:59:08 AM Disp. Time Eilene Ghazi Time) Disposition Final User 10/30/2018 9:07:01 AM See PCP within 24 Hours Yes Cumesty, RN, Margurite Auerbach Disagree/Comply Comply Caller Understands Yes PreDisposition Did not know what to do Care Advice Given Per Guideline SEE PCP WITHIN 24 HOURS: * IF OFFICE WILL BE OPEN: You need to be seen within the next 24 hours. Call your doctor (or NP/PA) when the office opens and make an appointment. SORE THROAT -  For relief of sore throat: * Sip warm chicken broth or apple juice. * Gargle with warm salt water four  times a day. To make salt water, put 1/2 teaspoon of salt in 8 oz (240 ml) of warm water. PAIN OR FEVER MEDICINES: * For pain and fever relief, take acetaminophen or ibuprofen. ACETAMINOPHEN (E.G., TYLENOL): * Take 650 mg (two 325 mg pills) by mouth every 4-6 hours as needed. Each Regular Strength Tylenol pill has 325 mg of acetaminophen. The most you should take each day is 3,250 mg (10 Regular Strength pills a day). SOFT DIET: * Eat a soft diet. * Cold drinks, popsicles, and milk shakes are especially good. Avoid citrus fruits. CALL BACK IF: * You become worse. CARE ADVICE given per Sore Throat (Adult) guideline. After Care Instructions Given Call Event Type User Date / Time Description Education document email Butler, RN, Clarise Cruz 10/30/2018 9:05:46 AM [facility] Instructions Referrals REFERRED TO PCP OFFICE

## 2018-10-31 NOTE — Telephone Encounter (Signed)
Per chart review tab pt had evisit on 10/30/18.

## 2018-11-04 ENCOUNTER — Ambulatory Visit: Payer: BLUE CROSS/BLUE SHIELD | Admitting: Obstetrics and Gynecology

## 2018-11-07 ENCOUNTER — Other Ambulatory Visit: Payer: Self-pay | Admitting: Family Medicine

## 2018-11-07 DIAGNOSIS — I10 Essential (primary) hypertension: Secondary | ICD-10-CM

## 2018-11-09 NOTE — Telephone Encounter (Signed)
Routing to PCP

## 2018-12-08 ENCOUNTER — Telehealth: Payer: Self-pay | Admitting: Family Medicine

## 2018-12-08 DIAGNOSIS — E1165 Type 2 diabetes mellitus with hyperglycemia: Secondary | ICD-10-CM

## 2018-12-08 NOTE — Telephone Encounter (Signed)
-----   Message from Ellamae Sia sent at 11/28/2018  2:43 PM EDT ----- Regarding: Lab orders for Friday, 11.6.20 Lab orders for  f/u

## 2018-12-09 ENCOUNTER — Other Ambulatory Visit: Payer: 59

## 2018-12-10 ENCOUNTER — Other Ambulatory Visit: Payer: Self-pay | Admitting: Family Medicine

## 2018-12-16 ENCOUNTER — Ambulatory Visit: Payer: 59 | Admitting: Family Medicine

## 2018-12-23 NOTE — Telephone Encounter (Signed)
Please see the [employer] form in media... print out and re-fax please. Was never received.

## 2019-01-17 ENCOUNTER — Other Ambulatory Visit: Payer: Self-pay | Admitting: Family Medicine

## 2019-01-23 ENCOUNTER — Other Ambulatory Visit: Payer: Self-pay

## 2019-01-23 NOTE — Patient Outreach (Signed)
  [facility]) Care Management Chronic Special Needs Program    01/23/2019  Name: Jean Robinson, DOB: 07/23/1955  MRN: [id number]   Ms. Magali Amelio is enrolled in a chronic special needs plan for Diabetes. Telephone call to client to compete initial telephone assessment. Unable to reach. HIPAA compliant voice message left with call back phone number and requested return call. Marland Kitchen   PLAN: RNCM will attempt 2nd telephone call to client within 1 week.   Quinn Plowman RN,BSN,CCM [facility] [phone]

## 2019-01-24 ENCOUNTER — Other Ambulatory Visit: Payer: Self-pay

## 2019-01-24 ENCOUNTER — Other Ambulatory Visit: Payer: Self-pay | Admitting: *Deleted

## 2019-01-24 DIAGNOSIS — E1165 Type 2 diabetes mellitus with hyperglycemia: Secondary | ICD-10-CM

## 2019-01-24 MED ORDER — BD PEN NEEDLE SHORT U/F 31G X 8 MM MISC: 3 refills | Status: DC

## 2019-01-24 NOTE — Patient Outreach (Signed)
[facility]) Care Management Chronic Special Needs Program  01/24/2019  Name: Jean Robinson DOB: [birth year]  MRN: [id number]  [name] is enrolled in a chronic special needs plan for Diabetes. Chronic Care Management Coordinator telephoned client to review health risk assessment and to develop individualized care plan.  Introduced the chronic care management program, importance of client participation, and taking their care plan to all provider appointments and inpatient facilities.    Subjective: Client states she is doing well. Client reports she is having trouble with her right achilles tendon. Client states she has pain and swelling in the area if she stands on it for long periods of time. Client states she is using over the counter anti inflammatories, elevation and ice to help relieve pain. She reports the pain  has been manageable but has  limited her activity. Client states she has gain a little weight because of this.   Client states she will notify the doctor of her symptoms after the holidays.  Client states her last A1c was 8.2. She reports she has not been eating the best over the holiday. She states she's been baking a lot. Client states she knows how to get back on tract and is working toward this for the first of the year.  RNCM discussed Advanced care planning with patient. Client verbally agreed to receive Advanced care plan packet.  Advised client that a Education officer, museum can be referred if assistance is needed. Client verbalized understanding.   Goals Addressed            This Visit's Progress   .  Acknowledge receipt of Advanced Directive package       Review information and contact me at [phone] if you have questions.  A social worker can by provided through [facility] if you need assistance with the Advanced Directive.     . Client understands the importance of follow-up with providers by attending scheduled visits       Client reports  adherence to provider appointments. Per review of medical records client has completed 3 office visits with primary care provider:  09/13/18, 07/21/18, 04/07/18.    Marland Kitchen Client will verbalize knowledge of self management of Hypertension as evidences by BP reading of 140/90 or less; or as defined by provider       Assessed Hypertension self management skills. RNCM advised client to obtain home blood pressure monitor for weekly monitoring. Discussed blood pressure target with client.  Mailed client EMMI education article on hypertension.     Marland Kitchen HEMOGLOBIN A1C < 7.0       Discussed Hgb A1c target.  Client most recent A1c 8.2 Discussed strategies to improve Hgb A1c:  Monitoring/ decreasing carbohydrate intake, continuing to take diabetic medication as prescribed, physical activity, blood sugar monitoring.    . Maintain timely refills of diabetic medication as prescribed within the year .       Client reports taking her medications as prescribed by her doctor    . Obtain annual  Lipid Profile, LDL-C       Per medical record review Lipid profile completed on 09/09/18    . Obtain Annual Eye (retinal)  Exam        Client  states annual eye exam was 04/01/18.    . Obtain Annual Foot Exam       Client states her provider checks her feet at each office visit. Client reports annual diabetic foot exam was 09/13/18    .  Obtain annual screen for micro albuminuria (urine) , nephropathy (kidney problems)      . Obtain Hemoglobin A1C at least 2 times per year       Per medical record review Hgb A1c completed on 09/09/18 and 04/07/18    . Visit Primary Care Provider or Endocrinologist at least 2 times per year        Client reports adherence to provider appointments.   Client saw her primary MD on 09/13/18, 07/21/18, 04/07/18 Client saw endocrinologist on 04/07/18.  Client reports she has been cleared by her endocrinologist and will continue to follow up with her primary MD for diabetes monitoring.       ASSESSMENT;  Assessment: Client is not meeting diabetes self-management goal of hemoglobin A1C of <7% with most recent reading of 8.2% on 8/720 without reports of hypoglycemia . RNCM discussed strategies for improving A1c and blood sugar: Continue taking diabetic medication as prescribed, Carbohydrate controlled meal planning, increase physical activity, and ongoing blood sugar monitoring.  Client has good understanding of:  COVID-19 cause, symptoms, precautions (social distancing, stay at home order, hand washing, wear face covering when unable to maintain or ensure 6 foot social distancing), and symptoms requiring provider notification. RNCM advised client to notify MD of any changes in condition prior to scheduled appointment. Client advised to contact RNCM as needed and contact their HTA concierge for benefit questions.  RNCM provided client 24 hour HTA nurse advise line number [phone]  [facility] verified client aware of 911 services for urgent/ emergent needs.   Plan:  Send successful outreach letter with a copy of their individualized care plan, Send individual care plan to provider and Send educational material  Chronic care management coordination will outreach in:  6 Months    Quinn Plowman RN,BSN,CCM [facility] [phone]

## 2019-01-25 ENCOUNTER — Ambulatory Visit: Payer: Self-pay

## 2019-02-03 DIAGNOSIS — U071 COVID-19: Secondary | ICD-10-CM

## 2019-02-03 HISTORY — DX: COVID-19: U07.1

## 2019-03-01 ENCOUNTER — Other Ambulatory Visit: Payer: Self-pay | Admitting: Family Medicine

## 2019-03-10 ENCOUNTER — Other Ambulatory Visit (INDEPENDENT_AMBULATORY_CARE_PROVIDER_SITE_OTHER): Payer: Self-pay

## 2019-03-10 ENCOUNTER — Other Ambulatory Visit: Payer: Self-pay

## 2019-03-10 DIAGNOSIS — E1165 Type 2 diabetes mellitus with hyperglycemia: Secondary | ICD-10-CM

## 2019-03-10 LAB — POCT GLYCOSYLATED HEMOGLOBIN (HGB A1C): Hemoglobin A1C: 7.3 % — AB (ref 4.0–5.6)

## 2019-03-10 NOTE — Progress Notes (Signed)
No critical labs need to be addressed urgently. We will discuss labs in detail at upcoming office visit.   

## 2019-03-13 ENCOUNTER — Other Ambulatory Visit: Payer: Self-pay

## 2019-03-13 LAB — HM DIABETES FOOT EXAM

## 2019-03-14 ENCOUNTER — Other Ambulatory Visit: Payer: Self-pay

## 2019-03-14 DIAGNOSIS — H401131 Primary open-angle glaucoma, bilateral, mild stage: Secondary | ICD-10-CM | POA: Diagnosis not present

## 2019-03-14 NOTE — Progress Notes (Signed)
GYNECOLOGY  VISIT   HPI: 64 y.o.   Married  Caucasian  female   [phone] with Patient's last menstrual period was 02/02/2006.   here for LLQ pain. Patient denies any urinary symptoms.  Had LLQ pain 3 - 4 times in the last couple of months. States she has pain in her left lower quadrant near her ovary.  It is short lived and feels like a twinge like she is ovulating.  Not associated with anything.   Patient does have uterovaginal prolapse.  Not having problems with urinary frequency or leakage of urine.  Having BM every am, and this is consistent.  Not interested in treatment for this.   Denies blood in her urine.  She does not have her usual flank pain she gets with a renal stone.   Denies vaginal bleeding.  Has vaginal dryness.   Has retired.   GYNECOLOGIC HISTORY: Patient's last menstrual period was 02/02/2006. Contraception:  Postmenopausal Menopausal hormone therapy:  None Last mammogram:  10-06-17 3D/Neg/density A/BiRads1 Last pap smear: 09/24/2015 Neg:Neg HR HPV, 02-09-12 Neg:Neg HR HPV        OB History    Gravida  4   Para  4   Term  4   Preterm  0   AB  0   Living  4     SAB  0   TAB  0   Ectopic  0   Multiple  0   Live Births  4              Patient Active Problem List   Diagnosis Date Noted  . Advice given about COVID-19 virus infection 07/21/2018  . Pyelonephritis 12/28/2017  . Low back pain 12/08/2017  . High serum calcium 10/01/2017  . Closed fracture of lateral portion of right tibial plateau 07/30/2017  . OA (osteoarthritis) of finger 03/05/2017  . Trigger finger, right middle finger 08/11/2016  . Genital herpes 10/10/2015  . Diabetes type 2, uncontrolled ([place])   . DEPRESSION, MILD 02/27/2008  . Hyperlipidemia LDL goal <100 11/18/2006  . Essential hypertension, benign 11/18/2006  . ALLERGIC RHINITIS 11/18/2006    Past Medical History:  Diagnosis Date  . Allergy   . Anxiety   . Closed fracture of lateral portion of right  tibial plateau 07/30/2017  . Depression   . Diabetes mellitus   . H/O renal calculi 10/08/09  . Hyperlipidemia   . Hypertension   . Serum calcium elevated   . STD (sexually transmitted disease)    HSV I & II, IgG reflex positive  . Tibial plateau fracture, right     Past Surgical History:  Procedure Laterality Date  . APPENDECTOMY    . CHOLECYSTECTOMY    . ORIF TIBIA PLATEAU Right 07/30/2017   Procedure: OPEN REDUCTION INTERNAL FIXATION (ORIF) TIBIAL PLATEAU;  Surgeon: Marchia Bond, MD;  Location: [facility];  Service: Orthopedics;  Laterality: Right;  . TONSILLECTOMY      Current Outpatient Medications  Medication Sig Dispense Refill  . alendronate (FOSAMAX) 70 MG tablet Take 70 mg by mouth once a week.     Marland Kitchen aspirin 81 MG tablet Take 81 mg by mouth daily.      . cetirizine (ZYRTEC) 10 MG tablet Take 1 tablet (10 mg total) by mouth daily. 30 tablet 11  . fluticasone (FLONASE) 50 MCG/ACT nasal spray Place 2 sprays into both nostrils daily. 16 g 6  . glimepiride (AMARYL) 2 MG tablet Take 2 mg by mouth daily with  breakfast.    . Insulin Glargine (LANTUS SOLOSTAR) 100 UNIT/ML Solostar Pen INJECT 45 UNITS IN THE SKIN DAILY AT 10PM. IF FASTING BLOOD SUGAR >120 X2 DAYS, INCREASE BY 2 UNITS 45 mL 3  . Insulin Pen Needle (B-D ULTRAFINE III SHORT PEN) 31G X 8 MM MISC USE TO INJECT LEVEMIR DAILY 100 each 3  . Lancet Devices (ONE TOUCH DELICA LANCING DEV) MISC Check blood sugar twice a day and as directed. Dx E11.65 1 each 0  . latanoprost (XALATAN) 0.005 % ophthalmic solution Place 1 drop into both eyes at bedtime.     Marland Kitchen lisinopril (ZESTRIL) 40 MG tablet Take 1 tablet (40 mg total) by mouth daily. 90 tablet 3  . metFORMIN (GLUCOPHAGE-XR) 500 MG 24 hr tablet TAKE TWO (2) TABLETS BY MOUTH 2 TIMES DAILY 360 tablet 3  . ONETOUCH VERIO test strip USE TO CHECK BLOOD SUGAR THREE TIMES DAILY. DX: E11.65 100 strip 11  . pravastatin (PRAVACHOL) 40 MG tablet TAKE 1 TABLET BY MOUTH EVERY  DAY 90 tablet 2  . RESTASIS 0.05 % ophthalmic emulsion INSTILL 1 DROP INTO BOTH EYES TWICE A DAY INSTILL 1 DROP INTO BOTH EYES TWICE DAILY    . sertraline (ZOLOFT) 25 MG tablet TAKE 1 TABLET BY MOUTH EVERY DAY 90 tablet 1  . SYSTANE ULTRA 0.4-0.3 % SOLN SMARTSIG:1 Drop(s) In Eye(s) As Needed    . valACYclovir (VALTREX) 500 MG tablet TAKE 1 TABLET BY MOUTH 2 TIMES DAILY, TAKE FOR 3 DAYS FOR ACUTE FLARE 60 tablet 6   No current facility-administered medications for this visit.     ALLERGIES: Patient has no known allergies.  Family History  Problem Relation Age of Onset  . Depression Father   . Diabetes Father   . COPD Mother   . Diabetes Mother   . Macular degeneration Mother   . Diabetes Brother   . Cancer Other        liver  . Diabetes Other     Social History   Socioeconomic History  . Marital status: Married    Spouse name: Not on file  . Number of children: 4  . Years of education: Not on file  . Highest education level: Not on file  Occupational History  . Occupation: [employer]    Employer: [employer]  Tobacco Use  . Smoking status: Never Smoker  . Smokeless tobacco: Never Used  Substance and Sexual Activity  . Alcohol use: Yes    Alcohol/week: 0.0 - 2.0 standard drinks    Comment: social  . Drug use: No  . Sexual activity: Yes    Partners: Male    Birth control/protection: Post-menopausal  Other Topics Concern  . Not on file  Social History Narrative   Regular exercise --yes, treadmill 15 min 3 days per week    Originally from [place] and moved here 30 years ago and went to college at [employer] with a degree in the child education   Occasional drinker and nonsmoker   Has 4 children   Lives with her second husband   Social Determinants of Radio broadcast assistant Strain:   . Difficulty of Paying Living Expenses: Not on file  Food Insecurity: No Food Insecurity  . Worried About Charity fundraiser in the Last Year: Never true  .  Ran Out of Food in the Last Year: Never true  Transportation Needs: No Transportation Needs  . Lack of Transportation (Medical): No  . Lack of Transportation (  Non-Medical): No  Physical Activity:   . Days of Exercise per Week: Not on file  . Minutes of Exercise per Session: Not on file  Stress:   . Feeling of Stress : Not on file  Social Connections:   . Frequency of Communication with Friends and Family: Not on file  . Frequency of Social Gatherings with Friends and Family: Not on file  . Attends Religious Services: Not on file  . Active Member of Clubs or Organizations: Not on file  . Attends Archivist Meetings: Not on file  . Marital Status: Not on file  Intimate Partner Violence:   . Fear of Current or Ex-Partner: Not on file  . Emotionally Abused: Not on file  . Physically Abused: Not on file  . Sexually Abused: Not on file    Review of Systems  Genitourinary: Positive for pelvic pain (LLQ).  All other systems reviewed and are negative.   PHYSICAL EXAMINATION:    BP 140/68 (Cuff Size: Large)   Pulse 80   Temp (!) 97 F (36.1 C) (Temporal)   Ht 5\' 6"  (1.676 m)   Wt 182 lb 9.6 oz (82.8 kg)   LMP 02/02/2006   BMI 29.47 kg/m     General appearance: alert, cooperative and appears stated age Head: Normocephalic, without obvious abnormality, atraumatic Neck: no adenopathy, supple, symmetrical, trachea midline and thyroid normal to inspection and palpation Lungs: clear to auscultation bilaterally Breasts: normal appearance, no masses or tenderness, No nipple retraction or dimpling, No nipple discharge or bleeding, No axillary or supraclavicular adenopathy Heart: regular rate and rhythm Abdomen: soft, non-tender, no masses,  no organomegaly Extremities: extremities normal, atraumatic, no cyanosis or edema Skin: Skin color, texture, turgor normal. No rashes or lesions Lymph nodes: Cervical, supraclavicular, and axillary nodes normal. No abnormal inguinal nodes  palpated Neurologic: Grossly normal  Pelvic: External genitalia:  no lesions              Urethra:  normal appearing urethra with no masses, tenderness or lesions              Bartholins and Skenes: normal                 Vagina: normal appearing vagina with normal color and discharge, no lesions.  Third degree cystocele, first degree uterine prolapse, minimal rectocele.              Cervix: no lesions                Bimanual Exam:  Uterus:  normal size, contour, position, consistency, mobility, non-tender              Adnexa: no mass, fullness, tenderness              Rectal exam: Yes.  .  Confirms.              Anus:  normal sphincter tone, no lesions  Chaperone was present for exam.  ASSESSMENT  LLQ pain.  ? etiology Third degree cystocele, first degree uterine prolapse, first degree rectocele. Hx renal calculi.   PLAN  We discussed potential cause for LLQ pain - ovarian cyst, constipation or bowel issues, urinary system/renal calculi, musculoskeletal in origin.  Will proceed with pelvic US.  If [place] is normal, then she will see her PCP.  She has an appointment for an annual exam in April, 2021.    An After Visit Summary was printed and given to the patient.  __20____ minutes  face to face time of which over 50% was spent in counseling.

## 2019-03-15 ENCOUNTER — Encounter: Payer: Self-pay | Admitting: Obstetrics and Gynecology

## 2019-03-15 ENCOUNTER — Ambulatory Visit: Payer: HMO | Admitting: Obstetrics and Gynecology

## 2019-03-15 ENCOUNTER — Other Ambulatory Visit: Payer: Self-pay | Admitting: *Deleted

## 2019-03-15 VITALS — BP 140/68 | HR 80 | Temp 97.0°F | Ht 66.0 in | Wt 182.6 lb

## 2019-03-15 DIAGNOSIS — R1032 Left lower quadrant pain: Secondary | ICD-10-CM

## 2019-03-15 DIAGNOSIS — I10 Essential (primary) hypertension: Secondary | ICD-10-CM

## 2019-03-15 MED ORDER — LISINOPRIL 40 MG PO TABS: 40.0000 mg | ORAL_TABLET | Freq: Every day | ORAL | 1 refills | Status: DC

## 2019-03-17 ENCOUNTER — Other Ambulatory Visit: Payer: Self-pay

## 2019-03-17 ENCOUNTER — Ambulatory Visit (INDEPENDENT_AMBULATORY_CARE_PROVIDER_SITE_OTHER): Payer: HMO | Admitting: Family Medicine

## 2019-03-17 ENCOUNTER — Encounter: Payer: Self-pay | Admitting: Family Medicine

## 2019-03-17 VITALS — BP 130/70 | HR 77 | Temp 98.1°F | Ht 66.0 in | Wt 181.5 lb

## 2019-03-17 DIAGNOSIS — I10 Essential (primary) hypertension: Secondary | ICD-10-CM | POA: Diagnosis not present

## 2019-03-17 DIAGNOSIS — E1165 Type 2 diabetes mellitus with hyperglycemia: Secondary | ICD-10-CM

## 2019-03-17 DIAGNOSIS — M67971 Unspecified disorder of synovium and tendon, right ankle and foot: Secondary | ICD-10-CM

## 2019-03-17 DIAGNOSIS — T753XXA Motion sickness, initial encounter: Secondary | ICD-10-CM | POA: Diagnosis not present

## 2019-03-17 DIAGNOSIS — E1159 Type 2 diabetes mellitus with other circulatory complications: Secondary | ICD-10-CM | POA: Diagnosis not present

## 2019-03-17 DIAGNOSIS — I152 Hypertension secondary to endocrine disorders: Secondary | ICD-10-CM

## 2019-03-17 MED ORDER — SCOPOLAMINE 1 MG/3DAYS TD PT72: 1.0000 | MEDICATED_PATCH | TRANSDERMAL | 0 refills | Status: DC

## 2019-03-17 NOTE — Patient Instructions (Addendum)
Move insulin to dinnertime.. continue 35 Units daily.  If sugars still running low, can try holding glimperide.

## 2019-03-17 NOTE — Progress Notes (Signed)
Chief Complaint  Patient presents with  . Diabetes    History of Present Illness: HPI  64 year old female presents for follow up DM  Diabetes:   Improved control on 35 units Levemir insulin ( occ taking additional 10 units in day if eats more carbs, takes about 3 days a week), amaryl and metformin.  She is no longer seeing ENDO  For this issue.  she has started NOOM in last 1-2 months.. she has lost 9 lbs. Lab Results  Component Value Date   HGBA1C 7.3 (A) 03/10/2019  Using medications without difficulties: Hypoglycemic episodes:  Several times at bedtime 70-80.Marland Kitchen eats then to not drop low overnight. Hyperglycemic episodes: Feet problems: Blood Sugars averaging: FBS < 120 eye exam within last year:due   BP at goal on lisinopril BP Readings from Last 3 Encounters:  03/17/19 130/70  03/15/19 140/68  09/13/18 (!) 150/80   Wt Readings from Last 3 Encounters:  03/17/19 181 lb 8 oz (82.3 kg)  03/15/19 182 lb 9.6 oz (82.8 kg)  09/13/18 181 lb 8 oz (82.3 kg)    Swelling and pain in right posterior ankle x several weeks. Sore after walking/standing on feet.   This visit occurred during the SARS-CoV-2 public health emergency.  Safety protocols were in place, including screening questions prior to the visit, additional usage of staff PPE, and extensive cleaning of exam room while observing appropriate contact time as indicated for disinfecting solutions.   COVID 19 screen:  No recent travel or known exposure to COVID19 The patient denies respiratory symptoms of COVID 19 at this time. The importance of social distancing was discussed today.     Review of Systems  Constitutional: Negative for chills and fever.  HENT: Negative for congestion and ear pain.   Eyes: Negative for pain and redness.  Respiratory: Negative for cough and shortness of breath.   Cardiovascular: Negative for chest pain, palpitations and leg swelling.  Gastrointestinal: Negative for abdominal pain, blood  in stool, constipation, diarrhea, nausea and vomiting.  Genitourinary: Negative for dysuria.  Musculoskeletal: Negative for falls and myalgias.  Skin: Negative for rash.  Neurological: Negative for dizziness.  Psychiatric/Behavioral: Negative for depression. The patient is not nervous/anxious.       Past Medical History:  Diagnosis Date  . Allergy   . Anxiety   . Closed fracture of lateral portion of right tibial plateau 07/30/2017  . Depression   . Diabetes mellitus   . H/O renal calculi 10/08/09  . Hyperlipidemia   . Hypertension   . Serum calcium elevated   . STD (sexually transmitted disease)    HSV I & II, IgG reflex positive  . Tibial plateau fracture, right     reports that she has never smoked. She has never used smokeless tobacco. She reports current alcohol use. She reports that she does not use drugs.   Current Outpatient Medications:  .  alendronate (FOSAMAX) 70 MG tablet, Take 70 mg by mouth once a week. , Disp: , Rfl:  .  aspirin 81 MG tablet, Take 81 mg by mouth daily.  , Disp: , Rfl:  .  cetirizine (ZYRTEC) 10 MG tablet, Take 1 tablet (10 mg total) by mouth daily., Disp: 30 tablet, Rfl: 11 .  fluticasone (FLONASE) 50 MCG/ACT nasal spray, Place 2 sprays into both nostrils daily., Disp: 16 g, Rfl: 6 .  glimepiride (AMARYL) 2 MG tablet, Take 2 mg by mouth daily with breakfast., Disp: , Rfl:  .  Insulin Glargine (LANTUS  SOLOSTAR) 100 UNIT/ML Solostar Pen, INJECT 45 UNITS IN THE SKIN DAILY AT 10PM. IF FASTING BLOOD SUGAR >120 X2 DAYS, INCREASE BY 2 UNITS, Disp: 45 mL, Rfl: 3 .  Insulin Pen Needle (B-D ULTRAFINE III SHORT PEN) 31G X 8 MM MISC, USE TO INJECT LEVEMIR DAILY, Disp: 100 each, Rfl: 3 .  Lancet Devices (ONE TOUCH DELICA LANCING DEV) MISC, Check blood sugar twice a day and as directed. Dx E11.65, Disp: 1 each, Rfl: 0 .  latanoprost (XALATAN) 0.005 % ophthalmic solution, Place 1 drop into both eyes at bedtime. , Disp: , Rfl:  .  lisinopril (ZESTRIL) 40 MG tablet,  Take 1 tablet (40 mg total) by mouth daily., Disp: 90 tablet, Rfl: 1 .  metFORMIN (GLUCOPHAGE-XR) 500 MG 24 hr tablet, TAKE TWO (2) TABLETS BY MOUTH 2 TIMES DAILY, Disp: 360 tablet, Rfl: 3 .  ONETOUCH VERIO test strip, USE TO CHECK BLOOD SUGAR THREE TIMES DAILY. DX: E11.65, Disp: 100 strip, Rfl: 11 .  pravastatin (PRAVACHOL) 40 MG tablet, TAKE 1 TABLET BY MOUTH EVERY DAY, Disp: 90 tablet, Rfl: 2 .  RESTASIS 0.05 % ophthalmic emulsion, INSTILL 1 DROP INTO BOTH EYES TWICE A DAY INSTILL 1 DROP INTO BOTH EYES TWICE DAILY, Disp: , Rfl:  .  sertraline (ZOLOFT) 25 MG tablet, TAKE 1 TABLET BY MOUTH EVERY DAY, Disp: 90 tablet, Rfl: 1 .  SYSTANE ULTRA 0.4-0.3 % SOLN, SMARTSIG:1 Drop(s) In Eye(s) As Needed, Disp: , Rfl:  .  valACYclovir (VALTREX) 500 MG tablet, TAKE 1 TABLET BY MOUTH 2 TIMES DAILY, TAKE FOR 3 DAYS FOR ACUTE FLARE, Disp: 60 tablet, Rfl: 6   Observations/Objective: Blood pressure 130/70, pulse 77, temperature 98.1 F (36.7 C), temperature source Temporal, height 5\' 6"  (1.676 m), weight 181 lb 8 oz (82.3 kg), last menstrual period 02/02/2006, SpO2 97 %.  Physical Exam Constitutional:      General: She is not in acute distress.    Appearance: Normal appearance. She is well-developed. She is not ill-appearing or toxic-appearing.  HENT:     Head: Normocephalic.     Right Ear: Hearing, tympanic membrane, ear canal and external ear normal. Tympanic membrane is not erythematous, retracted or bulging.     Left Ear: Hearing, tympanic membrane, ear canal and external ear normal. Tympanic membrane is not erythematous, retracted or bulging.     Nose: No mucosal edema or rhinorrhea.     Right Sinus: No maxillary sinus tenderness or frontal sinus tenderness.     Left Sinus: No maxillary sinus tenderness or frontal sinus tenderness.     Mouth/Throat:     Pharynx: Uvula midline.  Eyes:     General: Lids are normal. Lids are everted, no foreign bodies appreciated.     Conjunctiva/sclera: Conjunctivae  normal.     Pupils: Pupils are equal, round, and reactive to light.  Neck:     Thyroid: No thyroid mass or thyromegaly.     Vascular: No carotid bruit.     Trachea: Trachea normal.  Cardiovascular:     Rate and Rhythm: Normal rate and regular rhythm.     Pulses: Normal pulses.     Heart sounds: Normal heart sounds, S1 normal and S2 normal. No murmur. No friction rub. No gallop.   Pulmonary:     Effort: Pulmonary effort is normal. No tachypnea or respiratory distress.     Breath sounds: Normal breath sounds. No decreased breath sounds, wheezing, rhonchi or rales.  Abdominal:     General: Bowel sounds are normal.  Palpations: Abdomen is soft.     Tenderness: There is no abdominal tenderness.  Musculoskeletal:     Cervical back: Normal range of motion and neck supple.     Comments:  Swelling/nodule in upper achilles on right, full ROM of ankle  Skin:    General: Skin is warm and dry.     Findings: No rash.  Neurological:     Mental Status: She is alert.  Psychiatric:        Mood and Affect: Mood is not anxious or depressed.        Speech: Speech normal.        Behavior: Behavior normal. Behavior is cooperative.        Thought Content: Thought content normal.        Judgment: Judgment normal.     Diabetic foot exam: Normal inspection No skin breakdown No calluses  Normal DP pulses Normal sensation to light touch and monofilament Nails normal  Assessment and Plan   Uncontrolled type 2 diabetes mellitus with hyperglycemia (HCC) Improving control with lifestyle changes.  Move levemir earlier in day given anxiety about low normal measurements at night.  if continued weight loss and CBGs at goal or below.. consider holding amaryl.  Continue BID metformin.    Essential hypertension, benign Well controlled. Continue current medication. On aceI.  Motion sickness Rx for scopolamine patches for upcoming trip to [facility]  Disorder of right Achilles tendon Recommended  stretching and OICe. Refer to Dr. Lore[facility] for further recs prior to trip to [place].     Eliezer Lofts, MD

## 2019-03-17 NOTE — Assessment & Plan Note (Addendum)
Improving control with lifestyle changes.  Move levemir earlier in day given anxiety about low normal measurements at night.  if continued weight loss and CBGs at goal or below.. consider holding amaryl.  Continue BID metformin.

## 2019-03-17 NOTE — Assessment & Plan Note (Signed)
Well controlled. Continue current medication. On aceI.

## 2019-03-17 NOTE — Assessment & Plan Note (Signed)
Recommended stretching and OICe. Refer to Dr. Lore[facility] for further recs prior to trip to [place].

## 2019-03-17 NOTE — Assessment & Plan Note (Signed)
Rx for scopolamine patches for upcoming trip to [facility]

## 2019-03-20 ENCOUNTER — Ambulatory Visit (INDEPENDENT_AMBULATORY_CARE_PROVIDER_SITE_OTHER): Payer: HMO | Admitting: Family Medicine

## 2019-03-20 ENCOUNTER — Other Ambulatory Visit: Payer: Self-pay

## 2019-03-20 ENCOUNTER — Ambulatory Visit: Payer: HMO | Admitting: Family Medicine

## 2019-03-20 ENCOUNTER — Encounter: Payer: Self-pay | Admitting: Family Medicine

## 2019-03-20 VITALS — BP 130/76 | HR 91 | Temp 98.3°F | Ht 66.0 in | Wt 178.8 lb

## 2019-03-20 DIAGNOSIS — M7661 Achilles tendinitis, right leg: Secondary | ICD-10-CM | POA: Diagnosis not present

## 2019-03-20 MED ORDER — NITROGLYCERIN 0.2 MG/HR TD PT24: MEDICATED_PATCH | TRANSDERMAL | 2 refills | Status: DC

## 2019-03-20 MED ORDER — PREDNISONE 20 MG PO TABS: ORAL_TABLET | ORAL | 0 refills | Status: DC

## 2019-03-20 NOTE — Progress Notes (Signed)
Jean Robinson T. Maydell [name], MD Primary Care and [place] at [facility] [place], [address] Phone: [phone]  FAX: [phone]  Jean Robinson - 64 y.o. female  MRN [id number]  Date of Birth: October 24, 1955  Visit Date: 03/20/2019  PCP: Jinny Sanders, MD  Referred by: Jinny Sanders, MD  Chief Complaint  Patient presents with  . Foot Pain    Right    This visit occurred during the SARS-CoV-2 public health emergency.  Safety protocols were in place, including screening questions prior to the visit, additional usage of staff PPE, and extensive cleaning of exam room while observing appropriate contact time as indicated for disinfecting solutions.   Subjective:   Pleasant patient who presents with a 3-4 history of posterior heel pain.  No occult, abrupt onset. Has been more insidious in character. There is a dull ache present and worse with activity:  Referral courtesy of Dr. Diona Browner for evaluation of r posterior heel pain:  R achilles tendinopathy.  Sometimes much more tender.  Worse around Christmas.  Late last fall and started to fall in the late fall.  Not really a pull but later on, gound to have   Prior home rehab: Minimal Prior meds: Over-the-counter NSAIDs only Orthosis / Braces: none   Review of Systems is noted in the HPI, as appropriate  Objective:   Blood pressure 130/76, pulse 91, temperature 98.3 F (36.8 C), temperature source Temporal, height 5\' 6"  (1.676 m), weight 178 lb 12 oz (81.1 kg), last menstrual period 02/02/2006, SpO2 99 %.  GEN: No acute distress; alert,appropriate. PULM: Breathing comfortably in no respiratory distress PSYCH: Normally interactive.   Foot: R Echymosis: no Edema: no ROM: full LE B Gait: heel toe, non-antalgic MT pain: no Callus pattern: none Lateral Mall: NT Medial Mall: NT Talus: NT Navicular: NT Cuboid: NT Calcaneous: NT Metatarsals: NT 5th MT: NT Phalanges:  NT Achilles: PAINFUL TO PALPATE JUST PROXIMAL TO INSERTION ON right, SMALL NODULE Plantar Fascia: NT Fat Pad: NT Peroneals: NT Post Tib: NT Great Toe: Nml motion Ant Drawer: neg ATFL: NT CFL: NT Deltoid: NT Sensation: intact  Radiology: No results found.  Assessment and Plan:     ICD-10-CM   1. Achilles tendinitis of right lower extremity  M76.61     Level of Medical Decision-Making in this case is MODERATE.  Pathophysiology of achilles tendinopathy reviewed.  Additionally, I have given the patient the program emphasizing eccentric overloading detailed in the instructions based on Dr. Trudi Ida work and protocols.  Supportive footwear reviewed.   Given her upcoming trip to [facility], I think it reasonable to be about as aggressive as possible with this.  I am going to place her on some oral steroids as well as started on nitroglycerin and rehab protocol.  I appreciate the opportunity to evaluate this very friendly patient. If you have any question regarding her care or prognosis, do not hesitate to ask.  Patient Instructions  Rubberized heel cups in shoes a back - TULI's heel cups (amazon)  Achilles Rehab  Calf raises on a step - start with 2 feet First lower and then raise on 1 foot If this is painful lower on 1 foot but do the heel raise on both feet  If you cannot do while standing, then do them sitting down  Begin with 3 sets of 10 repetitions  Increase by 5 repetitions every 3 days  Goal is 3 sets of 30  repetitions  Do with both knee straight and knee at 20 degrees of flexion  If pain persists at 3 sets of 30 - add backpack with 5 lbs Increase by 5 lbs per week to max of 30 lbs   Nitroglycerin Protocol   Apply 1/4 nitroglycerin patch to affected area daily.  Change position of patch within the affected area every 24 hours.  You may experience a headache during the first 1-2 weeks of using the patch, these should subside.  If you experience  headaches after beginning nitroglycerin patch treatment, you may take your preferred over the counter pain reliever.  Another side effect of the nitroglycerin patch is skin irritation or rash related to patch adhesive.  Please notify our office if you develop more severe headaches or rash, and stop the patch.  Tendon healing with nitroglycerin patch may require 12 to 24 weeks depending on the extent of injury.  Men should not use if taking Viagra, Cialis, or Levitra.   Do not use if you have migraines or rosacea.       Follow-up: She will follow-up with me after her return from [facility] ordered this encounter  Medications  . nitroGLYCERIN (NITRODUR - DOSED IN MG/24 HR) 0.2 mg/hr patch    Sig: Apply 1/4 patch to affected area as directed by MD and change every 24 hours.    Dispense:  30 patch    Refill:  2  . predniSONE (DELTASONE) 20 MG tablet    Sig: 2 tabs po daily for 5 days, then 1 tab po daily for 5 days    Dispense:  15 tablet    Refill:  0   There are no discontinued medications. No orders of the defined types were placed in this encounter.   Signed,  Maud Deed. Brilynn Biasi, MD   Outpatient Encounter Medications as of 03/20/2019  Medication Sig  . alendronate (FOSAMAX) 70 MG tablet Take 70 mg by mouth once a week.   Marland Kitchen aspirin 81 MG tablet Take 81 mg by mouth daily.    . cetirizine (ZYRTEC) 10 MG tablet Take 1 tablet (10 mg total) by mouth daily.  . fluticasone (FLONASE) 50 MCG/ACT nasal spray Place 2 sprays into both nostrils daily.  Marland Kitchen glimepiride (AMARYL) 2 MG tablet Take 2 mg by mouth daily with breakfast.  . Insulin Glargine (LANTUS SOLOSTAR) 100 UNIT/ML Solostar Pen INJECT 45 UNITS IN THE SKIN DAILY AT 10PM. IF FASTING BLOOD SUGAR >120 X2 DAYS, INCREASE BY 2 UNITS  . Insulin Pen Needle (B-D ULTRAFINE III SHORT PEN) 31G X 8 MM MISC USE TO INJECT LEVEMIR DAILY  . Lancet Devices (ONE TOUCH DELICA LANCING DEV) MISC Check blood sugar twice a day and as  directed. Dx E11.65  . latanoprost (XALATAN) 0.005 % ophthalmic solution Place 1 drop into both eyes at bedtime.   Marland Kitchen lisinopril (ZESTRIL) 40 MG tablet Take 1 tablet (40 mg total) by mouth daily.  . metFORMIN (GLUCOPHAGE-XR) 500 MG 24 hr tablet TAKE TWO (2) TABLETS BY MOUTH 2 TIMES DAILY  . ONETOUCH VERIO test strip USE TO CHECK BLOOD SUGAR THREE TIMES DAILY. DX: E11.65  . pravastatin (PRAVACHOL) 40 MG tablet TAKE 1 TABLET BY MOUTH EVERY DAY  . RESTASIS 0.05 % ophthalmic emulsion INSTILL 1 DROP INTO BOTH EYES TWICE A DAY INSTILL 1 DROP INTO BOTH EYES TWICE DAILY  . scopolamine (TRANSDERM-SCOP, 1.5 MG,) 1 MG/3DAYS Place 1 patch (1.5 mg total) onto the skin every 3 (three) days.  Marland Kitchen  sertraline (ZOLOFT) 25 MG tablet TAKE 1 TABLET BY MOUTH EVERY DAY  . SYSTANE ULTRA 0.4-0.3 % SOLN SMARTSIG:1 Drop(s) In Eye(s) As Needed  . valACYclovir (VALTREX) 500 MG tablet TAKE 1 TABLET BY MOUTH 2 TIMES DAILY, TAKE FOR 3 DAYS FOR ACUTE FLARE  . nitroGLYCERIN (NITRODUR - DOSED IN MG/24 HR) 0.2 mg/hr patch Apply 1/4 patch to affected area as directed by MD and change every 24 hours.  . predniSONE (DELTASONE) 20 MG tablet 2 tabs po daily for 5 days, then 1 tab po daily for 5 days   No facility-administered encounter medications on file as of 03/20/2019.

## 2019-03-20 NOTE — Patient Instructions (Signed)
Rubberized heel cups in shoes a back - TULI's heel cups (amazon)  Achilles Rehab  Calf raises on a step - start with 2 feet First lower and then raise on 1 foot If this is painful lower on 1 foot but do the heel raise on both feet  If you cannot do while standing, then do them sitting down  Begin with 3 sets of 10 repetitions  Increase by 5 repetitions every 3 days  Goal is 3 sets of 30 repetitions  Do with both knee straight and knee at 20 degrees of flexion  If pain persists at 3 sets of 30 - add backpack with 5 lbs Increase by 5 lbs per week to max of 30 lbs   Nitroglycerin Protocol   Apply 1/4 nitroglycerin patch to affected area daily.  Change position of patch within the affected area every 24 hours.  You may experience a headache during the first 1-2 weeks of using the patch, these should subside.  If you experience headaches after beginning nitroglycerin patch treatment, you may take your preferred over the counter pain reliever.  Another side effect of the nitroglycerin patch is skin irritation or rash related to patch adhesive.  Please notify our office if you develop more severe headaches or rash, and stop the patch.  Tendon healing with nitroglycerin patch may require 12 to 24 weeks depending on the extent of injury.  Men should not use if taking Viagra, Cialis, or Levitra.   Do not use if you have migraines or rosacea.

## 2019-03-21 ENCOUNTER — Encounter: Payer: Self-pay | Admitting: Family Medicine

## 2019-03-23 ENCOUNTER — Other Ambulatory Visit: Payer: HMO

## 2019-03-23 ENCOUNTER — Other Ambulatory Visit: Payer: HMO | Admitting: Obstetrics and Gynecology

## 2019-03-29 ENCOUNTER — Other Ambulatory Visit: Payer: Self-pay | Admitting: Family Medicine

## 2019-03-29 ENCOUNTER — Other Ambulatory Visit: Payer: Self-pay

## 2019-03-30 ENCOUNTER — Encounter: Payer: Self-pay | Admitting: Obstetrics and Gynecology

## 2019-03-30 ENCOUNTER — Ambulatory Visit (INDEPENDENT_AMBULATORY_CARE_PROVIDER_SITE_OTHER): Payer: HMO

## 2019-03-30 ENCOUNTER — Ambulatory Visit: Payer: HMO | Admitting: Obstetrics and Gynecology

## 2019-03-30 VITALS — BP 130/74 | HR 84 | Temp 97.1°F | Ht 66.0 in | Wt 181.6 lb

## 2019-03-30 DIAGNOSIS — R1032 Left lower quadrant pain: Secondary | ICD-10-CM | POA: Diagnosis not present

## 2019-03-30 NOTE — Patient Instructions (Signed)

## 2019-03-30 NOTE — Progress Notes (Signed)
GYNECOLOGY  VISIT   HPI: 64 y.o.   Married  Caucasian  female   [phone] with Patient's last menstrual period was 02/02/2006.   here for   LLQ pain of 3 - 4 months duration.  States that this is a fleeting pain and that she does have this pain today.  The pain is a pinch.  Does not need pain medication.   She has incomplete uterovaginal prolapse.  Third degree cystocele, first degree uterine prolapse, first degree rectocele. Hx renal calculi.  She denies hematuria, urgency, dysuria, flank pain.   Had done her Covid vaccines.  She will go to [place] next month.  GYNECOLOGIC HISTORY: Patient's last menstrual period was 02/02/2006. Contraception:  Postmenopausal Menopausal hormone therapy:  None Last mammogram:  10-06-17 3D/Neg/density A/BiRads1 Last pap smear: 09/24/2015 Neg:Neg HR HPV, 02-09-12 Neg:Neg HR HPV        OB History    Gravida  4   Para  4   Term  4   Preterm  0   AB  0   Living  4     SAB  0   TAB  0   Ectopic  0   Multiple  0   Live Births  4              Patient Active Problem List   Diagnosis Date Noted  . Hypertension associated with diabetes ([place]) 03/17/2019  . Motion sickness 03/17/2019  . Disorder of right Achilles tendon 03/17/2019  . Advice given about COVID-19 virus infection 07/21/2018  . Pyelonephritis 12/28/2017  . High serum calcium 10/01/2017  . Closed fracture of lateral portion of right tibial plateau 07/30/2017  . OA (osteoarthritis) of finger 03/05/2017  . Genital herpes 10/10/2015  . Uncontrolled type 2 diabetes mellitus with hyperglycemia ([place])   . DEPRESSION, MILD 02/27/2008  . Hyperlipidemia LDL goal <100 11/18/2006  . Essential hypertension, benign 11/18/2006  . ALLERGIC RHINITIS 11/18/2006    Past Medical History:  Diagnosis Date  . Allergy   . Anxiety   . Closed fracture of lateral portion of right tibial plateau 07/30/2017  . Depression   . Diabetes mellitus   . H/O renal calculi 10/08/09  . Hyperlipidemia     . Hypertension   . Serum calcium elevated   . STD (sexually transmitted disease)    HSV I & II, IgG reflex positive  . Tibial plateau fracture, right     Past Surgical History:  Procedure Laterality Date  . APPENDECTOMY    . CHOLECYSTECTOMY    . ORIF TIBIA PLATEAU Right 07/30/2017   Procedure: OPEN REDUCTION INTERNAL FIXATION (ORIF) TIBIAL PLATEAU;  Surgeon: Marchia Bond, MD;  Location: [facility];  Service: Orthopedics;  Laterality: Right;  . TONSILLECTOMY      Current Outpatient Medications  Medication Sig Dispense Refill  . alendronate (FOSAMAX) 70 MG tablet Take 70 mg by mouth once a week.     Marland Kitchen aspirin 81 MG tablet Take 81 mg by mouth daily.      . Blood Glucose Monitoring Suppl (ONETOUCH VERIO REFLECT) w/Device KIT See admin instructions.    . cetirizine (ZYRTEC) 10 MG tablet Take 1 tablet (10 mg total) by mouth daily. 30 tablet 11  . fluticasone (FLONASE) 50 MCG/ACT nasal spray Place 2 sprays into both nostrils daily. 16 g 6  . glimepiride (AMARYL) 2 MG tablet Take 2 mg by mouth daily with breakfast.    . Insulin Glargine (LANTUS SOLOSTAR) 100 UNIT/ML Solostar Pen  INJECT 45 UNITS IN THE SKIN DAILY AT 10PM. IF FASTING BLOOD SUGAR >120 X2 DAYS, INCREASE BY 2 UNITS 45 mL 3  . Insulin Pen Needle (B-D ULTRAFINE III SHORT PEN) 31G X 8 MM MISC USE TO INJECT LEVEMIR DAILY 100 each 3  . Lancet Devices (ONE TOUCH DELICA LANCING DEV) MISC Check blood sugar twice a day and as directed. Dx E11.65 1 each 0  . latanoprost (XALATAN) 0.005 % ophthalmic solution Place 1 drop into both eyes at bedtime.     Marland Kitchen lisinopril (ZESTRIL) 40 MG tablet Take 1 tablet (40 mg total) by mouth daily. 90 tablet 1  . metFORMIN (GLUCOPHAGE-XR) 500 MG 24 hr tablet TAKE TWO (2) TABLETS BY MOUTH 2 TIMES DAILY 360 tablet 3  . nitroGLYCERIN (NITRODUR - DOSED IN MG/24 HR) 0.2 mg/hr patch Apply 1/4 patch to affected area as directed by MD and change every 24 hours. 30 patch 2  . ONETOUCH VERIO test strip  USE TO CHECK BLOOD SUGAR THREE TIMES DAILY. DX: E11.65 100 strip 11  . pravastatin (PRAVACHOL) 40 MG tablet TAKE 1 TABLET BY MOUTH EVERY DAY 90 tablet 2  . RESTASIS 0.05 % ophthalmic emulsion INSTILL 1 DROP INTO BOTH EYES TWICE A DAY INSTILL 1 DROP INTO BOTH EYES TWICE DAILY    . scopolamine (TRANSDERM-SCOP, 1.5 MG,) 1 MG/3DAYS Place 1 patch (1.5 mg total) onto the skin every 3 (three) days. 2 patch 0  . sertraline (ZOLOFT) 25 MG tablet TAKE 1 TABLET BY MOUTH EVERY DAY 90 tablet 1  . SYSTANE ULTRA 0.4-0.3 % SOLN SMARTSIG:1 Drop(s) In Eye(s) As Needed    . valACYclovir (VALTREX) 500 MG tablet TAKE 1 TABLET BY MOUTH 2 TIMES DAILY, TAKE FOR 3 DAYS FOR ACUTE FLARE 60 tablet 6   No current facility-administered medications for this visit.     ALLERGIES: Patient has no known allergies.  Family History  Problem Relation Age of Onset  . Depression Father   . Diabetes Father   . COPD Mother   . Diabetes Mother   . Macular degeneration Mother   . Diabetes Brother   . Cancer Other        liver  . Diabetes Other     Social History   Socioeconomic History  . Marital status: Married    Spouse name: Not on file  . Number of children: 4  . Years of education: Not on file  . Highest education level: Not on file  Occupational History  . Occupation: [employer]    Employer: [employer]  Tobacco Use  . Smoking status: Never Smoker  . Smokeless tobacco: Never Used  Substance and Sexual Activity  . Alcohol use: Yes    Alcohol/week: 0.0 - 2.0 standard drinks    Comment: social  . Drug use: No  . Sexual activity: Yes    Partners: Male    Birth control/protection: Post-menopausal  Other Topics Concern  . Not on file  Social History Narrative   Regular exercise --yes, treadmill 15 min 3 days per week    Originally from [place] and moved here 30 years ago and went to college at [employer] with a degree in the child education   Occasional drinker and nonsmoker   Has 4  children   Lives with her second husband   Social Determinants of Radio broadcast assistant Strain:   . Difficulty of Paying Living Expenses: Not on file  Food Insecurity: No Food Insecurity  . Worried  About Running Out of Food in the Last Year: Never true  . Ran Out of Food in the Last Year: Never true  Transportation Needs: No Transportation Needs  . Lack of Transportation (Medical): No  . Lack of Transportation (Non-Medical): No  Physical Activity:   . Days of Exercise per Week: Not on file  . Minutes of Exercise per Session: Not on file  Stress:   . Feeling of Stress : Not on file  Social Connections:   . Frequency of Communication with Friends and Family: Not on file  . Frequency of Social Gatherings with Friends and Family: Not on file  . Attends Religious Services: Not on file  . Active Member of Clubs or Organizations: Not on file  . Attends Archivist Meetings: Not on file  . Marital Status: Not on file  Intimate Partner Violence:   . Fear of Current or Ex-Partner: Not on file  . Emotionally Abused: Not on file  . Physically Abused: Not on file  . Sexually Abused: Not on file    Review of Systems  All other systems reviewed and are negative.   PHYSICAL EXAMINATION:    BP 130/74   Pulse 84   Temp (!) 97.1 F (36.2 C) (Temporal)   Ht '5\' 6"'  (1.676 m)   Wt 181 lb 9.6 oz (82.4 kg)   LMP 02/02/2006   BMI 29.31 kg/m     General appearance: alert, cooperative and appears stated age  Pelvic US images and report reviewed with patient.  Uterus normal with no masses.  Some calcifications.  EMS 3.01.  Normal ovaries.  No free fluid.   ASSESSMENT  LLQ pain.  Undetermined etiology.  Hx renal stones.  Pelvic organ prolapse.   PLAN  Reassurance regarding US findings today.  I do not think the pain is gynecologic in origin.  I offered to check a urine dip today to rule out hematuria.   She is unable to void. She will follow up with her PCP if pain  persists.  Return for annual exam and prn

## 2019-04-25 ENCOUNTER — Encounter: Payer: Self-pay | Admitting: Certified Nurse Midwife

## 2019-05-09 MED ORDER — ALENDRONATE SODIUM 70 MG PO TABS: 70.0000 mg | ORAL_TABLET | ORAL | 3 refills | Status: DC

## 2019-05-09 NOTE — Progress Notes (Signed)
64 y.o. G45P4004 Married Caucasian female here for annual exam.    No problem with bladder or bowel function.   Her A1C 7.3.  Received her Covid vaccines.   Back from [place].   PCP:  Algie Coffer, MD   Patient's last menstrual period was 02/02/2006.           Sexually active: Yes.      The current method of family planning is post menopausal status.    Exercising: Yes.    walking and keeps grandchildren Smoker:  no  Health Maintenance: Pap: 09-24-15 Neg:Neg HR HPV,02-09-12 Neg:Neg HR HPV History of abnormal Pap:  no MMG: 10-06-17 3D/Neg/density A/Birads1 Colonoscopy:  04/20/2007 - normal;next 04/2017--needs referral BMD: 2019 Result :?Osteopenia/Osteoporosis--Takes Fosamax TDaP: 09-13-18 Td Gardasil:   no HIV: 08-09-15 NR Hep C: 08-09-15 Neg Screening Labs:  PCP.    reports that she has never smoked. She has never used smokeless tobacco. She reports current alcohol use. She reports that she does not use drugs.  Past Medical History:  Diagnosis Date  . Allergy   . Anxiety   . Closed fracture of lateral portion of right tibial plateau 07/30/2017  . Depression   . Diabetes mellitus   . H/O renal calculi 10/08/09  . Hyperlipidemia   . Hypertension   . Serum calcium elevated   . STD (sexually transmitted disease)    HSV I & II, IgG reflex positive  . Tibial plateau fracture, right     Past Surgical History:  Procedure Laterality Date  . APPENDECTOMY    . CHOLECYSTECTOMY    . ORIF TIBIA PLATEAU Right 07/30/2017   Procedure: OPEN REDUCTION INTERNAL FIXATION (ORIF) TIBIAL PLATEAU;  Surgeon: Marchia Bond, MD;  Location: [facility];  Service: Orthopedics;  Laterality: Right;  . TONSILLECTOMY      Current Outpatient Medications  Medication Sig Dispense Refill  . alendronate (FOSAMAX) 70 MG tablet Take 1 tablet (70 mg total) by mouth once a week. 12 tablet 3  . aspirin 81 MG tablet Take 81 mg by mouth daily.      . Blood Glucose Monitoring Suppl (ONETOUCH VERIO  REFLECT) w/Device KIT See admin instructions.    . cetirizine (ZYRTEC) 10 MG tablet Take 1 tablet (10 mg total) by mouth daily. 30 tablet 11  . fluticasone (FLONASE) 50 MCG/ACT nasal spray Place 2 sprays into both nostrils daily. 16 g 6  . Insulin Glargine (LANTUS SOLOSTAR) 100 UNIT/ML Solostar Pen INJECT 45 UNITS IN THE SKIN DAILY AT 10PM. IF FASTING BLOOD SUGAR >120 X2 DAYS, INCREASE BY 2 UNITS 45 mL 3  . Insulin Pen Needle (B-D ULTRAFINE III SHORT PEN) 31G X 8 MM MISC USE TO INJECT LEVEMIR DAILY 100 each 3  . Lancet Devices (ONE TOUCH DELICA LANCING DEV) MISC Check blood sugar twice a day and as directed. Dx E11.65 1 each 0  . latanoprost (XALATAN) 0.005 % ophthalmic solution Place 1 drop into both eyes at bedtime.     Marland Kitchen lisinopril (ZESTRIL) 40 MG tablet Take 1 tablet (40 mg total) by mouth daily. 90 tablet 1  . metFORMIN (GLUCOPHAGE-XR) 500 MG 24 hr tablet TAKE TWO (2) TABLETS BY MOUTH 2 TIMES DAILY 360 tablet 3  . nitroGLYCERIN (NITRODUR - DOSED IN MG/24 HR) 0.2 mg/hr patch Apply 1/4 patch to affected area as directed by MD and change every 24 hours. 30 patch 2  . ONETOUCH VERIO test strip USE TO CHECK BLOOD SUGAR THREE TIMES DAILY. DX: E11.65 100 strip 11  .  pravastatin (PRAVACHOL) 40 MG tablet TAKE 1 TABLET BY MOUTH EVERY DAY 90 tablet 2  . RESTASIS 0.05 % ophthalmic emulsion INSTILL 1 DROP INTO BOTH EYES TWICE A DAY INSTILL 1 DROP INTO BOTH EYES TWICE DAILY    . sertraline (ZOLOFT) 25 MG tablet TAKE 1 TABLET BY MOUTH EVERY DAY 90 tablet 1  . SYSTANE ULTRA 0.4-0.3 % SOLN SMARTSIG:1 Drop(s) In Eye(s) As Needed    . valACYclovir (VALTREX) 500 MG tablet TAKE 1 TABLET BY MOUTH 2 TIMES DAILY, TAKE FOR 3 DAYS FOR ACUTE FLARE 60 tablet 6   No current facility-administered medications for this visit.    Family History  Problem Relation Age of Onset  . Depression Father   . Diabetes Father   . COPD Mother   . Diabetes Mother   . Macular degeneration Mother   . Diabetes Brother   . Cancer  Other        liver  . Diabetes Other     Review of Systems  Hematological: Does not bruise/bleed easily.  All other systems reviewed and are negative.   Exam:   BP 138/74 (Cuff Size: Large)   Pulse 70   Temp 98.1 F (36.7 C) (Temporal)   Resp 14   Ht 5' 7.5" (1.715 m)   Wt 182 lb 3.2 oz (82.6 kg)   LMP 02/02/2006   BMI 28.12 kg/m     General appearance: alert, cooperative and appears stated age Head: normocephalic, without obvious abnormality, atraumatic Neck: no adenopathy, supple, symmetrical, trachea midline and thyroid normal to inspection and palpation Lungs: clear to auscultation bilaterally Breasts: normal appearance, no masses or tenderness, No nipple retraction or dimpling, No nipple discharge or bleeding, No axillary adenopathy Heart: regular rate and rhythm Abdomen: soft, non-tender; no masses, no organomegaly Extremities: extremities normal, atraumatic, no cyanosis or edema Skin: skin color, texture, turgor normal. No rashes or lesions Lymph nodes: cervical, supraclavicular, and axillary nodes normal. Neurologic: grossly normal  Pelvic: External genitalia:  no lesions              No abnormal inguinal nodes palpated.              Urethra:  normal appearing urethra with no masses, tenderness or lesions              Bartholins and Skenes: normal                 Vagina: normal appearing vagina with normal color and discharge, no lesions Third degree cystocele, first degree uterine prolapse, first degree rectocele.              Cervix: no lesions              Pap taken: Yes.   Bimanual Exam:  Uterus:  normal size, contour, position, consistency, mobility, non-tender              Adnexa: no mass, fullness, tenderness              Rectal exam: Yes.  .  Confirms.              Anus:  normal sphincter tone, no lesions  Chaperone was present for exam.  Assessment:   Well woman visit with normal exam. Hx HSV I and II.  DM.  Third degree cystocele, first degree  uterine prolapse, first degree rectocele.  Stable.  Hx elevated calcium.  Plan: Mammogram screening discussed. Self breast awareness reviewed. Pap and HR HPV as above. Guidelines  for Calcium, Vitamin D, regular exercise program including cardiovascular and weight bearing exercise. Observational management of pelvic organ prolapse. Referral for colonoscopy.  Follow up annually and prn.

## 2019-05-10 ENCOUNTER — Other Ambulatory Visit (HOSPITAL_COMMUNITY)
Admission: RE | Admit: 2019-05-10 | Discharge: 2019-05-10 | Disposition: A | Discharge status: Home / Self Care | Payer: HMO | Source: Ambulatory Visit | Attending: Obstetrics and Gynecology | Admitting: Obstetrics and Gynecology

## 2019-05-10 ENCOUNTER — Encounter: Payer: Self-pay | Admitting: Obstetrics and Gynecology

## 2019-05-10 ENCOUNTER — Other Ambulatory Visit: Payer: Self-pay

## 2019-05-10 ENCOUNTER — Ambulatory Visit (INDEPENDENT_AMBULATORY_CARE_PROVIDER_SITE_OTHER): Payer: HMO | Admitting: Obstetrics and Gynecology

## 2019-05-10 VITALS — BP 138/74 | HR 70 | Temp 98.1°F | Resp 14 | Ht 67.5 in | Wt 182.2 lb

## 2019-05-10 DIAGNOSIS — Z1151 Encounter for screening for human papillomavirus (HPV): Secondary | ICD-10-CM | POA: Diagnosis not present

## 2019-05-10 DIAGNOSIS — Z780 Asymptomatic menopausal state: Secondary | ICD-10-CM | POA: Diagnosis not present

## 2019-05-10 DIAGNOSIS — Z01419 Encounter for gynecological examination (general) (routine) without abnormal findings: Secondary | ICD-10-CM

## 2019-05-10 DIAGNOSIS — Z1211 Encounter for screening for malignant neoplasm of colon: Secondary | ICD-10-CM | POA: Diagnosis not present

## 2019-05-10 NOTE — Patient Instructions (Signed)

## 2019-05-12 LAB — CYTOLOGY - PAP
Comment: NEGATIVE
Diagnosis: NEGATIVE
High risk HPV: NEGATIVE

## 2019-05-13 ENCOUNTER — Other Ambulatory Visit: Payer: Self-pay | Admitting: Family Medicine

## 2019-06-01 ENCOUNTER — Encounter: Payer: Self-pay | Admitting: Obstetrics and Gynecology

## 2019-06-01 DIAGNOSIS — Z1231 Encounter for screening mammogram for malignant neoplasm of breast: Secondary | ICD-10-CM | POA: Diagnosis not present

## 2019-06-08 ENCOUNTER — Telehealth: Payer: Self-pay

## 2019-06-08 NOTE — Telephone Encounter (Signed)
Patient is calling in regards to mammogram results from date of (05/31/29). Patient states she has contacted [facility] and has not been able to reach anyone about her results. Patient is wondering if her results have been sent to our office and would like to discuss them.

## 2019-06-08 NOTE — Telephone Encounter (Signed)
Call to patient advised of MMG results as seen below. Advised patient she should receive copy of report in mail from [facility]. Questions answered. Patient verbalizes understanding and is agreeable.   [facility] Screening MMG on 06/01/19 Negative. Routine MMG in 1 yr.    Copy of report to Dr. Quincy Simmonds.   Routing to provider for final review. Patient is agreeable to disposition. Will close encounter.

## 2019-06-21 DIAGNOSIS — L814 Other melanin hyperpigmentation: Secondary | ICD-10-CM | POA: Diagnosis not present

## 2019-06-21 DIAGNOSIS — L570 Actinic keratosis: Secondary | ICD-10-CM | POA: Diagnosis not present

## 2019-06-21 DIAGNOSIS — L816 Other disorders of diminished melanin formation: Secondary | ICD-10-CM | POA: Diagnosis not present

## 2019-07-17 DIAGNOSIS — H401131 Primary open-angle glaucoma, bilateral, mild stage: Secondary | ICD-10-CM | POA: Diagnosis not present

## 2019-07-17 LAB — HM DIABETES EYE EXAM

## 2019-07-18 ENCOUNTER — Ambulatory Visit: Payer: Self-pay

## 2019-07-19 DIAGNOSIS — L821 Other seborrheic keratosis: Secondary | ICD-10-CM | POA: Diagnosis not present

## 2019-07-19 DIAGNOSIS — L249 Irritant contact dermatitis, unspecified cause: Secondary | ICD-10-CM | POA: Diagnosis not present

## 2019-07-21 ENCOUNTER — Other Ambulatory Visit: Payer: Self-pay

## 2019-07-21 NOTE — Patient Outreach (Signed)
  [facility]) Care Management Chronic Special Needs Program    07/21/2019  Name: Jean Robinson, DOB: [birth year]  MRN: [id number]   [name] is enrolled in a chronic special needs plan for Diabetes. Telephone call to client for CSNP assessment follow up. Unable to reach. HIPAA compliant voice message left with call back phone number.   PLAN; RNCM will attempt 2nd telephone call to client within 1 week.   Quinn Plowman RN,BSN,CCM [facility] Management [phone]

## 2019-07-24 ENCOUNTER — Other Ambulatory Visit: Payer: Self-pay

## 2019-07-24 NOTE — Patient Outreach (Signed)
  [facility]) Care Management Chronic Special Needs Program    07/24/2019  Name: Jean Robinson, DOB: Nov 29, 1955  MRN: [id number]   Jean Robinson is enrolled in a chronic special needs plan for Diabetes. Second telephone call to client for CSNP assessment follow up. Unable to reach. HIPAA compliant voice message left with call back phone number.  PLAN; RNCM will attempt 3rd telephone outreach call to client in 2 week.   Quinn Plowman RN,BSN,CCM [facility] [phone]

## 2019-07-25 ENCOUNTER — Other Ambulatory Visit: Payer: Self-pay

## 2019-07-25 ENCOUNTER — Ambulatory Visit: Payer: Self-pay

## 2019-07-25 NOTE — Patient Outreach (Signed)
[facility]) Care Management Chronic Special Needs Program  07/25/2019  Name: Jean Robinson DOB: [birth year]  MRN: [id number]  Jean Robinson is enrolled in a chronic special needs plan for Diabetes. Telephone call to client for CSNP assessment follow up. HIPAA verified. Client states she is doing well. She reports having a recent follow up with her doctor. Client reports her annual wellness visit is scheduled for 09/15/19.  Client states her achilles tendonitis seems to being doing better with recently prescribed nitroglycerin patches.  Client states she recently stopped using the patches because she wasn't sure how long she should use them.    Goals Addressed            This Visit's Progress   . COMPLETED:  Acknowledge receipt of Advanced Directive package       Client reports receiving Advance Directive packet.     . Client understands the importance of follow-up with providers by attending scheduled visits   On track    Primary care provider visit 03/17/19, 10/30/18, 09/13/18 Client reports her annual wellness visit with primary care provider is scheduled for 09/15/19. Continue to follow up with your doctor as advised.     . Client will verbalize knowledge of self management of Hypertension as evidences by BP reading of 140/90 or less; or as defined by provider   On track    Recent blood pressure reading 130/70 on 03/17/19 and 140/68 on 03/15/19.  Assessed Hypertension self management skills.  Client reports need for new blood pressure monitor: Contact your [employer] concierge and request the over the counter catalog to order.  Plan to check your blood pressure regularly and record readings.  Continue to take your medications as prescribed.      . Client will verbalize self management of hypoglycemia   On track    RN case manager discussed Rule of 15 with client:   STEP 1:  Take 15 grams of carbohydrates when your blood sugar is low which includes:  3-4 glucose  tablets or 3-4 oz of juice or regular soda or One tube of glucose gel STEP 2:  Recheck your blood sugar in 15 minutes STEP 3:  If your blood sugar is still low at the 15 minute recheck then, go back to STEP 1 and treat again with another 15 grams of carbohydrates Add a protein such as cheese and crackers or peanut butter crackers once blood sugar returns to normal range.   Notify your doctor of frequent low blood sugars.       . Client will verbalize understanding of how long to take prescribed nitroglycerin patch   On track    Client unsure how long to take nitroglycerin patch for her achilles tendonitis condition. RN case manager advised client to contact the prescribing doctor to determine how long she should use nitroglycerin patches.     Marland Kitchen HEMOGLOBIN A1C < 7       Discussed Hgb A1c target.  Client most recent A1c 7.3 down from previous A1c of 8.2. Discussed diabetes self management actions:  Glucose monitoring per provider recommendation  Check feet daily  Visit provider every 3-6 months as directed  Hbg A1C level every 3-6 months.  Eye Exam yearly  Carbohydrate controlled meal planning  Taking diabetes medication as prescribed by provider  Incorporate exercise into your routine at least 3 days per week or as advised by  Client reports she will start going to the gym again for exercise.      Marland Kitchen  Maintain timely refills of diabetic medication as prescribed within the year .   On track    Client reports obtaining her refills without difficulty.  Medication review completed with client.  Contact your RN case manager if you have questions about your medicines.       . Obtain annual  Lipid Profile, LDL-C   On track    Lipid profile completed on 09/09/18 The goal for LDL is less than 70 mg/ dl as you are at high risk for complications try to avoid saturated fats, trans-fats, and eat more fiber.     . COMPLETED: Obtain Annual Eye (retinal)  Exam        Client  states annual  eye exam was 03/20/19      . COMPLETED: Obtain Annual Foot Exam       Client states her provider checks her feet at each office visit. Client reports annual diabetic foot exam was 03/13/19    . Obtain annual screen for micro albuminuria (urine) , nephropathy (kidney problems)   On track    Micro albuminuria screen date not listed.  Diabetes can affect your kidneys.  Its is important for your doctor to check your urine at least once a year. These test show how your kidneys are working. Discuss this with your doctor at your next visit.     Illa Level Hemoglobin A1C at least 2 times per year   On track    Per medical record review Hgb A1c completed on 03/10/19 and 09/09/18  Continue to keep your follow up appointments with your provider and have lab work completed as recommended.      . Visit Primary Care Provider or Endocrinologist at least 2 times per year    On track    Client reports adherence to provider appointments.   Client saw her primary MD on 03/17/19 and 09/13/18 Client reports her annual wellness visit is scheduled for 09/15/19.  Continue to keep your follow up appointments with your providers.         Plan:  RN case manager will send updated care plan to client and primary care provider.   Chronic care management coordinator will outreach in:  6 Months  Quinn Plowman RN,BSN,CCM [place] [facility] [phone]    .

## 2019-08-18 ENCOUNTER — Other Ambulatory Visit: Payer: Self-pay | Admitting: Family Medicine

## 2019-08-18 DIAGNOSIS — I10 Essential (primary) hypertension: Secondary | ICD-10-CM

## 2019-09-06 ENCOUNTER — Telehealth: Payer: Self-pay | Admitting: Family Medicine

## 2019-09-06 NOTE — Telephone Encounter (Signed)
Please schedule MWV with nurse and CPE with Dr. Bedsole. 

## 2019-09-07 ENCOUNTER — Telehealth: Payer: Self-pay | Admitting: Family Medicine

## 2019-09-07 DIAGNOSIS — E1165 Type 2 diabetes mellitus with hyperglycemia: Secondary | ICD-10-CM

## 2019-09-07 NOTE — Telephone Encounter (Signed)
-----   Message from Ellamae Sia sent at 08/23/2019  9:44 AM EDT ----- Regarding: Lab orders for Friday, 8.6.21 Patient is scheduled for CPX labs, please order future labs, Thanks , Karna Christmas

## 2019-09-08 ENCOUNTER — Other Ambulatory Visit: Payer: 59

## 2019-09-08 NOTE — Telephone Encounter (Signed)
Tried call pt no answer

## 2019-09-15 ENCOUNTER — Encounter: Payer: 59 | Admitting: Family Medicine

## 2019-09-21 NOTE — Telephone Encounter (Signed)
9/28 labs 10/1 cpx

## 2019-10-02 ENCOUNTER — Telehealth: Payer: Self-pay | Admitting: *Deleted

## 2019-10-02 NOTE — Telephone Encounter (Signed)
Jean Robinson notified as instructed by telephone.  She will get her test done tomorrow at [facility] and she will let us know her results once they are back.  She thinks her husband started with symptoms last Thursday.

## 2019-10-02 NOTE — Telephone Encounter (Signed)
Sounds like she has the right idea... test, infusion of MAB given she is higher risk, then isolation and symptomatic care. She can virtual to discuss symptoms more if she likes.  I can also sign her up for [place] monitoring.Marland Kitchen COVID companion if she would like.

## 2019-10-02 NOTE — Telephone Encounter (Signed)
Patient left a voicemail stating that her husband went to the hospital last night and has covid. Jean Robinson stated that her husband is coming home today and going back tomorrow for the infusion. Patient stated that she is at high risk with diabetes. Patient stated that she is scheduled for a PCR test tomorrow at [facility]. Patient wants to know what Dr. Diona Browner feels that her next step should be?

## 2019-10-07 ENCOUNTER — Other Ambulatory Visit: Payer: Self-pay

## 2019-10-07 ENCOUNTER — Ambulatory Visit
Admission: EM | Admit: 2019-10-07 | Discharge: 2019-10-07 | Disposition: A | Discharge status: Home / Self Care | Payer: HMO | Attending: Physician Assistant | Admitting: Physician Assistant

## 2019-10-07 DIAGNOSIS — R109 Unspecified abdominal pain: Secondary | ICD-10-CM | POA: Diagnosis not present

## 2019-10-07 LAB — POCT URINALYSIS DIP (MANUAL ENTRY)
Bilirubin, UA: NEGATIVE
Glucose, UA: NEGATIVE mg/dL
Ketones, POC UA: NEGATIVE mg/dL
Nitrite, UA: NEGATIVE
Protein Ur, POC: NEGATIVE mg/dL
Spec Grav, UA: >=1.03 — AB (ref 1.010–1.025)
Urobilinogen, UA: 0.2 E.U./dL
pH, UA: 5 (ref 5.0–8.0)

## 2019-10-07 MED ORDER — CEPHALEXIN 500 MG PO CAPS
500.0000 mg | ORAL_CAPSULE | Freq: Three times a day (TID) | ORAL | 0 refills | Status: DC
Start: 2019-10-07 — End: 2019-11-03

## 2019-10-07 MED ORDER — HYDROCODONE-ACETAMINOPHEN 5-325 MG PO TABS: 1.0000 | ORAL_TABLET | Freq: Three times a day (TID) | ORAL | 0 refills | Status: DC | PRN

## 2019-10-07 MED ORDER — KETOROLAC TROMETHAMINE 10 MG PO TABS: 10.0000 mg | ORAL_TABLET | Freq: Four times a day (QID) | ORAL | 0 refills | Status: DC | PRN

## 2019-10-07 MED ORDER — KETOROLAC TROMETHAMINE 15 MG/ML IJ SOLN
15.0000 mg | Freq: Once | INTRAMUSCULAR | Status: AC
  Administered 2019-10-07: 15 mg via INTRAMUSCULAR

## 2019-10-07 NOTE — ED Triage Notes (Signed)
Pt c/o right flank pain for approx 3-4 days. Pt states prior to flank pain beginning, she had "rust-colored" urine on one occasion. Denies fever, chills, n/v/d. Also states that her morning glucose level was elevated at 145. Pt states h/o renal stones.

## 2019-10-07 NOTE — ED Provider Notes (Signed)
[name]    CSN: [id number] Arrival date & time: 10/07/19  0805      History   Chief Complaint Chief Complaint  Patient presents with  . Flank Pain    HPI Jean Robinson is a 64 y.o. female.   64 year old female with history of DM, HLD, HTN comes in for 3-4 day history of right flank pain. Had "rust colored" urine once prior to current symptom onset. Flank pain is intermittent, no radiation of pain. Denies fever, chills. Denies nausea, vomiting, diarrhea. Had an episode of abdominal pain last night that has since resolved, unsure location. Denies urinary frequency, dysuria.      Past Medical History:  Diagnosis Date  . Achilles tendonitis   . Allergy   . Anxiety   . Closed fracture of lateral portion of right tibial plateau 07/30/2017  . Depression   . Diabetes mellitus   . H/O renal calculi 10/08/09  . Hyperlipidemia   . Hypertension   . Serum calcium elevated   . STD (sexually transmitted disease)    HSV I & II, IgG reflex positive  . Tibial plateau fracture, right     Patient Active Problem List   Diagnosis Date Noted  . Hypertension associated with diabetes ([place]) 03/17/2019  . Motion sickness 03/17/2019  . Disorder of right Achilles tendon 03/17/2019  . Advice given about COVID-19 virus infection 07/21/2018  . Pyelonephritis 12/28/2017  . High serum calcium 10/01/2017  . Closed fracture of lateral portion of right tibial plateau 07/30/2017  . OA (osteoarthritis) of finger 03/05/2017  . Genital herpes 10/10/2015  . Uncontrolled type 2 diabetes mellitus with hyperglycemia ([name])   . DEPRESSION, MILD 02/27/2008  . Hyperlipidemia LDL goal <100 11/18/2006  . Essential hypertension, benign 11/18/2006  . ALLERGIC RHINITIS 11/18/2006    Past Surgical History:  Procedure Laterality Date  . APPENDECTOMY    . CHOLECYSTECTOMY    . ORIF TIBIA PLATEAU Right 07/30/2017   Procedure: OPEN REDUCTION INTERNAL FIXATION (ORIF) TIBIAL PLATEAU;  Surgeon: Marchia Bond, MD;  Location: [place];  Service: Orthopedics;  Laterality: Right;  . TONSILLECTOMY      OB History    Gravida  4   Para  4   Term  4   Preterm  0   AB  0   Living  4     SAB  0   TAB  0   Ectopic  0   Multiple  0   Live Births  4            Home Medications    Prior to Admission medications   Medication Sig Start Date End Date Taking? Authorizing Provider  alendronate (FOSAMAX) 70 MG tablet Take 1 tablet (70 mg total) by mouth once a week. 05/09/19  Yes Bedsole, Gennell How E, MD  aspirin 81 MG tablet Take 81 mg by mouth daily.     Yes [provider]  fluticasone (FLONASE) 50 MCG/ACT nasal spray Place 2 sprays into both nostrils daily. 10/30/18  Yes Hawks, Christy A, FNP  Insulin Glargine (LANTUS SOLOSTAR) 100 UNIT/ML Solostar Pen INJECT 45 UNITS IN THE SKIN DAILY AT 10PM. IF FASTING BLOOD SUGAR >120 X2 DAYS, INCREASE BY 2 UNITS 10/20/18  Yes Bedsole, Eryanna Regal E, MD  lisinopril (ZESTRIL) 40 MG tablet TAKE 1 TABLET BY MOUTH EVERY DAY 08/18/19  Yes Bedsole, Maicee Ullman E, MD  metFORMIN (GLUCOPHAGE-XR) 500 MG 24 hr tablet TAKE TWO (2) TABLETS BY MOUTH 2  TIMES DAILY 03/29/19  Yes Bedsole, Pankaj Haack E, MD  pravastatin (PRAVACHOL) 40 MG tablet TAKE 1 TABLET BY MOUTH EVERY DAY 09/06/19  Yes Bedsole, Jaaziel Peatross E, MD  sertraline (ZOLOFT) 25 MG tablet TAKE 1 TABLET BY MOUTH EVERY DAY 09/06/19  Yes Bedsole, Lateia Fraser E, MD  Blood Glucose Monitoring Suppl (ONETOUCH VERIO REFLECT) w/Device KIT See admin instructions. 03/21/19   [provider]  cephALEXin (KEFLEX) 500 MG capsule Take 1 capsule (500 mg total) by mouth 3 (three) times daily. 10/07/19   Tasia Catchings, Melissa Tomaselli V, PA-C  cetirizine (ZYRTEC) 10 MG tablet Take 1 tablet (10 mg total) by mouth daily. 10/30/18   Sharion Balloon, FNP  HYDROcodone-acetaminophen (NORCO/VICODIN) 5-325 MG tablet Take 1 tablet by mouth every 8 (eight) hours as needed for severe pain. 10/07/19   Tasia Catchings, Nox Talent V, PA-C  Insulin Pen Needle (B-D ULTRAFINE III SHORT PEN) 31G X 8  MM MISC USE TO INJECT LEVEMIR DAILY 01/24/19   Bedsole, Daking Westervelt E, MD  ketorolac (TORADOL) 10 MG tablet Take 1 tablet (10 mg total) by mouth every 6 (six) hours as needed. 10/07/19   Ok Edwards, PA-C  Lancet Devices (ONE TOUCH DELICA LANCING DEV) MISC Check blood sugar twice a day and as directed. Dx E11.65 05/13/16   Jinny Sanders, MD  latanoprost (XALATAN) 0.005 % ophthalmic solution Place 1 drop into both eyes at bedtime.  07/26/15   [provider]  nitroGLYCERIN (NITRODUR - DOSED IN MG/24 HR) 0.2 mg/hr patch Apply 1/4 patch to affected area as directed by MD and change every 24 hours. 03/20/19   Copland, Frederico Hamman, MD  ONETOUCH VERIO test strip USE TO CHECK BLOOD SUGAR THREE TIMES DAILY. DX: E11.65 01/17/19   Bedsole, Hakeen Shipes E, MD  RESTASIS 0.05 % ophthalmic emulsion INSTILL 1 DROP INTO BOTH EYES TWICE A DAY INSTILL 1 DROP INTO BOTH EYES TWICE DAILY 09/13/18   [provider]  SYSTANE ULTRA 0.4-0.3 % SOLN SMARTSIG:1 Drop(s) In Eye(s) As Needed 10/26/18   [provider]  valACYclovir (VALTREX) 500 MG tablet TAKE 1 TABLET BY MOUTH 2 TIMES DAILY, TAKE FOR 3 DAYS FOR ACUTE FLARE 10/21/18   Jinny Sanders, MD    Family History Family History  Problem Relation Age of Onset  . Depression Father   . Diabetes Father   . COPD Mother   . Diabetes Mother   . Macular degeneration Mother   . Diabetes Brother   . Cancer Other        liver  . Diabetes Other     Social History Social History   Tobacco Use  . Smoking status: Never Smoker  . Smokeless tobacco: Never Used  Vaping Use  . Vaping Use: Never used  Substance Use Topics  . Alcohol use: Yes    Alcohol/week: 0.0 - 2.0 standard drinks    Comment: social  . Drug use: No     Allergies   Patient has no known allergies.   Review of Systems Review of Systems  Reason unable to perform ROS: See HPI as above.     Physical Exam Triage Vital Signs ED Triage Vitals  Enc Vitals Group     BP 10/07/19 0825 (!) 161/93      Pulse Rate 10/07/19 0825 89     Resp 10/07/19 0825 18     Temp 10/07/19 0825 97.9 F (36.6 C)     Temp Source 10/07/19 0825 Oral     SpO2 10/07/19 0825 98 %  Weight --      Height --      Head Circumference --      Peak Flow --      Pain Score 10/07/19 0822 3     Pain Loc --      Pain Edu? --      Excl. in [place]? --    No data found.  Updated Vital Signs BP (!) 161/93 (BP Location: Right Arm)   Pulse 89   Temp 97.9 F (36.6 C) (Oral)   Resp 18   LMP 02/02/2006   SpO2 98%   Visual Acuity Right Eye Distance:   Left Eye Distance:   Bilateral Distance:    Right Eye Near:   Left Eye Near:    Bilateral Near:     Physical Exam Constitutional:      General: She is not in acute distress.    Appearance: She is well-developed. She is not ill-appearing, toxic-appearing or diaphoretic.  HENT:     Head: Normocephalic and atraumatic.  Eyes:     Conjunctiva/sclera: Conjunctivae normal.     Pupils: Pupils are equal, round, and reactive to light.  Cardiovascular:     Rate and Rhythm: Normal rate and regular rhythm.  Pulmonary:     Effort: Pulmonary effort is normal. No respiratory distress.     Comments: LCTAB Abdominal:     General: Bowel sounds are normal.     Palpations: Abdomen is soft.     Tenderness: There is no abdominal tenderness. There is right CVA tenderness. There is no left CVA tenderness, guarding or rebound.  Musculoskeletal:     Cervical back: Normal range of motion and neck supple.     Comments: No rashes seen to the back.  Mild tenderness to palpation of right flank/lumbar region.  Skin:    General: Skin is warm and dry.  Neurological:     Mental Status: She is alert and oriented to person, place, and time.  Psychiatric:        Behavior: Behavior normal.        Judgment: Judgment normal.      UC Treatments / Results  Labs (all labs ordered are listed, but only abnormal results are displayed) Labs Reviewed  POCT URINALYSIS DIP (MANUAL ENTRY) -  Abnormal; Notable for the following components:      Result Value   Spec Grav, UA >=1.030 (*)    Blood, UA trace-intact (*)    Leukocytes, UA Moderate (2+) (*)    All other components within normal limits  URINE CULTURE    EKG   Radiology No results found.  Procedures Procedures (including critical care time)  Medications Ordered in UC Medications  ketorolac (TORADOL) 15 MG/ML injection 15 mg (has no administration in time range)    Initial Impression / Assessment and Plan / UC Course  I have reviewed the triage vital signs and the nursing notes.  Pertinent labs & imaging results that were available during my care of the patient were reviewed by me and considered in my medical decision making (see chart for details).    History and exam most consistent with urethral stone.  Worries for urinary tract infection given leukocytes seen.  Patient currently without fever, chills, lower suspicion for pyelonephritis.  Will start Keflex to cover for bacterial infection.  Otherwise Toradol injection in office today for symptomatic management.  Toradol and Norco as needed.  Push fluids.  Return precautions given.  Patient expresses understanding and agrees to plan.  Final Clinical Impressions(s) / UC Diagnoses   Final diagnoses:  Right flank pain    ED Prescriptions    Medication Sig Dispense Auth. Provider   cephALEXin (KEFLEX) 500 MG capsule Take 1 capsule (500 mg total) by mouth 3 (three) times daily. 21 capsule Nicholaos Schippers V, PA-C   ketorolac (TORADOL) 10 MG tablet Take 1 tablet (10 mg total) by mouth every 6 (six) hours as needed. 20 tablet Minal Stuller V, PA-C   HYDROcodone-acetaminophen (NORCO/VICODIN) 5-325 MG tablet Take 1 tablet by mouth every 8 (eight) hours as needed for severe pain. 6 tablet Ok Edwards, PA-C     I have reviewed the PDMP during this encounter.   Ok Edwards, PA-C 10/07/19 1209

## 2019-10-07 NOTE — Discharge Instructions (Signed)
Urine with blood and bacteria. As discussed, most likely from stone, may be causing an infection.  Toradol injection in office today for pain.  Keflex as directed to cover for infection.  Continue Toradol if needed for pain add Norco sparingly if needed for further pain relief. Keep hydrated, urine should be clear to pale yellow in color.  Follow-up with primary care next week for reevaluation if symptoms not improving.  If having sudden worsening in pain, fever, uncontrolled vomiting, unable to urinate, go to the emergency department for further evaluation.

## 2019-10-11 NOTE — Telephone Encounter (Signed)
This encounter was created in error - please disregard.

## 2019-10-12 LAB — URINE CULTURE: Culture: >=100000 — AB

## 2019-10-31 ENCOUNTER — Other Ambulatory Visit: Payer: Self-pay | Admitting: Family Medicine

## 2019-10-31 ENCOUNTER — Other Ambulatory Visit: Payer: Self-pay

## 2019-10-31 ENCOUNTER — Other Ambulatory Visit (INDEPENDENT_AMBULATORY_CARE_PROVIDER_SITE_OTHER): Payer: HMO

## 2019-10-31 DIAGNOSIS — E1165 Type 2 diabetes mellitus with hyperglycemia: Secondary | ICD-10-CM

## 2019-10-31 LAB — COMPREHENSIVE METABOLIC PANEL
ALT: 16 U/L (ref 0–35)
AST: 16 U/L (ref 0–37)
Albumin: 4.2 g/dL (ref 3.5–5.2)
Alkaline Phosphatase: 51 U/L (ref 39–117)
BUN: 12 mg/dL (ref 6–23)
CO2: 30 mEq/L (ref 19–32)
Calcium: 10.3 mg/dL (ref 8.4–10.5)
Chloride: 104 mEq/L (ref 96–112)
Creatinine, Ser: 0.84 mg/dL (ref 0.40–1.20)
GFR: 67.79 mL/min (ref >60.00)
Glucose, Bld: 83 mg/dL (ref 70–99)
Potassium: 4 mEq/L (ref 3.5–5.1)
Sodium: 140 mEq/L (ref 135–145)
Total Bilirubin: 0.5 mg/dL (ref 0.2–1.2)
Total Protein: 6.9 g/dL (ref 6.0–8.3)

## 2019-10-31 LAB — HEMOGLOBIN A1C: Hgb A1c MFr Bld: 8.5 % — ABNORMAL HIGH (ref 4.6–6.5)

## 2019-10-31 LAB — LIPID PANEL
Cholesterol: 163 mg/dL (ref 0–200)
HDL: 52.7 mg/dL (ref >39.00)
LDL Cholesterol: 84 mg/dL (ref 0–99)
NonHDL: 110.33
Total CHOL/HDL Ratio: 3
Triglycerides: 132 mg/dL (ref 0.0–149.0)
VLDL: 26.4 mg/dL (ref 0.0–40.0)

## 2019-11-01 NOTE — Progress Notes (Signed)
No critical labs need to be addressed urgently. We will discuss labs in detail at upcoming office visit.   

## 2019-11-03 ENCOUNTER — Ambulatory Visit (INDEPENDENT_AMBULATORY_CARE_PROVIDER_SITE_OTHER): Payer: HMO | Admitting: Family Medicine

## 2019-11-03 ENCOUNTER — Other Ambulatory Visit: Payer: Self-pay

## 2019-11-03 ENCOUNTER — Encounter: Payer: Self-pay | Admitting: Family Medicine

## 2019-11-03 VITALS — BP 130/72 | HR 97 | Temp 97.5°F | Ht 66.0 in | Wt 182.0 lb

## 2019-11-03 DIAGNOSIS — Z20822 Contact with and (suspected) exposure to covid-19: Secondary | ICD-10-CM | POA: Diagnosis not present

## 2019-11-03 DIAGNOSIS — I152 Hypertension secondary to endocrine disorders: Secondary | ICD-10-CM | POA: Diagnosis not present

## 2019-11-03 DIAGNOSIS — E785 Hyperlipidemia, unspecified: Secondary | ICD-10-CM | POA: Diagnosis not present

## 2019-11-03 DIAGNOSIS — Z0000 Encounter for general adult medical examination without abnormal findings: Secondary | ICD-10-CM

## 2019-11-03 DIAGNOSIS — E1165 Type 2 diabetes mellitus with hyperglycemia: Secondary | ICD-10-CM | POA: Diagnosis not present

## 2019-11-03 DIAGNOSIS — R7989 Other specified abnormal findings of blood chemistry: Secondary | ICD-10-CM

## 2019-11-03 DIAGNOSIS — E1159 Type 2 diabetes mellitus with other circulatory complications: Secondary | ICD-10-CM | POA: Diagnosis not present

## 2019-11-03 NOTE — Progress Notes (Signed)
Chief Complaint  Patient presents with  . Medicare Wellness    History of Present Illness: HPI  The patient presents for annual medicare wellness, complete physical and review of chronic health problems. He/She also has the following acute concerns today: Some issue sleeping given son's wedding coming up. Considering using melatonin.  I have personally reviewed the Medicare Annual Wellness questionnaire and have noted 1. The patient's medical and social history 2. Their use of alcohol, tobacco or illicit drugs 3. Their current medications and supplements 4. The patient's functional ability including ADL's, fall risks, home safety risks and hearing or visual             impairment. 5. Diet and physical activities 6. Evidence for depression or mood disorders 7.         Updated provider list Cognitive evaluation was performed and recorded on pt medicare questionnaire form. The patients weight, height, BMI and visual acuity have been recorded in the chart  I have made referrals, counseling and provided education to the patient based review of the above and I have provided the pt with a written personalized care plan for preventive services.   Documentation of this information was scanned into the electronic record under the media tab.   Advance directives and end of life planning reviewed in detail with patient and documented in EMR. Patient given handout on advance care directives if needed. HCPOA and living will updated if needed.   Hearing Screening   Method: Audiometry   '125Hz'  '250Hz'  '500Hz'  '1000Hz'  '2000Hz'  '3000Hz'  '4000Hz'  '6000Hz'  '8000Hz'   Right ear:   '25 20 20  25    ' Left ear:   '25 25 20  25    ' Vision Screening Comments: Eye Exam with Dr. [place] 03/2019   Fall Risk  11/03/2019 09/13/2018  Falls in the past year? 0 0      Office Visit from 11/03/2019 in [place] at [facility] Total Score 0       Diabetes:   Worsened control given wedding coming up, husband ill,  not eating well. Wants to get back on track. Lab Results  Component Value Date   HGBA1C 8.5 (H) 10/31/2019  Using medications without difficulties: Hypoglycemic episodes: Hyperglycemic episodes: Feet problems: Blood Sugars averaging: eye exam within last year:   primary hyperparathyroidism  Hypertension:    BP Readings from Last 3 Encounters:  11/03/19 130/72  10/07/19 (!) 161/93  05/10/19 138/74  Using medication without problems or lightheadedness:  none Chest pain with exertion:none Edema:none Short of breath:none Average home BPs: Other issues:  Elevated Cholesterol:  Lab Results  Component Value Date   CHOL 163 10/31/2019   HDL 52.70 10/31/2019   LDLCALC 84 10/31/2019   TRIG 132.0 10/31/2019   CHOLHDL 3 10/31/2019  Using medications without problems: Muscle aches:  Diet compliance: moderate Exercise: getting back on track Other complaints:   Diabetic foot exam: Normal inspection No skin breakdown No calluses  Normal DP pulses Normal sensation to light tough and monofilament Nails normal    This visit occurred during the SARS-CoV-2 public health emergency.  Safety protocols were in place, including screening questions prior to the visit, additional usage of staff PPE, and extensive cleaning of exam room while observing appropriate contact time as indicated for disinfecting solutions.   COVID 19 screen:  No recent travel or known exposure to COVID19 The patient denies respiratory symptoms of COVID 19 at this time. The importance of social distancing was discussed today.  Review of Systems  Constitutional: Negative for chills and fever.  HENT: Negative for congestion and ear pain.   Eyes: Negative for pain and redness.  Respiratory: Negative for cough and shortness of breath.   Cardiovascular: Negative for chest pain, palpitations and leg swelling.  Gastrointestinal: Negative for abdominal pain, blood in stool, constipation, diarrhea, nausea and  vomiting.  Genitourinary: Negative for dysuria.  Musculoskeletal: Negative for falls and myalgias.  Skin: Negative for rash.  Neurological: Negative for dizziness.  Psychiatric/Behavioral: Negative for depression. The patient is not nervous/anxious.       Past Medical History:  Diagnosis Date  . Achilles tendonitis   . Allergy   . Anxiety   . Closed fracture of lateral portion of right tibial plateau 07/30/2017  . Depression   . Diabetes mellitus   . H/O renal calculi 10/08/09  . Hyperlipidemia   . Hypertension   . Serum calcium elevated   . STD (sexually transmitted disease)    HSV I & II, IgG reflex positive  . Tibial plateau fracture, right     reports that she has never smoked. She has never used smokeless tobacco. She reports current alcohol use. She reports that she does not use drugs.   Current Outpatient Medications:  .  alendronate (FOSAMAX) 70 MG tablet, Take 1 tablet (70 mg total) by mouth once a week., Disp: 12 tablet, Rfl: 3 .  aspirin 81 MG tablet, Take 81 mg by mouth daily.  , Disp: , Rfl:  .  Blood Glucose Monitoring Suppl (ONETOUCH VERIO REFLECT) w/Device KIT, See admin instructions., Disp: , Rfl:  .  cetirizine (ZYRTEC) 10 MG tablet, Take 1 tablet (10 mg total) by mouth daily., Disp: 30 tablet, Rfl: 11 .  fluticasone (FLONASE) 50 MCG/ACT nasal spray, Place 2 sprays into both nostrils daily., Disp: 16 g, Rfl: 6 .  HYDROcodone-acetaminophen (NORCO/VICODIN) 5-325 MG tablet, Take 1 tablet by mouth every 8 (eight) hours as needed for severe pain., Disp: 6 tablet, Rfl: 0 .  Insulin Pen Needle (B-D ULTRAFINE III SHORT PEN) 31G X 8 MM MISC, USE TO INJECT LEVEMIR DAILY, Disp: 100 each, Rfl: 3 .  ketorolac (TORADOL) 10 MG tablet, Take 1 tablet (10 mg total) by mouth every 6 (six) hours as needed., Disp: 20 tablet, Rfl: 0 .  Lancet Devices (ONE TOUCH DELICA LANCING DEV) MISC, Check blood sugar twice a day and as directed. Dx E11.65, Disp: 1 each, Rfl: 0 .  LANTUS SOLOSTAR 100  UNIT/ML Solostar Pen, INJECT 45 UNITS IN THE SKIN DAILY AT 10PM. IF FASTING BLOOD SUGAR >120 X2 DAYS, INCREASE BY 2 UNITS, Disp: 15 mL, Rfl: 11 .  latanoprost (XALATAN) 0.005 % ophthalmic solution, Place 1 drop into both eyes at bedtime. , Disp: , Rfl:  .  lisinopril (ZESTRIL) 40 MG tablet, TAKE 1 TABLET BY MOUTH EVERY DAY, Disp: 90 tablet, Rfl: 1 .  metFORMIN (GLUCOPHAGE-XR) 500 MG 24 hr tablet, TAKE TWO (2) TABLETS BY MOUTH 2 TIMES DAILY, Disp: 360 tablet, Rfl: 3 .  nitroGLYCERIN (NITRODUR - DOSED IN MG/24 HR) 0.2 mg/hr patch, Apply 1/4 patch to affected area as directed by MD and change every 24 hours., Disp: 30 patch, Rfl: 2 .  ONETOUCH VERIO test strip, USE TO CHECK BLOOD SUGAR THREE TIMES DAILY. DX: E11.65, Disp: 100 strip, Rfl: 11 .  pravastatin (PRAVACHOL) 40 MG tablet, TAKE 1 TABLET BY MOUTH EVERY DAY, Disp: 90 tablet, Rfl: 0 .  RESTASIS 0.05 % ophthalmic emulsion, INSTILL 1 DROP INTO BOTH  EYES TWICE A DAY INSTILL 1 DROP INTO BOTH EYES TWICE DAILY, Disp: , Rfl:  .  sertraline (ZOLOFT) 25 MG tablet, TAKE 1 TABLET BY MOUTH EVERY DAY, Disp: 90 tablet, Rfl: 0 .  SYSTANE ULTRA 0.4-0.3 % SOLN, SMARTSIG:1 Drop(s) In Eye(s) As Needed, Disp: , Rfl:  .  valACYclovir (VALTREX) 500 MG tablet, TAKE 1 TABLET BY MOUTH 2 TIMES DAILY, TAKE FOR 3 DAYS FOR ACUTE FLARE, Disp: 60 tablet, Rfl: 6   Observations/Objective: Blood pressure 130/72, pulse 97, temperature (!) 97.5 F (36.4 C), temperature source Temporal, height '5\' 6"'  (1.676 m), weight 182 lb (82.6 kg), last menstrual period 02/02/2006, SpO2 98 %.  Physical Exam Constitutional:      General: She is not in acute distress.    Appearance: Normal appearance. She is well-developed. She is obese. She is not ill-appearing or toxic-appearing.  HENT:     Head: Normocephalic.     Right Ear: Hearing, tympanic membrane, ear canal and external ear normal. Tympanic membrane is not erythematous, retracted or bulging.     Left Ear: Hearing, tympanic membrane,  ear canal and external ear normal. Tympanic membrane is not erythematous, retracted or bulging.     Nose: No mucosal edema or rhinorrhea.     Right Sinus: No maxillary sinus tenderness or frontal sinus tenderness.     Left Sinus: No maxillary sinus tenderness or frontal sinus tenderness.     Mouth/Throat:     Pharynx: Uvula midline.  Eyes:     General: Lids are normal. Lids are everted, no foreign bodies appreciated.     Conjunctiva/sclera: Conjunctivae normal.     Pupils: Pupils are equal, round, and reactive to light.  Neck:     Thyroid: No thyroid mass or thyromegaly.     Vascular: No carotid bruit.     Trachea: Trachea normal.  Cardiovascular:     Rate and Rhythm: Normal rate and regular rhythm.     Pulses: Normal pulses.     Heart sounds: Normal heart sounds, S1 normal and S2 normal. No murmur heard.  No friction rub. No gallop.   Pulmonary:     Effort: Pulmonary effort is normal. No tachypnea or respiratory distress.     Breath sounds: Normal breath sounds. No decreased breath sounds, wheezing, rhonchi or rales.  Abdominal:     General: Bowel sounds are normal.     Palpations: Abdomen is soft.     Tenderness: There is no abdominal tenderness.  Musculoskeletal:     Cervical back: Normal range of motion and neck supple.  Skin:    General: Skin is warm and dry.     Findings: No rash.  Neurological:     Mental Status: She is alert.  Psychiatric:        Mood and Affect: Mood is not anxious or depressed.        Speech: Speech normal.        Behavior: Behavior normal. Behavior is cooperative.        Thought Content: Thought content normal.        Judgment: Judgment normal.      Assessment and Plan The patient's preventative maintenance and recommended screening tests for an annual wellness exam were reviewed in full today. Brought up to date unless services declined.  Counselled on the importance of diet, exercise, and its role in overall health and mortality. The  patient's FH and SH was reviewed, including their home life, tobacco status, and drug and alcohol status.  Vaccines:  S/P COVID Pap/DVE:  GYN Mammo: GYN Bone Density: BALAN.Marland Kitchen on fosamax.. encouraged pt to return Colon: 2009 repeat in 10 years. Plan cologuard  Smoking Status: none ETOH/ drug TXL:EZVG/JFTN Hep C: done HIV screen: refused   Uncontrolled type 2 diabetes mellitus with hyperglycemia ([place]) Call if interested in continuous blood glucose meter.  get back on trak with healthy eating regular exercise and weight loss. Set up yearly eye exam for diabetes and have the opthalmologist send [place] a copy of the evaluation for the chart.   Hyperlipidemia LDL goal <100 Well controlled. Continue current medication.   Hypertension associated with diabetes ([place]) Well controlled. Continue current medication.   High serum calcium Primary hyperparathyroidism... return for follow up with Dr. Chalmers Cater.     Eliezer Lofts, MD

## 2019-11-03 NOTE — Patient Instructions (Addendum)
Call if interested in continuous blood glucose meter.  get back on trak with healthy eating regular exercise and weight loss. Set up yearly eye exam for diabetes and have the opthalmologist send [place] a copy of the evaluation for the chart.   Follow up with Dr. Chalmers Cater for hyperparathyroidism.   Please stop at the lab to have labs drawn.

## 2019-11-06 ENCOUNTER — Encounter: Payer: Self-pay | Admitting: *Deleted

## 2019-11-06 LAB — SARS COV-2 SEROLOGY(COVID-19)AB(IGG,IGM),IMMUNOASSAY
SARS CoV-2 AB IgG: NEGATIVE
SARS CoV-2 IgM: NEGATIVE

## 2019-11-08 ENCOUNTER — Other Ambulatory Visit: Payer: Self-pay | Admitting: Family Medicine

## 2019-11-08 DIAGNOSIS — E1165 Type 2 diabetes mellitus with hyperglycemia: Secondary | ICD-10-CM

## 2019-11-30 ENCOUNTER — Other Ambulatory Visit: Payer: Self-pay | Admitting: Family Medicine

## 2019-12-05 ENCOUNTER — Encounter: Payer: Self-pay | Admitting: Family Medicine

## 2019-12-11 NOTE — Assessment & Plan Note (Signed)
Call if interested in continuous blood glucose meter.  get back on trak with healthy eating regular exercise and weight loss. Set up yearly eye exam for diabetes and have the opthalmologist send [place] a copy of the evaluation for the chart.

## 2019-12-11 NOTE — Assessment & Plan Note (Signed)
Well controlled. Continue current medication.  

## 2019-12-11 NOTE — Assessment & Plan Note (Signed)
Primary hyperparathyroidism... return for follow up with Dr. Chalmers Cater.

## 2019-12-19 LAB — COLOGUARD: Cologuard: NEGATIVE

## 2019-12-27 ENCOUNTER — Other Ambulatory Visit: Payer: Self-pay

## 2019-12-27 ENCOUNTER — Ambulatory Visit
Admission: RE | Admit: 2019-12-27 | Discharge: 2019-12-27 | Disposition: A | Discharge status: Home / Self Care | Payer: HMO | Source: Ambulatory Visit | Attending: Emergency Medicine | Admitting: Emergency Medicine

## 2019-12-27 VITALS — BP 133/85 | HR 84 | Temp 98.2°F | Resp 18

## 2019-12-27 DIAGNOSIS — R109 Unspecified abdominal pain: Secondary | ICD-10-CM | POA: Diagnosis not present

## 2019-12-27 DIAGNOSIS — N390 Urinary tract infection, site not specified: Secondary | ICD-10-CM | POA: Diagnosis not present

## 2019-12-27 LAB — POCT URINALYSIS DIP (MANUAL ENTRY)
Bilirubin, UA: NEGATIVE
Glucose, UA: NEGATIVE mg/dL
Ketones, POC UA: NEGATIVE mg/dL
Nitrite, UA: NEGATIVE
Protein Ur, POC: NEGATIVE mg/dL
Spec Grav, UA: 1.025 (ref 1.010–1.025)
Urobilinogen, UA: 0.2 E.U./dL
pH, UA: 5.5 (ref 5.0–8.0)

## 2019-12-27 MED ORDER — CEPHALEXIN 500 MG PO CAPS: 500.0000 mg | ORAL_CAPSULE | Freq: Three times a day (TID) | ORAL | 0 refills | Status: AC

## 2019-12-27 NOTE — ED Provider Notes (Signed)
Rod[facility]    CSN: [id number] Arrival date & time: 12/27/19  0849      History   Chief Complaint Chief Complaint  Patient presents with  . Flank Pain    HPI Jean Robinson is a 64 y.o. female.   Patient presents with 10-day history of right flank pain.  She denies fever, chills, abdominal pain, dysuria, hematuria, nausea, vomiting, or other symptoms.  She was seen at [facility] urgent care on 10/07/2019; diagnosed with right flank pain; treated with Keflex, Toradol injection, and Norco; the urine culture grew >100,000 colonies of E. Coli.  Her medical history includes hypertension, diabetes, kidney stones, pyelonephritis, osteoarthritis, depression.    The history is provided by the patient.    Past Medical History:  Diagnosis Date  . Achilles tendonitis   . Allergy   . Anxiety   . Closed fracture of lateral portion of right tibial plateau 07/30/2017  . Depression   . Diabetes mellitus   . H/O renal calculi 10/08/09  . Hyperlipidemia   . Hypertension   . Serum calcium elevated   . STD (sexually transmitted disease)    HSV I & II, IgG reflex positive  . Tibial plateau fracture, right     Patient Active Problem List   Diagnosis Date Noted  . Hypertension associated with diabetes ([place]) 03/17/2019  . Motion sickness 03/17/2019  . Disorder of right Achilles tendon 03/17/2019  . Advice given about COVID-19 virus infection 07/21/2018  . Pyelonephritis 12/28/2017  . High serum calcium 10/01/2017  . Closed fracture of lateral portion of right tibial plateau 07/30/2017  . OA (osteoarthritis) of finger 03/05/2017  . Genital herpes 10/10/2015  . Uncontrolled type 2 diabetes mellitus with hyperglycemia ([place])   . DEPRESSION, MILD 02/27/2008  . Hyperlipidemia LDL goal <100 11/18/2006  . ALLERGIC RHINITIS 11/18/2006    Past Surgical History:  Procedure Laterality Date  . APPENDECTOMY    . CHOLECYSTECTOMY    . ORIF TIBIA PLATEAU Right 07/30/2017   Procedure: OPEN REDUCTION  INTERNAL FIXATION (ORIF) TIBIAL PLATEAU;  Surgeon: Marchia Bond, MD;  Location: [facility];  Service: Orthopedics;  Laterality: Right;  . TONSILLECTOMY      OB History    Gravida  4   Para  4   Term  4   Preterm  0   AB  0   Living  4     SAB  0   TAB  0   Ectopic  0   Multiple  0   Live Births  4            Home Medications    Prior to Admission medications   Medication Sig Start Date End Date Taking? Authorizing Provider  alendronate (FOSAMAX) 70 MG tablet Take 1 tablet (70 mg total) by mouth once a week. 05/09/19  Yes Bedsole, Amy E, MD  cetirizine (ZYRTEC) 10 MG tablet Take 1 tablet (10 mg total) by mouth daily. 10/30/18  Yes Hawks, Christy A, FNP  LANTUS SOLOSTAR 100 UNIT/ML Solostar Pen INJECT 45 UNITS IN THE SKIN DAILY AT 10PM. IF FASTING BLOOD SUGAR >120 X2 DAYS, INCREASE BY 2 UNITS 10/31/19  Yes Bedsole, Amy E, MD  latanoprost (XALATAN) 0.005 % ophthalmic solution Place 1 drop into both eyes at bedtime.  07/26/15  Yes [provider]  lisinopril (ZESTRIL) 40 MG tablet TAKE 1 TABLET BY MOUTH EVERY DAY 08/18/19  Yes Bedsole, Amy E, MD  metFORMIN (GLUCOPHAGE-XR) 500 MG 24 hr tablet  TAKE TWO (2) TABLETS BY MOUTH 2 TIMES DAILY 03/29/19  Yes Bedsole, Amy E, MD  pravastatin (PRAVACHOL) 40 MG tablet TAKE 1 TABLET BY MOUTH EVERY DAY 11/30/19  Yes Bedsole, Amy E, MD  RESTASIS 0.05 % ophthalmic emulsion INSTILL 1 DROP INTO BOTH EYES TWICE A DAY INSTILL 1 DROP INTO BOTH EYES TWICE DAILY 09/13/18  Yes [provider]  sertraline (ZOLOFT) 25 MG tablet TAKE 1 TABLET BY MOUTH EVERY DAY 11/30/19  Yes Bedsole, Amy E, MD  SYSTANE ULTRA 0.4-0.3 % SOLN SMARTSIG:1 Drop(s) In Eye(s) As Needed 10/26/18  Yes [provider]  aspirin 81 MG tablet Take 81 mg by mouth daily.      [provider]  B-D ULTRAFINE III SHORT PEN 31G X 8 MM MISC USE TO INJECT LEVEMIR DAILY 11/09/19   Bedsole, Amy E, MD  Blood Glucose Monitoring Suppl (ONETOUCH VERIO  REFLECT) w/Device KIT See admin instructions. 03/21/19   [provider]  cephALEXin (KEFLEX) 500 MG capsule Take 1 capsule (500 mg total) by mouth 3 (three) times daily for 7 days. 12/27/19 01/03/20  Sharion Balloon, NP  fluticasone (FLONASE) 50 MCG/ACT nasal spray Place 2 sprays into both nostrils daily. 10/30/18   Sharion Balloon, FNP  HYDROcodone-acetaminophen (NORCO/VICODIN) 5-325 MG tablet Take 1 tablet by mouth every 8 (eight) hours as needed for severe pain. 10/07/19   Tasia Catchings, Amy V, PA-C  ketorolac (TORADOL) 10 MG tablet Take 1 tablet (10 mg total) by mouth every 6 (six) hours as needed. 10/07/19   Ok Edwards, PA-C  Lancet Devices (ONE TOUCH DELICA LANCING DEV) MISC Check blood sugar twice a day and as directed. Dx E11.65 05/13/16   Jinny Sanders, MD  nitroGLYCERIN (NITRODUR - DOSED IN MG/24 HR) 0.2 mg/hr patch Apply 1/4 patch to affected area as directed by MD and change every 24 hours. 03/20/19   Copland, Frederico Hamman, MD  ONETOUCH VERIO test strip USE TO CHECK BLOOD SUGAR THREE TIMES DAILY. DX: E11.65 01/17/19   Bedsole, Amy E, MD  valACYclovir (VALTREX) 500 MG tablet TAKE 1 TABLET BY MOUTH 2 TIMES DAILY, TAKE FOR 3 DAYS FOR ACUTE FLARE 10/21/18   Jinny Sanders, MD    Family History Family History  Problem Relation Age of Onset  . Depression Father   . Diabetes Father   . COPD Mother   . Diabetes Mother   . Macular degeneration Mother   . Diabetes Brother   . Cancer Other        liver  . Diabetes Other     Social History Social History   Tobacco Use  . Smoking status: Never Smoker  . Smokeless tobacco: Never Used  Vaping Use  . Vaping Use: Never used  Substance Use Topics  . Alcohol use: Yes    Alcohol/week: 0.0 - 2.0 standard drinks    Comment: social  . Drug use: No     Allergies   Patient has no known allergies.   Review of Systems Review of Systems  Constitutional: Negative for chills and fever.  HENT: Negative for ear pain and sore throat.   Eyes: Negative for  pain and visual disturbance.  Respiratory: Negative for cough and shortness of breath.   Cardiovascular: Negative for chest pain and palpitations.  Gastrointestinal: Negative for abdominal pain, nausea and vomiting.  Genitourinary: Positive for flank pain. Negative for dysuria, hematuria and pelvic pain.  Musculoskeletal: Negative for arthralgias and back pain.  Skin: Negative for color change and rash.  Neurological: Negative for seizures and syncope.  All other systems reviewed and are negative.    Physical Exam Triage Vital Signs ED Triage Vitals  Enc Vitals Group     BP      Pulse      Resp      Temp      Temp src      SpO2      Weight      Height      Head Circumference      Peak Flow      Pain Score      Pain Loc      Pain Edu?      Excl. in [place]?    No data found.  Updated Vital Signs BP 133/85 (BP Location: Left Arm)   Pulse 84   Temp 98.2 F (36.8 C) (Oral)   Resp 18   LMP 02/02/2006   SpO2 96%   Visual Acuity Right Eye Distance:   Left Eye Distance:   Bilateral Distance:    Right Eye Near:   Left Eye Near:    Bilateral Near:     Physical Exam Vitals and nursing note reviewed.  Constitutional:      General: She is not in acute distress.    Appearance: She is well-developed. She is not ill-appearing.  HENT:     Head: Normocephalic and atraumatic.     Mouth/Throat:     Mouth: Mucous membranes are moist.  Eyes:     Conjunctiva/sclera: Conjunctivae normal.  Cardiovascular:     Rate and Rhythm: Normal rate and regular rhythm.     Heart sounds: Normal heart sounds.  Pulmonary:     Effort: Pulmonary effort is normal. No respiratory distress.     Breath sounds: Normal breath sounds.  Abdominal:     Palpations: Abdomen is soft.     Tenderness: There is no abdominal tenderness. There is no right CVA tenderness, left CVA tenderness, guarding or rebound.  Musculoskeletal:     Cervical back: Neck supple.  Skin:    General: Skin is warm and dry.      Findings: No rash.  Neurological:     General: No focal deficit present.     Mental Status: She is alert and oriented to person, place, and time.     Gait: Gait normal.  Psychiatric:        Mood and Affect: Mood normal.        Behavior: Behavior normal.      UC Treatments / Results  Labs (all labs ordered are listed, but only abnormal results are displayed) Labs Reviewed  POCT URINALYSIS DIP (MANUAL ENTRY) - Abnormal; Notable for the following components:      Result Value   Clarity, UA cloudy (*)    Blood, UA trace-intact (*)    Leukocytes, UA Large (3+) (*)    All other components within normal limits  URINE CULTURE    EKG   Radiology No results found.  Procedures Procedures (including critical care time)  Medications Ordered in UC Medications - No data to display  Initial Impression / Assessment and Plan / UC Course  I have reviewed the triage vital signs and the nursing notes.  Pertinent labs & imaging results that were available during my care of the patient were reviewed by me and considered in my medical decision making (see chart for details).   UTI, right flank pain.  Treating with Keflex.  Urine culture pending.  Discussed with  patient we will call her if it shows the need to change or discontinue her antibiotic.  Patient reports her pain is under control with acetaminophen and she does not need additional medication at this time.  Instructed her to follow-up with her PCP if her symptoms are not improving.  Patient agrees to plan of care.   Final Clinical Impressions(s) / UC Diagnoses   Final diagnoses:  Urinary tract infection without hematuria, site unspecified  Acute right flank pain     Discharge Instructions     Take the antibiotic as directed.  The urine culture is pending.  We will call you if it shows the need to change or discontinue your antibiotic.    Follow up with your primary care provider if your symptoms are not improving.        ED Prescriptions    Medication Sig Dispense Auth. Provider   cephALEXin (KEFLEX) 500 MG capsule Take 1 capsule (500 mg total) by mouth 3 (three) times daily for 7 days. 21 capsule Sharion Balloon, NP     I have reviewed the PDMP during this encounter.   Sharion Balloon, NP 12/27/19 [phone]

## 2019-12-27 NOTE — ED Triage Notes (Signed)
Patient c/o RT sided flank pain x 10 days.   Patient denies fever , ABD pain, or dysuria.   Patient has taken Ibuprofen 500 mg at home w/ relief of pain.    Patient endorses history of kidney infection this year (2021).

## 2019-12-27 NOTE — Discharge Instructions (Signed)
Take the antibiotic as directed.  The urine culture is pending.  We will call you if it shows the need to change or discontinue your antibiotic.    Follow up with your primary care provider if your symptoms are not improving.    

## 2019-12-28 LAB — EXTERNAL GENERIC LAB PROCEDURE: COLOGUARD: NEGATIVE

## 2019-12-28 LAB — URINE CULTURE: Culture: NO GROWTH

## 2019-12-28 LAB — COLOGUARD: COLOGUARD: NEGATIVE

## 2020-01-03 ENCOUNTER — Encounter: Payer: Self-pay | Admitting: Family Medicine

## 2020-01-05 ENCOUNTER — Other Ambulatory Visit: Payer: Self-pay

## 2020-01-05 MED ORDER — ONETOUCH DELICA LANCING DEV MISC
0 refills | Status: DC
Start: 2020-01-05 — End: 2022-08-31

## 2020-01-05 NOTE — Patient Outreach (Signed)
  [facility]) Care Management Chronic Special Needs Program    01/05/2020  Name: Jean Robinson, DOB: 07/12/1955  MRN: [id number]   Ms. Orla Estrin is enrolled in a chronic special needs plan for Diabetes. Telephone call to client for CSNP assessment follow up and to update Health risk assessment. Unable to reach. HIPAA compliant voice message left with call back phone number and return call request.   PLAN; RNCM will attempt 2nd telephone outreach to client in 2 weeks.   Quinn Plowman RN,BSN,CCM [facility] Care Management [phone]

## 2020-01-08 ENCOUNTER — Other Ambulatory Visit: Payer: Self-pay

## 2020-01-08 NOTE — Patient Outreach (Signed)
  [facility]) Care Management Chronic Special Needs Program    01/08/2020  Name: Jean Robinson, DOB: 1955/11/07  MRN: [id number]   Jean Robinson is enrolled in a chronic special needs plan for Diabetes. Second telephone call to client for Health risk assessment completion and CSNP assessment update. Unable to reach. HIPAA compliant voice message left with call back phone number and return call request.   PLAN: RNCM will attempt 3rd telephone call to client in 2 weeks.   Quinn Plowman RN,BSN,CCM [facility] Management [phone]

## 2020-01-09 ENCOUNTER — Other Ambulatory Visit: Payer: Self-pay | Admitting: *Deleted

## 2020-01-09 ENCOUNTER — Other Ambulatory Visit: Payer: Self-pay

## 2020-01-09 ENCOUNTER — Ambulatory Visit (INDEPENDENT_AMBULATORY_CARE_PROVIDER_SITE_OTHER): Payer: HMO

## 2020-01-09 DIAGNOSIS — E1165 Type 2 diabetes mellitus with hyperglycemia: Secondary | ICD-10-CM

## 2020-01-09 DIAGNOSIS — Z23 Encounter for immunization: Secondary | ICD-10-CM | POA: Diagnosis not present

## 2020-01-09 MED ORDER — ONETOUCH VERIO REFLECT W/DEVICE KIT: PACK | 0 refills | Status: AC

## 2020-01-09 NOTE — Progress Notes (Signed)
Per orders of Bedsole, injection of PSV-23, given by Aneta Mins, RN. Patient tolerated injection well in R Deltoid.

## 2020-01-10 ENCOUNTER — Other Ambulatory Visit: Payer: Self-pay

## 2020-01-10 NOTE — Patient Outreach (Signed)
[facility] [place] [facility]) Care Management Chronic Special Needs Program  01/10/2020  Name: BRALYNN VELADOR DOB: 07/05/1955  MRN: [id number]  Ms. Merleen Picazo is enrolled in a chronic special needs plan for Diabetes. Telephone call to client for CSNP assessment update and to complete health risk assessment. Unable to reach client. HIPAA compliant voice message left with call back phone number.   Individualized care plan completed based on available data.    Goals Addressed            This Visit's Progress   . COMPLETED: Client understands the importance of follow-up with providers by attending scheduled visits       Primary care provider visit 03/17/19, 10/03/19 and  11/03/19 (Annual wellness exam)     . Client will verbalize knowledge of self management of Hypertension as evidences by BP reading of 140/90 or less; or as defined by provider   On track    Recent blood pressure reading 130/72 on 11/03/2019 RN case manager will send client education article: High Blood pressure in adults.  Continue to check your blood pressure regularly and record readings.  Continue to take your medications as prescribed.      . Client will verbalize self management of hypoglycemia   On track    Continue to follow hypoglycemic (Low blood sugars) as stated below  STEP 1:  Take 15 grams of carbohydrates when your blood sugar is low which includes:  3-4 glucose tablets or 3-4 oz of juice or regular soda or One tube of glucose gel STEP 2:  Recheck your blood sugar in 15 minutes STEP 3:  If your blood sugar is still low at the 15 minute recheck then, go back to STEP 1 and treat again with another 15 grams of carbohydrates Add a protein such as cheese and crackers or peanut butter crackers once blood sugar returns to normal range.   Notify your doctor of frequent low blood sugars.       . Client will verbalize understanding of how long to take prescribed nitroglycerin patch   On track    Unable to reach  client to determine Contact your doctor if you have questions or concerns.      Marland Kitchen HEMOGLOBIN A1C < 7        Client most recent A1c 8.5. Continue to follow diabetes self management actions:  Glucose monitoring per provider recommendation  Check feet daily  Visit provider every 3-6 months as directed  Hbg A1C level every 3-6 months.  Eye Exam yearly  Carbohydrate controlled meal planning  Taking diabetes medication as prescribed by provider  Incorporate exercise into your routine at least 3 days per week or as advised by  Client reports she will start going to the gym again for exercise.      . Maintain timely refills of diabetic medication as prescribed within the year .   On track    Contact your RN case manager if you have questions about your medicines.  Continue to take your medications as prescribed.  Contact your doctor if you have questions or concerns.      . COMPLETED: Obtain annual  Lipid Profile, LDL-C       Lipid profile completed on 10/31/19     . Obtain annual screen for micro albuminuria (urine) , nephropathy (kidney problems)   Not on track    Micro albuminuria screen date not listed.  Discuss having micro albuminuria with your doctor at you next visit.  RN  case manager will send client education article: Urine Albumin test     . Obtain Hemoglobin A1C at least 2 times per year   On track    Per medical record review Hgb A1c completed on 03/10/19 and 10/31/19 Continue to keep your follow up appointments with your provider and have lab work completed as recommended.      . COMPLETED: Visit Primary Care Provider or Endocrinologist at least 2 times per year    On track     Primary care provider visit 03/17/19, 10/03/19 and  11/03/19 (Annual wellness exam)        Plan:  Send successful outreach letter with a copy of their individualized care plan, Send individual care plan to provider and Send educational material  Chronic care management coordinator will  outreach in:  6 Months  [place] [facility] Management will continue to provide services for this member through 02/02/20.  The [employer] care management team will assume care 02/03/2020.   Quinn Plowman RN,BSN,CCM Chronic Care Management Coordinator [facility] [phone]    .

## 2020-01-17 ENCOUNTER — Other Ambulatory Visit: Payer: Self-pay

## 2020-01-17 NOTE — Patient Outreach (Signed)
  [facility]) Care Management Chronic Special Needs Program    01/17/2020  Name: Jean Robinson, DOB: [birth year]  MRN: [id number]   Jean Robinson is enrolled in a chronic special needs plan for Diabetes. [place] Management will continue to provide services for this member through 02/02/20.  The [employer] care management team will assume care 02/03/2020.   Quinn Plowman RN,BSN,CCM [facility] Management [phone]

## 2020-01-22 ENCOUNTER — Other Ambulatory Visit: Payer: Self-pay | Admitting: Family Medicine

## 2020-02-06 ENCOUNTER — Other Ambulatory Visit: Payer: Self-pay

## 2020-02-07 DIAGNOSIS — B351 Tinea unguium: Secondary | ICD-10-CM | POA: Diagnosis not present

## 2020-02-07 DIAGNOSIS — L821 Other seborrheic keratosis: Secondary | ICD-10-CM | POA: Diagnosis not present

## 2020-02-07 DIAGNOSIS — L814 Other melanin hyperpigmentation: Secondary | ICD-10-CM | POA: Diagnosis not present

## 2020-02-07 DIAGNOSIS — L578 Other skin changes due to chronic exposure to nonionizing radiation: Secondary | ICD-10-CM | POA: Diagnosis not present

## 2020-02-07 DIAGNOSIS — D225 Melanocytic nevi of trunk: Secondary | ICD-10-CM | POA: Diagnosis not present

## 2020-02-09 ENCOUNTER — Other Ambulatory Visit: Payer: Self-pay | Admitting: Family Medicine

## 2020-02-09 DIAGNOSIS — I10 Essential (primary) hypertension: Secondary | ICD-10-CM

## 2020-04-11 MED ORDER — ONDANSETRON HCL 4 MG PO TABS: 4.0000 mg | ORAL_TABLET | Freq: Three times a day (TID) | ORAL | 0 refills | Status: DC | PRN

## 2020-04-14 ENCOUNTER — Other Ambulatory Visit: Payer: Self-pay | Admitting: Family Medicine

## 2020-04-15 NOTE — Telephone Encounter (Signed)
Left voice message to call the office  

## 2020-04-15 NOTE — Telephone Encounter (Signed)
Please schedule diabetic follow up with Dr. Diona Browner.  Per last AVS: Return in about 3 months (around 02/03/2020).

## 2020-04-18 ENCOUNTER — Telehealth: Payer: Self-pay | Admitting: *Deleted

## 2020-04-18 NOTE — Telephone Encounter (Signed)
Received fax from [facility] stating patient is requesting Rx for Allegra.  Zyrtec is on current medication list.  Last office visit 11/03/2019.  No future appointments.  Please advise.

## 2020-04-19 NOTE — Telephone Encounter (Signed)
Tried to call Jean Robinson to verify that she is asking for Allegra vs. Zyrtec but there was no answer and voicemail is full so I was unable to leave a message.

## 2020-04-22 NOTE — Telephone Encounter (Signed)
Called patient voice mail is full

## 2020-04-25 NOTE — Telephone Encounter (Signed)
Left voice message to call the office  

## 2020-04-29 NOTE — Telephone Encounter (Signed)
Left voice message. Letter sent

## 2020-05-29 DIAGNOSIS — E11319 Type 2 diabetes mellitus with unspecified diabetic retinopathy without macular edema: Secondary | ICD-10-CM | POA: Diagnosis not present

## 2020-05-29 LAB — HM DIABETES EYE EXAM

## 2020-06-03 ENCOUNTER — Encounter: Payer: Self-pay | Admitting: Family Medicine

## 2020-06-07 ENCOUNTER — Telehealth (INDEPENDENT_AMBULATORY_CARE_PROVIDER_SITE_OTHER): Payer: HMO | Admitting: Family Medicine

## 2020-06-07 ENCOUNTER — Encounter: Payer: Self-pay | Admitting: Family Medicine

## 2020-06-07 ENCOUNTER — Telehealth: Payer: Self-pay

## 2020-06-07 VITALS — Temp 97.3°F | Ht 66.0 in | Wt 180.0 lb

## 2020-06-07 DIAGNOSIS — E1165 Type 2 diabetes mellitus with hyperglycemia: Secondary | ICD-10-CM | POA: Diagnosis not present

## 2020-06-07 DIAGNOSIS — U071 COVID-19: Secondary | ICD-10-CM | POA: Diagnosis not present

## 2020-06-07 DIAGNOSIS — E1159 Type 2 diabetes mellitus with other circulatory complications: Secondary | ICD-10-CM | POA: Diagnosis not present

## 2020-06-07 DIAGNOSIS — I152 Hypertension secondary to endocrine disorders: Secondary | ICD-10-CM

## 2020-06-07 MED ORDER — NIRMATRELVIR/RITONAVIR (PAXLOVID)TABLET: 3.0000 | ORAL_TABLET | Freq: Two times a day (BID) | ORAL | 0 refills | Status: AC

## 2020-06-07 NOTE — Progress Notes (Signed)
   Jean Robinson is a 65 y.o. female who presents today for a virtual office visit.  Assessment/Plan:  New/Acute Problems: COVID-19 Patient has had relatively mild course of illness thus far.  She is at increased risk for severe COVID due to her age and comorbidities including diabetes and hypertension.  We discussed treatment options.  She is interested in antiviral medications.  Her husband is being treated with the same.  We will start paxlovid.  Do not see any contraindication for this based on her medications advised her to follow-up with her pharmacist as well.  Encourage good oral hydration.  She can continue over-the-counter meds as needed for symptom control.  Discussed reasons to return to care.  She will let us know if not improving.  Chronic Problems Addressed Today: Type 2 diabetes Not controlled.  Last A1c 8.5.  She is on metformin.   Hypertension Elevated recently due to her acute illness  he will continue home monitoring -hopefully should improve as her acute illness resolves.  Continue current dose of lisinopril 40 mg daily.    Subjective:  HPI:  Patient here with covid symptoms. Started 3 days ago. Symptoms include chills, diarrhea, body aches, headache, cough, and sore throat.  Husband has been sick with similar symptoms. Home test was positive. No chest pain or shortness of breath.  Symptoms are overall stable.       Objective/Observations  Physical Exam: Gen: NAD, resting comfortably Pulm: Normal work of breathing Neuro: Grossly normal, moves all extremities Psych: Normal affect and thought content  Virtual Visit via Video   I connected with Jean Robinson on 06/07/20 at  3:40 PM EDT by a video enabled telemedicine application and verified that I am speaking with the correct person using two identifiers. The limitations of evaluation and management by telemedicine and the availability of in person appointments were discussed. The patient expressed understanding and  agreed to proceed.   Patient location: Home Provider location: [place] participating in the virtual visit: Myself and Patient     Algis Greenhouse. Jerline Pain, MD 06/07/2020 2:27 PM

## 2020-06-07 NOTE — Telephone Encounter (Signed)
Noted. Appreciate Dr. Jerline Pain seeing her.

## 2020-06-07 NOTE — Telephone Encounter (Signed)
Pt said she tested + covid with home test this morning; pts symptoms started on 06/04/20 with ?fever Pt felt warm at times, chillls,achy watery diarrhea (last diarrhea was 06/06/20) NO BM today. Head congestion and runny nose with dry cough, H/A and scratchy throat. No SOB,CP, wheezing or dizziness. Earlier today BP 165/69 P around 120 but pt said P usually 90 - 100. Pt is in no distress with breathing but pt is a diabetic and wants to know should she be taking anything. Pt has video visit with Dr Dimas Chyle 06/07/20 at 3:40. Pt will have vitals read. Pt is self quarantining,drinking plenty of fluid,resting and will take tylenol for fever. Sending note to Dr Diona Browner as PCP and Dr Jerline Pain.

## 2020-06-11 ENCOUNTER — Ambulatory Visit: Payer: HMO | Admitting: Family Medicine

## 2020-06-19 ENCOUNTER — Encounter: Payer: Self-pay | Admitting: Obstetrics and Gynecology

## 2020-06-19 DIAGNOSIS — M8589 Other specified disorders of bone density and structure, multiple sites: Secondary | ICD-10-CM | POA: Diagnosis not present

## 2020-06-19 DIAGNOSIS — Z1231 Encounter for screening mammogram for malignant neoplasm of breast: Secondary | ICD-10-CM | POA: Diagnosis not present

## 2020-06-24 ENCOUNTER — Ambulatory Visit (INDEPENDENT_AMBULATORY_CARE_PROVIDER_SITE_OTHER): Payer: HMO

## 2020-06-24 ENCOUNTER — Other Ambulatory Visit: Payer: Self-pay

## 2020-06-24 ENCOUNTER — Ambulatory Visit
Admission: EM | Admit: 2020-06-24 | Discharge: 2020-06-24 | Disposition: A | Discharge status: Home / Self Care | Payer: HMO | Attending: Emergency Medicine | Admitting: Emergency Medicine

## 2020-06-24 DIAGNOSIS — M549 Dorsalgia, unspecified: Secondary | ICD-10-CM | POA: Insufficient documentation

## 2020-06-24 DIAGNOSIS — N390 Urinary tract infection, site not specified: Secondary | ICD-10-CM

## 2020-06-24 DIAGNOSIS — R109 Unspecified abdominal pain: Secondary | ICD-10-CM | POA: Diagnosis not present

## 2020-06-24 LAB — POCT URINALYSIS DIP (MANUAL ENTRY)
Bilirubin, UA: NEGATIVE
Blood, UA: NEGATIVE
Glucose, UA: NEGATIVE mg/dL
Ketones, POC UA: NEGATIVE mg/dL
Nitrite, UA: NEGATIVE
Protein Ur, POC: NEGATIVE mg/dL
Spec Grav, UA: <=1.005 — AB (ref 1.010–1.025)
Urobilinogen, UA: 0.2 E.U./dL
pH, UA: 5 (ref 5.0–8.0)

## 2020-06-24 MED ORDER — IBUPROFEN 600 MG PO TABS: 600.0000 mg | ORAL_TABLET | Freq: Four times a day (QID) | ORAL | 0 refills | Status: DC | PRN

## 2020-06-24 MED ORDER — CEPHALEXIN 500 MG PO CAPS: 500.0000 mg | ORAL_CAPSULE | Freq: Four times a day (QID) | ORAL | 0 refills | Status: AC

## 2020-06-24 NOTE — Discharge Instructions (Addendum)
Finish the Keflex unless a provider tells you to stop.  I sent your urine off for culture to confirm the diagnosis and make sure that we have you on the right antibiotic.  Take 600 mg of ibuprofen and 1000 mg of Tylenol together 3-4 times a day as needed for pain.  There are no definitive kidney stone seen on the right side although you may have some small kidney stones on the left.  Continue pushing fluids.  The ibuprofen will help with the pain and if there is a kidney stone, will relax the ureter smooth muscle to help it pass.

## 2020-06-24 NOTE — ED Provider Notes (Signed)
HPI  SUBJECTIVE:  Jean Robinson is a 65 y.o. female who presents with constant "right kidney pain" for the past 4 to 5 days.  She describes it as dull, aching.  It does not migrate or radiate.  She states this does not feel like previous nephrolithiasis.  No nausea, vomiting, fevers, dysuria, urgency, frequency, cloudy or odorous urine, hematuria.  No trauma to the back.  No recent heavy lifting.  She has had diarrhea lately.  No abdominal, pelvic pain.  No saddle anesthesia, leg weakness, urinary or fecal incontinence, urinary retention.  She had a normal bowel movement earlier today which did not change her pain.  No antibiotics in the past month.  No antipyretic in the past 6 hours.  She has tried 1000 mg of Tylenol with some improvement in her symptoms.  She has also tried heat.  No aggravating factors.  It is not associated with urination, defecation, movement, bending forward.  She states this feels similar to previous UTI.  Past medical history of diabetes, hypertension, hyperlipidemia, nonobstructive nephrolithiasis, E. coli UTI that was pansensitive, pyelonephritis, COVID earlier this month.  No history of back injury, degenerative disc disease, osteoarthritis of the spine. PMD.  [facility]  Past Medical History:  Diagnosis Date  . Achilles tendonitis   . Allergy   . Anxiety   . Closed fracture of lateral portion of right tibial plateau 07/30/2017  . Depression   . Diabetes mellitus   . H/O renal calculi 10/08/09  . Hyperlipidemia   . Hypertension   . Serum calcium elevated   . STD (sexually transmitted disease)    HSV I & II, IgG reflex positive  . Tibial plateau fracture, right     Past Surgical History:  Procedure Laterality Date  . APPENDECTOMY    . CHOLECYSTECTOMY    . ORIF TIBIA PLATEAU Right 07/30/2017   Procedure: OPEN REDUCTION INTERNAL FIXATION (ORIF) TIBIAL PLATEAU;  Surgeon: Marchia Bond, MD;  Location: [facility];  Service:  Orthopedics;  Laterality: Right;  . TONSILLECTOMY      Family History  Problem Relation Age of Onset  . Depression Father   . Diabetes Father   . COPD Mother   . Diabetes Mother   . Macular degeneration Mother   . Diabetes Brother   . Cancer Other        liver  . Diabetes Other     Social History   Tobacco Use  . Smoking status: Never Smoker  . Smokeless tobacco: Never Used  Vaping Use  . Vaping Use: Never used  Substance Use Topics  . Alcohol use: Yes    Alcohol/week: 0.0 - 2.0 standard drinks    Comment: social  . Drug use: No    No current facility-administered medications for this encounter.  Current Outpatient Medications:  .  cephALEXin (KEFLEX) 500 MG capsule, Take 1 capsule (500 mg total) by mouth 4 (four) times daily for 10 days., Disp: 40 capsule, Rfl: 0 .  ibuprofen (ADVIL) 600 MG tablet, Take 1 tablet (600 mg total) by mouth every 6 (six) hours as needed., Disp: 30 tablet, Rfl: 0 .  alendronate (FOSAMAX) 70 MG tablet, TAKE 1 TABLET (70 MG TOTAL) BY MOUTH ONCE A WEEK., Disp: 12 tablet, Rfl: 3 .  aspirin 81 MG tablet, Take 81 mg by mouth daily., Disp: , Rfl:  .  B-D ULTRAFINE III SHORT PEN 31G X 8 MM MISC, USE TO INJECT LEVEMIR DAILY, Disp: 100 each, Rfl:  3 .  Blood Glucose Monitoring Suppl (ONETOUCH VERIO REFLECT) w/Device KIT, Check blood sugar twice a day and as directed. Dx E11.65, Disp: 1 kit, Rfl: 0 .  cetirizine (ZYRTEC) 10 MG tablet, Take 1 tablet (10 mg total) by mouth daily., Disp: 30 tablet, Rfl: 11 .  fluticasone (FLONASE) 50 MCG/ACT nasal spray, Place 2 sprays into both nostrils daily., Disp: 16 g, Rfl: 6 .  Lancet Devices (ONE TOUCH DELICA LANCING DEV) MISC, Check blood sugar twice a day and as directed. Dx E11.65, Disp: 1 each, Rfl: 0 .  LANTUS SOLOSTAR 100 UNIT/ML Solostar Pen, INJECT 45 UNITS IN THE SKIN DAILY AT 10PM. IF FASTING BLOOD SUGAR >120 X2 DAYS, INCREASE BY 2 UNITS, Disp: 15 mL, Rfl: 11 .  latanoprost (XALATAN) 0.005 % ophthalmic  solution, Place 1 drop into both eyes at bedtime. , Disp: , Rfl:  .  lisinopril (ZESTRIL) 40 MG tablet, TAKE 1 TABLET BY MOUTH EVERY DAY, Disp: 90 tablet, Rfl: 1 .  metFORMIN (GLUCOPHAGE-XR) 500 MG 24 hr tablet, TAKE TWO (2) TABLETS BY MOUTH 2 TIMES DAILY, Disp: 360 tablet, Rfl: 1 .  nitroGLYCERIN (NITRODUR - DOSED IN MG/24 HR) 0.2 mg/hr patch, Apply 1/4 patch to affected area as directed by MD and change every 24 hours., Disp: 30 patch, Rfl: 2 .  ondansetron (ZOFRAN) 4 MG tablet, Take 1 tablet (4 mg total) by mouth every 8 (eight) hours as needed for nausea or vomiting., Disp: 20 tablet, Rfl: 0 .  ONETOUCH VERIO test strip, USE TO CHECK BLOOD SUGAR THREE TIMES DAILY. DX: E11.65, Disp: 100 strip, Rfl: 11 .  pravastatin (PRAVACHOL) 40 MG tablet, TAKE 1 TABLET BY MOUTH EVERY DAY, Disp: 90 tablet, Rfl: 3 .  RESTASIS 0.05 % ophthalmic emulsion, INSTILL 1 DROP INTO BOTH EYES TWICE A DAY INSTILL 1 DROP INTO BOTH EYES TWICE DAILY, Disp: , Rfl:  .  sertraline (ZOLOFT) 25 MG tablet, TAKE 1 TABLET BY MOUTH EVERY DAY, Disp: 90 tablet, Rfl: 1 .  SYSTANE ULTRA 0.4-0.3 % SOLN, SMARTSIG:1 Drop(s) In Eye(s) As Needed, Disp: , Rfl:  .  valACYclovir (VALTREX) 500 MG tablet, TAKE 1 TABLET BY MOUTH 2 TIMES DAILY, TAKE FOR 3 DAYS FOR ACUTE FLARE, Disp: 60 tablet, Rfl: 6  No Known Allergies   ROS  As noted in HPI.   Physical Exam  BP (!) 163/87 (BP Location: Left Arm)   Pulse 82   Temp 97.8 F (36.6 C) (Oral)   Resp 18   LMP 02/02/2006   SpO2 96%   Constitutional: Well developed, well nourished, no acute distress Eyes:  EOMI, conjunctiva normal bilaterally HENT: Normocephalic, atraumatic,mucus membranes moist Respiratory: Normal inspiratory effort Cardiovascular: Normal rate GI: nondistended. No suprapubic, flank tenderness skin: No rash, skin intact Musculoskeletal: Right CVAT. - paralumbar tenderness,  - muscle spasm. No L-spine bony tenderness. Bilateral lower extremities nontender, baseline ROM  with intact DP pulses. No pain with int/ext rotation flex/extension hips bilaterally. SLR neg bilaterally. Sensation baseline light touch bilaterally for Pt, Motor symmetric bilateral 5/5 hip flexion, quadriceps, hamstrings, EHL, foot dorsiflexion, foot plantarflexion, gait normal Neurologic: Alert & oriented x 3, no focal neuro deficits Psychiatric: Speech and behavior appropriate   ED Course   Medications - No data to display  Orders Placed This Encounter  Procedures  . Urine Culture    Standing Status:   Standing    Number of Occurrences:   1    Order Specific Question:   List patient's active antibiotics  Answer:   keflex  . DG Abd 1 View    Standing Status:   Standing    Number of Occurrences:   1    Order Specific Question:   Reason for Exam (SYMPTOM  OR DIAGNOSIS REQUIRED)    Answer:   Right flank/kidney pain.  Rule out nephrolithiasis  . POCT urinalysis dipstick    Standing Status:   Standing    Number of Occurrences:   1    Results for orders placed or performed during the hospital encounter of 06/24/20 (from the past 24 hour(s))  POCT urinalysis dipstick     Status: Abnormal   Collection Time: 06/24/20  9:07 AM  Result Value Ref Range   Color, UA yellow yellow   Clarity, UA clear clear   Glucose, UA negative negative mg/dL   Bilirubin, UA negative negative   Ketones, POC UA negative negative mg/dL   Spec Grav, UA <=1.005 (A) 1.010 - 1.025   Blood, UA negative negative   pH, UA 5.0 5.0 - 8.0   Protein Ur, POC negative negative mg/dL   Urobilinogen, UA 0.2 0.2 or 1.0 E.U./dL   Nitrite, UA Negative Negative   Leukocytes, UA Small (1+) (A) Negative   DG Abd 1 View  Result Date: 06/24/2020 CLINICAL DATA:  Right-sided flank pain for several days EXAM: ABDOMEN - 1 VIEW COMPARISON:  09/04/2015 FINDINGS: Scattered large and small bowel gas is noted. Scattered vascular calcifications are identified. A few tiny calcific densities are noted projecting over the left  kidney. Multiple phleboliths are seen. No definitive ureteral stones are noted. IMPRESSION: Findings suggestive of tiny left renal calculi. No definitive right-sided calculi are noted. CT urography may be helpful as clinically indicated. Electronically Signed   By: Inez Catalina M.D.   On: 06/24/2020 09:12    ED Clinical Impression  1. Urinary tract infection without hematuria, site unspecified   2. Other acute back pain     ED Assessment/Plan  Previous records reviewed.  As noted in HPI.  KUB: No definitive right-sided calculi noted.  Suggestive left-sided renal calculi.  See radiology report for detail  Presentation suggestive of nephrolithiasis versus a complicated urinary tract infection..  Not appear musculoskeletal.  No evidence of spinal cord involvement based on H&P.  Will check KUB, UA.  No hematuria.  She has small leukocytes.  We will send this off for culture to confirm antibiotic choice and diagnosis of UTI.  Will treat as a complicated UTI with Keflex given the CVA tenderness.  She has no systemic symptoms such as fevers, body aches, thus will not treat as a pyelonephritis. No definitive right-sided calculi noted on KUB, she may have some small stones on the left.   Home with with Tylenol, ibuprofen, Keflex.  Discussed labs, medical decision-making, and plan for follow-up with the patient.  Discussed signs and symptoms that should prompt return to the emergency department.  Patient agrees with plan.    Meds ordered this encounter  Medications  . cephALEXin (KEFLEX) 500 MG capsule    Sig: Take 1 capsule (500 mg total) by mouth 4 (four) times daily for 10 days.    Dispense:  40 capsule    Refill:  0  . ibuprofen (ADVIL) 600 MG tablet    Sig: Take 1 tablet (600 mg total) by mouth every 6 (six) hours as needed.    Dispense:  30 tablet    Refill:  0    *This clinic note was created using Dragon dictation  software. Therefore, there may be occasional mistakes despite  careful proofreading.  ?     Melynda Ripple, MD 06/24/20 1032

## 2020-06-24 NOTE — ED Triage Notes (Signed)
Pt c/o rt flank pain since Thursday. States hx of kidney stones and this feels different. Denies urinary sx's.

## 2020-06-25 ENCOUNTER — Telehealth: Payer: Self-pay | Admitting: Obstetrics and Gynecology

## 2020-06-25 NOTE — Telephone Encounter (Signed)
Please contact patient with results of BMD from [facility] 06/19/20.   Results show osteopenia of the hips.  The spine is normal.   There was improvement in her bone density of both hips and the spine since her last bone density in 2020.   She is currently on Fosamax from her PCP.  She will want to reach out to her PCP to see if there are any recommended changes for her care.

## 2020-06-26 LAB — URINE CULTURE: Culture: <10000 — AB

## 2020-07-31 ENCOUNTER — Telehealth: Payer: Self-pay | Admitting: Family Medicine

## 2020-07-31 DIAGNOSIS — E1165 Type 2 diabetes mellitus with hyperglycemia: Secondary | ICD-10-CM

## 2020-07-31 NOTE — Telephone Encounter (Signed)
-----   Message from Ellamae Sia sent at 07/15/2020 11:51 AM EDT ----- Regarding: Lab orders for Thursday, 6.30.22 Patient is scheduled for CPX labs, please order future labs, Thanks , Karna Christmas

## 2020-08-01 ENCOUNTER — Other Ambulatory Visit: Payer: Self-pay

## 2020-08-01 ENCOUNTER — Other Ambulatory Visit (INDEPENDENT_AMBULATORY_CARE_PROVIDER_SITE_OTHER): Payer: HMO

## 2020-08-01 DIAGNOSIS — E1165 Type 2 diabetes mellitus with hyperglycemia: Secondary | ICD-10-CM

## 2020-08-01 LAB — LIPID PANEL
Cholesterol: 143 mg/dL (ref 0–200)
HDL: 46.3 mg/dL (ref >39.00)
LDL Cholesterol: 71 mg/dL (ref 0–99)
NonHDL: 96.73
Total CHOL/HDL Ratio: 3
Triglycerides: 130 mg/dL (ref 0.0–149.0)
VLDL: 26 mg/dL (ref 0.0–40.0)

## 2020-08-01 LAB — COMPREHENSIVE METABOLIC PANEL
ALT: 20 U/L (ref 0–35)
AST: 17 U/L (ref 0–37)
Albumin: 4.1 g/dL (ref 3.5–5.2)
Alkaline Phosphatase: 66 U/L (ref 39–117)
BUN: 19 mg/dL (ref 6–23)
CO2: 27 mEq/L (ref 19–32)
Calcium: 9.8 mg/dL (ref 8.4–10.5)
Chloride: 105 mEq/L (ref 96–112)
Creatinine, Ser: 0.98 mg/dL (ref 0.40–1.20)
GFR: 59.95 mL/min — ABNORMAL LOW (ref >60.00)
Glucose, Bld: 101 mg/dL — ABNORMAL HIGH (ref 70–99)
Potassium: 4.3 mEq/L (ref 3.5–5.1)
Sodium: 138 mEq/L (ref 135–145)
Total Bilirubin: 0.4 mg/dL (ref 0.2–1.2)
Total Protein: 6.9 g/dL (ref 6.0–8.3)

## 2020-08-01 LAB — HEMOGLOBIN A1C: Hgb A1c MFr Bld: 9.7 % — ABNORMAL HIGH (ref 4.6–6.5)

## 2020-08-01 NOTE — Progress Notes (Signed)
No critical labs need to be addressed urgently. We will discuss labs in detail at upcoming office visit.   

## 2020-08-08 ENCOUNTER — Encounter: Payer: Self-pay | Admitting: Family Medicine

## 2020-08-08 ENCOUNTER — Ambulatory Visit (INDEPENDENT_AMBULATORY_CARE_PROVIDER_SITE_OTHER): Payer: HMO | Admitting: Family Medicine

## 2020-08-08 ENCOUNTER — Other Ambulatory Visit: Payer: Self-pay

## 2020-08-08 ENCOUNTER — Telehealth: Payer: Self-pay | Admitting: Family Medicine

## 2020-08-08 ENCOUNTER — Other Ambulatory Visit: Payer: Self-pay | Admitting: Family Medicine

## 2020-08-08 VITALS — BP 116/74 | HR 94 | Temp 98.3°F | Ht 66.0 in | Wt 183.0 lb

## 2020-08-08 DIAGNOSIS — Z0000 Encounter for general adult medical examination without abnormal findings: Secondary | ICD-10-CM | POA: Diagnosis not present

## 2020-08-08 DIAGNOSIS — E1165 Type 2 diabetes mellitus with hyperglycemia: Secondary | ICD-10-CM | POA: Diagnosis not present

## 2020-08-08 DIAGNOSIS — E785 Hyperlipidemia, unspecified: Secondary | ICD-10-CM

## 2020-08-08 DIAGNOSIS — I10 Essential (primary) hypertension: Secondary | ICD-10-CM

## 2020-08-08 DIAGNOSIS — E1169 Type 2 diabetes mellitus with other specified complication: Secondary | ICD-10-CM

## 2020-08-08 DIAGNOSIS — I152 Hypertension secondary to endocrine disorders: Secondary | ICD-10-CM | POA: Diagnosis not present

## 2020-08-08 DIAGNOSIS — E1159 Type 2 diabetes mellitus with other circulatory complications: Secondary | ICD-10-CM | POA: Diagnosis not present

## 2020-08-08 LAB — HM DIABETES FOOT EXAM

## 2020-08-08 MED ORDER — LISINOPRIL 40 MG PO TABS: 40.0000 mg | ORAL_TABLET | Freq: Every day | ORAL | 3 refills | Status: DC

## 2020-08-08 MED ORDER — FREESTYLE LIBRE 14 DAY SENSOR MISC: 11 refills | Status: DC

## 2020-08-08 MED ORDER — APIDRA SOLOSTAR 100 UNIT/ML ~~LOC~~ SOPN: PEN_INJECTOR | SUBCUTANEOUS | 1 refills | Status: DC

## 2020-08-08 MED ORDER — FREESTYLE LIBRE 14 DAY READER DEVI: 0 refills | Status: DC

## 2020-08-08 MED ORDER — INSULIN ASPART FLEXPEN 100 UNIT/ML ~~LOC~~ SOPN: PEN_INJECTOR | SUBCUTANEOUS | 11 refills | Status: DC

## 2020-08-08 MED ORDER — METFORMIN HCL ER 500 MG PO TB24
ORAL_TABLET | ORAL | 3 refills | Status: DC
Start: 2020-08-08 — End: 2021-08-24

## 2020-08-08 MED ORDER — BD PEN NEEDLE SHORT U/F 31G X 8 MM MISC: 3 refills | Status: DC

## 2020-08-08 NOTE — Progress Notes (Signed)
Patient ID: Jean Robinson, female    DOB: [birth year], 65 y.o.   MRN: [id number]  This visit was conducted in person.  BP 116/74   Pulse 94   Temp 98.3 F (36.8 C) (Temporal)   Ht '5\' 6"'  (1.676 m)   Wt 183 lb (83 kg)   LMP 02/02/2006   SpO2 98%   BMI 29.54 kg/m    CC: Chief Complaint  Patient presents with   Annual Exam    Subjective:   HPI: DAIJAH Robinson is a 65 y.o. female presenting on 08/08/2020 for Annual Exam  I have personally reviewed the Medicare Annual Wellness questionnaire and have noted 1. The patient's medical and social history 2. Their use of alcohol, tobacco or illicit drugs 3. Their current medications and supplements 4. The patient's functional ability including ADL's, fall risks, home safety risks and hearing or visual             impairment. 5. Diet and physical activities 6. Evidence for depression or mood disorders 7.         Updated provider list Cognitive evaluation was performed and recorded on pt medicare questionnaire form. The patients weight, height, BMI and visual acuity have been recorded in the chart   I have made referrals, counseling and provided education to the patient based review of the above and I have provided the pt with a written personalized care plan for preventive services.   Documentation of this information was scanned into the electronic record under the media tab.   Advance directives and end of life planning reviewed in detail with patient and documented in EMR. Patient given handout on advance care directives if needed. HCPOA and living will updated if needed.  Hearing Screening   '500Hz'  '1000Hz'  '2000Hz'  '4000Hz'   Right ear '20 20 20 20  ' Left ear '20 20 20 20  ' Vision Screening - Comments:: See's Eye doctor on highly regular basis due to [place] Office Visit from 11/03/2019 in [place] at [facility] Total Score 0      No falls in last year.  08/08/20    Diabetes:   Inadequate control on  metformin 500 mg 2 tabs BID ( Max)  Lantus 45 UNITS daily ( occ increases to 50 units)  She would like to try continuous glucose monitor Lab Results  Component Value Date   HGBA1C 9.7 (H) 08/01/2020  Using medications without difficulties: Hypoglycemic episodes:none Hyperglycemic episodes:yes Feet problems: none Blood Sugars averaging: FBS 110-120 eye exam within last year:  Elevated Cholesterol:  At goal on pravastatin 40 mg daily Lab Results  Component Value Date   CHOL 143 08/01/2020   HDL 46.30 08/01/2020   LDLCALC 71 08/01/2020   TRIG 130.0 08/01/2020   CHOLHDL 3 08/01/2020    Using medications without problems: Muscle aches:  Diet compliance: done Exercise: walking Other complaints:   Hypertension:   BP Readings from Last 3 Encounters:  08/08/20 116/74  06/24/20 (!) 163/87  12/27/19 133/85  Using medication without problems or lightheadedness:  none Chest pain with exertion:none Edema: none Short of breath:none Average home BPs: Other issues:   Relevant past medical, surgical, family and social history reviewed and updated as indicated. Interim medical history since our last visit reviewed. Allergies and medications reviewed and updated. Outpatient Medications Prior to Visit  Medication Sig Dispense Refill   alendronate (FOSAMAX) 70 MG tablet TAKE 1 TABLET (70 MG TOTAL) BY MOUTH ONCE A WEEK.  12 tablet 3   aspirin 81 MG tablet Take 81 mg by mouth daily.     B-D ULTRAFINE III SHORT PEN 31G X 8 MM MISC USE TO INJECT LEVEMIR DAILY 100 each 3   Blood Glucose Monitoring Suppl (ONETOUCH VERIO REFLECT) w/Device KIT Check blood sugar twice a day and as directed. Dx E11.65 1 kit 0   cetirizine (ZYRTEC) 10 MG tablet Take 1 tablet (10 mg total) by mouth daily. 30 tablet 11   fluticasone (FLONASE) 50 MCG/ACT nasal spray Place 2 sprays into both nostrils daily. 16 g 6   ibuprofen (ADVIL) 600 MG tablet Take 1 tablet (600 mg total) by mouth every 6 (six) hours as needed. 30  tablet 0   Lancet Devices (ONE TOUCH DELICA LANCING DEV) MISC Check blood sugar twice a day and as directed. Dx E11.65 1 each 0   LANTUS SOLOSTAR 100 UNIT/ML Solostar Pen INJECT 45 UNITS IN THE SKIN DAILY AT 10PM. IF FASTING BLOOD SUGAR >120 X2 DAYS, INCREASE BY 2 UNITS 15 mL 11   latanoprost (XALATAN) 0.005 % ophthalmic solution Place 1 drop into both eyes at bedtime.      lisinopril (ZESTRIL) 40 MG tablet TAKE 1 TABLET BY MOUTH EVERY DAY 90 tablet 1   metFORMIN (GLUCOPHAGE-XR) 500 MG 24 hr tablet TAKE TWO (2) TABLETS BY MOUTH 2 TIMES DAILY 360 tablet 1   ONETOUCH VERIO test strip USE TO CHECK BLOOD SUGAR THREE TIMES DAILY. DX: E11.65 100 strip 11   pravastatin (PRAVACHOL) 40 MG tablet TAKE 1 TABLET BY MOUTH EVERY DAY 90 tablet 3   RESTASIS 0.05 % ophthalmic emulsion INSTILL 1 DROP INTO BOTH EYES TWICE A DAY INSTILL 1 DROP INTO BOTH EYES TWICE DAILY     sertraline (ZOLOFT) 25 MG tablet TAKE 1 TABLET BY MOUTH EVERY DAY 90 tablet 1   SYSTANE ULTRA 0.4-0.3 % SOLN SMARTSIG:1 Drop(s) In Eye(s) As Needed     valACYclovir (VALTREX) 500 MG tablet TAKE 1 TABLET BY MOUTH 2 TIMES DAILY, TAKE FOR 3 DAYS FOR ACUTE FLARE 60 tablet 6   nitroGLYCERIN (NITRODUR - DOSED IN MG/24 HR) 0.2 mg/hr patch Apply 1/4 patch to affected area as directed by MD and change every 24 hours. 30 patch 2   ondansetron (ZOFRAN) 4 MG tablet Take 1 tablet (4 mg total) by mouth every 8 (eight) hours as needed for nausea or vomiting. 20 tablet 0   No facility-administered medications prior to visit.     Per HPI unless specifically indicated in ROS section below Review of Systems  Constitutional:  Negative for fatigue and fever.  HENT:  Negative for ear pain.   Eyes:  Negative for pain.  Respiratory:  Negative for chest tightness and shortness of breath.   Cardiovascular:  Negative for chest pain, palpitations and leg swelling.  Gastrointestinal:  Negative for abdominal pain.  Genitourinary:  Negative for dysuria.  Objective:  BP  116/74   Pulse 94   Temp 98.3 F (36.8 C) (Temporal)   Ht '5\' 6"'  (1.676 m)   Wt 183 lb (83 kg)   LMP 02/02/2006   SpO2 98%   BMI 29.54 kg/m   Wt Readings from Last 3 Encounters:  08/08/20 183 lb (83 kg)  06/07/20 180 lb (81.6 kg)  11/03/19 182 lb (82.6 kg)      Physical Exam Constitutional:      General: She is not in acute distress.    Appearance: Normal appearance. She is well-developed. She is not ill-appearing or  toxic-appearing.  HENT:     Head: Normocephalic.     Right Ear: Hearing, tympanic membrane, ear canal and external ear normal. Tympanic membrane is not erythematous, retracted or bulging.     Left Ear: Hearing, tympanic membrane, ear canal and external ear normal. Tympanic membrane is not erythematous, retracted or bulging.     Nose: No mucosal edema or rhinorrhea.     Right Sinus: No maxillary sinus tenderness or frontal sinus tenderness.     Left Sinus: No maxillary sinus tenderness or frontal sinus tenderness.     Mouth/Throat:     Pharynx: Uvula midline.  Eyes:     General: Lids are normal. Lids are everted, no foreign bodies appreciated.     Conjunctiva/sclera: Conjunctivae normal.     Pupils: Pupils are equal, round, and reactive to light.  Neck:     Thyroid: No thyroid mass or thyromegaly.     Vascular: No carotid bruit.     Trachea: Trachea normal.  Cardiovascular:     Rate and Rhythm: Normal rate and regular rhythm.     Pulses: Normal pulses.     Heart sounds: Normal heart sounds, S1 normal and S2 normal. No murmur heard.   No friction rub. No gallop.  Pulmonary:     Effort: Pulmonary effort is normal. No tachypnea or respiratory distress.     Breath sounds: Normal breath sounds. No decreased breath sounds, wheezing, rhonchi or rales.  Abdominal:     General: Bowel sounds are normal.     Palpations: Abdomen is soft.     Tenderness: There is no abdominal tenderness.  Musculoskeletal:     Cervical back: Normal range of motion and neck supple.   Skin:    General: Skin is warm and dry.     Findings: No rash.  Neurological:     Mental Status: She is alert.  Psychiatric:        Mood and Affect: Mood is not anxious or depressed.        Speech: Speech normal.        Behavior: Behavior normal. Behavior is cooperative.        Thought Content: Thought content normal.        Judgment: Judgment normal.    Diabetic foot exam: Normal inspection No skin breakdown No calluses  Normal DP pulses Normal sensation to light touch and monofilament Nails normal     Results for orders placed or performed in visit on 08/01/20  Comprehensive metabolic panel  Result Value Ref Range   Sodium 138 135 - 145 mEq/L   Potassium 4.3 3.5 - 5.1 mEq/L   Chloride 105 96 - 112 mEq/L   CO2 27 19 - 32 mEq/L   Glucose, Bld 101 (H) 70 - 99 mg/dL   BUN 19 6 - 23 mg/dL   Creatinine, Ser 0.98 0.40 - 1.20 mg/dL   Total Bilirubin 0.4 0.2 - 1.2 mg/dL   Alkaline Phosphatase 66 39 - 117 U/L   AST 17 0 - 37 U/L   ALT 20 0 - 35 U/L   Total Protein 6.9 6.0 - 8.3 g/dL   Albumin 4.1 3.5 - 5.2 g/dL   GFR 59.95 (L) >60.00 mL/min   Calcium 9.8 8.4 - 10.5 mg/dL  Lipid panel  Result Value Ref Range   Cholesterol 143 0 - 200 mg/dL   Triglycerides 130.0 0.0 - 149.0 mg/dL   HDL 46.30 >39.00 mg/dL   VLDL 26.0 0.0 - 40.0 mg/dL   LDL Cholesterol  71 0 - 99 mg/dL   Total CHOL/HDL Ratio 3    NonHDL 96.73   Hemoglobin A1c  Result Value Ref Range   Hgb A1c MFr Bld 9.7 (H) 4.6 - 6.5 %    This visit occurred during the SARS-CoV-2 public health emergency.  Safety protocols were in place, including screening questions prior to the visit, additional usage of staff PPE, and extensive cleaning of exam room while observing appropriate contact time as indicated for disinfecting solutions.   COVID 19 screen:  No recent travel or known exposure to COVID19 The patient denies respiratory symptoms of COVID 19 at this time. The importance of social distancing was discussed today.    Assessment and Plan   The patient's preventative maintenance and recommended screening tests for an annual wellness exam were reviewed in full today. Brought up to date unless services declined.  Counselled on the importance of diet, exercise, and its role in overall health and mortality. The patient's FH and SH was reviewed, including their home life, tobacco status, and drug and alcohol status.    Vaccines:  S/P COVID x 3 Pap/DVE:  GYN Mammo: GYN 06/2020 Bone Density: BALAN.Marland Kitchen on fosamax.. encouraged pt to return Colon: 2009 repeat in 10 years. Cologuard 12/2019  Smoking Status: none ETOH/ drug SLH:TDSK/AJGO  Hep C: done  HIV screen:  refused  Problem List Items Addressed This Visit     Hyperlipidemia associated with type 2 diabetes mellitus ([place])    Stable, chronic.  Continue current medication.   pravastatin 40 mg daily      Relevant Medications   lisinopril (ZESTRIL) 40 MG tablet   metFORMIN (GLUCOPHAGE-XR) 500 MG 24 hr tablet   Hypertension associated with diabetes ([place]) - Primary    Stable, chronic.  Continue current medication.         Relevant Medications   lisinopril (ZESTRIL) 40 MG tablet   metFORMIN (GLUCOPHAGE-XR) 500 MG 24 hr tablet   Uncontrolled type 2 diabetes mellitus with hyperglycemia ([facility])    Take Lantus in AM instead at night.  Use 47 units daily.  Continue metformin at current dose.  We will set up continuous glucose monitor. Start fasting acting insulin per sliding scale at largest meal of the day.       Relevant Medications   lisinopril (ZESTRIL) 40 MG tablet   Insulin Pen Needle (B-D ULTRAFINE III SHORT PEN) 31G X 8 MM MISC   metFORMIN (GLUCOPHAGE-XR) 500 MG 24 hr tablet   Other Visit Diagnoses     Essential hypertension       Relevant Medications   lisinopril (ZESTRIL) 40 MG tablet       Eliezer Lofts, MD

## 2020-08-08 NOTE — Patient Instructions (Signed)
Take Lantus in AM instead at night.  Use 47 units daily.  Continue metformin at current dose.  We will set up continuous glucose monitor. Start fasting acting insulin per sliding scale at largest meal of the day.

## 2020-08-08 NOTE — Telephone Encounter (Signed)
Sent SSI scale

## 2020-08-14 NOTE — Telephone Encounter (Signed)
Let pt know regular insulin = rapid acting insulin . Scale reviewed and is correct.

## 2020-08-26 ENCOUNTER — Telehealth: Payer: Self-pay

## 2020-08-26 DIAGNOSIS — H401131 Primary open-angle glaucoma, bilateral, mild stage: Secondary | ICD-10-CM | POA: Diagnosis not present

## 2020-08-26 NOTE — Telephone Encounter (Signed)
Teira notified as instructed by telephone.  Patient states understanding.  Will await further input from Dr. Diona Browner.

## 2020-08-26 NOTE — Telephone Encounter (Signed)
Can you call  Stop fast-acting insulin / sliding scale until Dr. B input.  It sounds like her insulin dosing is causing hypoglycemia.

## 2020-08-26 NOTE — Telephone Encounter (Signed)
Spoke to patient by telephone and was advised that her insulin was adjusted the last time she was seen by Dr. Diona Browner. Patient stated that she has started wearing the patch on her arm that checks her blood sugar. Patient stated in the morning she is taking long acting insulin and in the evening taking short acting. Patient stated the other night she had dinner and her blood sugar was 140, then at 10:00  it was 80 and at 1:00 am it had dropped to 40. Patient stated that she took 3 glucose tablets and then took 3 more. Patient stated that finally around 4:00 am she finally got her blood sugar up by eating some honey and something else that she can not remember what it was. Patient stated that she overloaded on carbs last night so that her blood sugar would not drop. Patient had lots of notes on her phone and will send all of the information to Dr. Diona Browner thru [facility]. Advised patient that this message and her mychart message will be sent to Dr. Diona Browner for her to review.

## 2020-08-26 NOTE — Telephone Encounter (Signed)
Pt LVM reporting she has had a night hypoglycemic BG of 43 and wants to discuss with PCP.  Can be reached at [phone]

## 2020-08-27 NOTE — Telephone Encounter (Signed)
Will respond via MyChart.

## 2020-09-19 ENCOUNTER — Encounter: Payer: Self-pay | Admitting: Family Medicine

## 2020-09-19 ENCOUNTER — Other Ambulatory Visit: Payer: Self-pay

## 2020-09-19 ENCOUNTER — Ambulatory Visit (INDEPENDENT_AMBULATORY_CARE_PROVIDER_SITE_OTHER): Payer: HMO | Admitting: Family Medicine

## 2020-09-19 VITALS — BP 118/70 | HR 84 | Temp 97.8°F | Ht 66.0 in | Wt 181.2 lb

## 2020-09-19 DIAGNOSIS — M8580 Other specified disorders of bone density and structure, unspecified site: Secondary | ICD-10-CM | POA: Insufficient documentation

## 2020-09-19 DIAGNOSIS — E1165 Type 2 diabetes mellitus with hyperglycemia: Secondary | ICD-10-CM

## 2020-09-19 DIAGNOSIS — E213 Hyperparathyroidism, unspecified: Secondary | ICD-10-CM | POA: Insufficient documentation

## 2020-09-19 NOTE — Progress Notes (Signed)
Patient ID: Jean Robinson, female    DOB: May 29, 1955, 65 y.o.   MRN: [id number]  This visit was conducted in person.  BP 118/70   Pulse 84   Temp 97.8 F (36.6 C) (Temporal)   Ht _0  (1.676 m)   Wt 181 lb 4 oz (82.2 kg)   LMP 02/02/2006   SpO2 98%   BMI 29.25 kg/m    CC:  Chief Complaint  Patient presents with   Diabetes    Subjective:   HPI: Jean Robinson is a 65 y.o. female presenting on 09/19/2020 for Diabetes  Diabetes:   At last OV 08/08/2020: recommended Lantus in AM instead at night.  Use 47 units daily.  Continue metformin at current dose.  continuous glucose monitor. Start fasting acting insulin per sliding scale at largest meal of the day.  She reported on 7/25.. hypoglycemia so SSI was decreased.   Has some issues with CBG monitor staying attached... in water and during sleep.  Lantus in AM 47 units  Metformin 2 tabs po BID Using medications without difficulties:see above Hypoglycemic episodes: none since lower SSi Hyperglycemic episodes: occ > 240 Feet problems: none Blood Sugars averaging:  On continuous blood glucose monitor now: CBG average :  170 67% of time at target FBS 110- 120    BP Readings from Last 3 Encounters:  09/19/20 118/70  08/08/20 116/74  06/24/20 (!) 163/87   Lab Results  Component Value Date   HGBA1C 9.7 (H) 08/01/2020         Relevant past medical, surgical, family and social history reviewed and updated as indicated. Interim medical history since our last visit reviewed. Allergies and medications reviewed and updated. Outpatient Medications Prior to Visit  Medication Sig Dispense Refill   alendronate (FOSAMAX) 70 MG tablet TAKE 1 TABLET (70 MG TOTAL) BY MOUTH ONCE A WEEK. 12 tablet 3   aspirin 81 MG tablet Take 81 mg by mouth daily.     Blood Glucose Monitoring Suppl (ONETOUCH VERIO REFLECT) w/Device KIT Check blood sugar twice a day and as directed. Dx E11.65 1 kit 0   cetirizine (ZYRTEC) 10 MG tablet Take 1 tablet  (10 mg total) by mouth daily. 30 tablet 11   Continuous Blood Gluc Receiver (FREESTYLE LIBRE 14 DAY READER) DEVI Check blood sugar continuously E11.59 1 each 0   Continuous Blood Gluc Sensor (FREESTYLE LIBRE 14 DAY SENSOR) MISC Check blood sugar continuously E11.59 2 each 11   fluticasone (FLONASE) 50 MCG/ACT nasal spray Place 2 sprays into both nostrils daily. 16 g 6   ibuprofen (ADVIL) 600 MG tablet Take 1 tablet (600 mg total) by mouth every 6 (six) hours as needed. 30 tablet 0   Insulin Aspart FlexPen 100 UNIT/ML SOPN Using sliding scale to determine mealtime dose SQ injection, max dose per ay 20 units 3 mL 11   Insulin Pen Needle (B-D ULTRAFINE III SHORT PEN) 31G X 8 MM MISC E11.59 100 each 3   Lancet Devices (ONE TOUCH DELICA LANCING DEV) MISC Check blood sugar twice a day and as directed. Dx E11.65 1 each 0   LANTUS SOLOSTAR 100 UNIT/ML Solostar Pen INJECT 45 UNITS IN THE SKIN DAILY AT 10PM. IF FASTING BLOOD SUGAR >120 X2 DAYS, INCREASE BY 2 UNITS 15 mL 11   latanoprost (XALATAN) 0.005 % ophthalmic solution Place 1 drop into both eyes at bedtime.      lisinopril (ZESTRIL) 40 MG tablet Take 1 tablet (40 mg total) by mouth  daily. 90 tablet 3   metFORMIN (GLUCOPHAGE-XR) 500 MG 24 hr tablet 2 tabs po BID 360 tablet 3   ONETOUCH VERIO test strip USE TO CHECK BLOOD SUGAR THREE TIMES DAILY. DX: E11.65 100 strip 11   pravastatin (PRAVACHOL) 40 MG tablet TAKE 1 TABLET BY MOUTH EVERY DAY 90 tablet 3   RESTASIS 0.05 % ophthalmic emulsion INSTILL 1 DROP INTO BOTH EYES TWICE A DAY INSTILL 1 DROP INTO BOTH EYES TWICE DAILY     sertraline (ZOLOFT) 25 MG tablet TAKE 1 TABLET BY MOUTH EVERY DAY 90 tablet 1   SYSTANE ULTRA 0.4-0.3 % SOLN SMARTSIG:1 Drop(s) In Eye(s) As Needed     valACYclovir (VALTREX) 500 MG tablet TAKE 1 TABLET BY MOUTH 2 TIMES DAILY, TAKE FOR 3 DAYS FOR ACUTE FLARE 60 tablet 6   No facility-administered medications prior to visit.     Per HPI unless specifically indicated in ROS  section below Review of Systems  Constitutional:  Negative for fatigue and fever.  HENT:  Negative for ear pain.   Eyes:  Negative for pain.  Respiratory:  Negative for chest tightness and shortness of breath.   Cardiovascular:  Negative for chest pain, palpitations and leg swelling.  Gastrointestinal:  Negative for abdominal pain.  Genitourinary:  Negative for dysuria.  Objective:  BP 118/70   Pulse 84   Temp 97.8 F (36.6 C) (Temporal)   Ht _0  (1.676 m)   Wt 181 lb 4 oz (82.2 kg)   LMP 02/02/2006   SpO2 98%   BMI 29.25 kg/m   Wt Readings from Last 3 Encounters:  09/19/20 181 lb 4 oz (82.2 kg)  08/08/20 183 lb (83 kg)  06/07/20 180 lb (81.6 kg)      Physical Exam Constitutional:      General: She is not in acute distress.    Appearance: Normal appearance. She is well-developed. She is not ill-appearing or toxic-appearing.  HENT:     Head: Normocephalic.     Right Ear: Hearing, tympanic membrane, ear canal and external ear normal. Tympanic membrane is not erythematous, retracted or bulging.     Left Ear: Hearing, tympanic membrane, ear canal and external ear normal. Tympanic membrane is not erythematous, retracted or bulging.     Nose: No mucosal edema or rhinorrhea.     Right Sinus: No maxillary sinus tenderness or frontal sinus tenderness.     Left Sinus: No maxillary sinus tenderness or frontal sinus tenderness.     Mouth/Throat:     Pharynx: Uvula midline.  Eyes:     General: Lids are normal. Lids are everted, no foreign bodies appreciated.     Conjunctiva/sclera: Conjunctivae normal.     Pupils: Pupils are equal, round, and reactive to light.  Neck:     Thyroid: No thyroid mass or thyromegaly.     Vascular: No carotid bruit.     Trachea: Trachea normal.  Cardiovascular:     Rate and Rhythm: Normal rate and regular rhythm.     Pulses: Normal pulses.     Heart sounds: Normal heart sounds, S1 normal and S2 normal. No murmur heard.   No friction rub. No gallop.   Pulmonary:     Effort: Pulmonary effort is normal. No tachypnea or respiratory distress.     Breath sounds: Normal breath sounds. No decreased breath sounds, wheezing, rhonchi or rales.  Abdominal:     General: Bowel sounds are normal.     Palpations: Abdomen is soft.  Tenderness: There is no abdominal tenderness.  Musculoskeletal:     Cervical back: Normal range of motion and neck supple.  Skin:    General: Skin is warm and dry.     Findings: No rash.  Neurological:     Mental Status: She is alert.  Psychiatric:        Mood and Affect: Mood is not anxious or depressed.        Speech: Speech normal.        Behavior: Behavior normal. Behavior is cooperative.        Thought Content: Thought content normal.        Judgment: Judgment normal.      Results for orders placed or performed in visit on 08/08/20  HM DIABETES FOOT EXAM  Result Value Ref Range   HM Diabetic Foot Exam done     This visit occurred during the SARS-CoV-2 public health emergency.  Safety protocols were in place, including screening questions prior to the visit, additional usage of staff PPE, and extensive cleaning of exam room while observing appropriate contact time as indicated for disinfecting solutions.   COVID 19 screen:  No recent travel or known exposure to COVID19 The patient denies respiratory symptoms of COVID 19 at this time. The importance of social distancing was discussed today.   Assessment and Plan    Problem List Items Addressed This Visit     Uncontrolled type 2 diabetes mellitus with hyperglycemia ([place]) - Primary     Improving control but having worry about nighttime lows so eating/waking in night  Have bedtime snack .. protein and fiber.  Continue  SSI insulin at dinner but can add SSI at  lunch as well... on lower strength scale Decrease Lantus to 45 units.  Consider nutrition referral for carb counting.         Eliezer Lofts, MD

## 2020-09-19 NOTE — Patient Instructions (Addendum)
Continue  SSI insulin at dinner but can add SSI at  lunch as well. Decrease Lantus to 45 units. Have bedtime snack .. protein and fiber. Consider nutrition referral for carb counting.

## 2020-09-19 NOTE — Assessment & Plan Note (Signed)
Improving control but having worry about nighttime lows so eating/waking in night  Have bedtime snack .. protein and fiber.  Continue  SSI insulin at dinner but can add SSI at  lunch as well... on lower strength scale Decrease Lantus to 45 units.  Consider nutrition referral for carb counting.

## 2020-10-05 ENCOUNTER — Other Ambulatory Visit: Payer: Self-pay | Admitting: Family Medicine

## 2020-10-13 NOTE — Assessment & Plan Note (Signed)
Stable, chronic.  Continue current medication.    

## 2020-10-13 NOTE — Assessment & Plan Note (Signed)
Take Lantus in AM instead at night.  Use 47 units daily.  Continue metformin at current dose.  We will set up continuous glucose monitor. Start fasting acting insulin per sliding scale at largest meal of the day.

## 2020-10-13 NOTE — Assessment & Plan Note (Signed)
Stable, chronic.  Continue current medication.   pravastatin 40 mg daily

## 2020-10-29 ENCOUNTER — Other Ambulatory Visit: Payer: Self-pay | Admitting: Family Medicine

## 2020-10-29 DIAGNOSIS — E1165 Type 2 diabetes mellitus with hyperglycemia: Secondary | ICD-10-CM

## 2020-11-22 ENCOUNTER — Encounter: Payer: HMO | Attending: Family Medicine | Admitting: *Deleted

## 2020-11-22 ENCOUNTER — Other Ambulatory Visit: Payer: Self-pay

## 2020-11-22 ENCOUNTER — Encounter: Payer: Self-pay | Admitting: *Deleted

## 2020-11-22 VITALS — BP 114/80 | Ht 66.0 in | Wt 182.4 lb

## 2020-11-22 DIAGNOSIS — E1165 Type 2 diabetes mellitus with hyperglycemia: Secondary | ICD-10-CM | POA: Insufficient documentation

## 2020-11-22 DIAGNOSIS — E162 Hypoglycemia, unspecified: Secondary | ICD-10-CM

## 2020-11-22 NOTE — Progress Notes (Signed)
Diabetes Self-Management Education  Visit Type: First/Initial  Appt. Start Time: 1330 Appt. End Time: 1500  11/22/2020  [name], identified by name and date of birth, is a 65 y.o. female with a diagnosis of Diabetes: Type 2.   ASSESSMENT  Blood pressure 114/80, height 5\' 6"  (1.676 m), weight 182 lb 6.4 oz (82.7 kg), last menstrual period 02/02/2006. Body mass index is 29.44 kg/m.   Diabetes Self-Management Education - 11/22/20 1633       Visit Information   Visit Type First/Initial      Initial Visit   Diabetes Type Type 2    Are you currently following a meal plan? No    Are you taking your medications as prescribed? Yes    Date Diagnosed 28 years      Health Coping   How would you rate your overall health? Good      Psychosocial Assessment   Patient Belief/Attitude about Diabetes Defeat/Burnout    Self-care barriers None    Self-management support Doctor's office;Family    Patient Concerns Nutrition/Meal planning;Weight Control;Glycemic Control;Medication;Monitoring;Healthy Lifestyle    Special Needs None    Preferred Learning Style Visual;Other (comment)   talking/discussion   Learning Readiness Change in progress    How often do you need to have someone help you when you read instructions, pamphlets, or other written materials from your doctor or pharmacy? 1 - Never    What is the last grade level you completed in school? 3 years college      Pre-Education Assessment   Patient understands the diabetes disease and treatment process. Needs Review    Patient understands incorporating nutritional management into lifestyle. Needs Review    Patient undertands incorporating physical activity into lifestyle. Needs Review    Patient understands using medications safely. Needs Review    Patient understands monitoring blood glucose, interpreting and using results Needs Review    Patient understands prevention, detection, and treatment of acute complications. Needs Review     Patient understands prevention, detection, and treatment of chronic complications. Needs Review    Patient understands how to develop strategies to address psychosocial issues. Needs Review    Patient understands how to develop strategies to promote health/change behavior. Needs Review      Complications   Last HgB A1C per patient/outside source 9.7 %   08/01/2020   How often do you check your blood sugar? > 4 times/day   She has a FreeStyle Libre 1. Average BG 189 mg/dL for 30 days. TIR = 54%; 181-240 = 22%; >240 = 24%. No low BG's in last 30 days.   Fasting Blood glucose range (mg/dL) 70-129;130-179   FBG's 104-165 mg/dL.   Number of hypoglycemic episodes per month 2   Pt had 2 readings in last month but Libre had 0% in averages.   Can you tell when your blood sugar is low? Yes    What do you do if your blood sugar is low? glucose tablets, juice    Have you had a dilated eye exam in the past 12 months? Yes    Have you had a dental exam in the past 12 months? No    Are you checking your feet? No      Dietary Intake   Breakfast special K with blueberries; cottage cheese and fruit; [place] yogurt; eggs rarely    Snack (morning) 2-3 snacks - hard boiled eggs, crackers and cheese, Kind bars, pretzels, avocado    Lunch yogurt, peanut butter and jelly  sandwich, chick-fil-a sandwich, chicken nuggets, sausage biscuit    Snack (afternoon) same as am    Dinner chicken, beef, pork; soups, potatoes 3 x week, rice, broccoli, carrots, onions, peppers, cauliflower, salads with peppers, mushrooms, tomatoes, onions    Snack (evening) same as am    Beverage(s) water, 1/2 and 1/2 tea      Exercise   Exercise Type Light (walking / raking leaves)    How many days per week to you exercise? 2    How many minutes per day do you exercise? 25    Total minutes per week of exercise 50      Patient Education   Previous Diabetes Education Yes (please comment)    Disease state  Factors that contribute to the  development of diabetes;Explored patient's options for treatment of their diabetes    Nutrition management  Role of diet in the treatment of diabetes and the relationship between the three main macronutrients and blood glucose level;Food label reading, portion sizes and measuring food.;Carbohydrate counting;Reviewed blood glucose goals for pre and post meals and how to evaluate the patients' food intake on their blood glucose level.;Meal timing in regards to the patients' current diabetes medication.   Discussed ICR   Physical activity and exercise  Role of exercise on diabetes management, blood pressure control and cardiac health.    Medications Taught/reviewed insulin injection, site rotation, insulin storage and needle disposal.;Reviewed patients medication for diabetes, action, purpose, timing of dose and side effects.    Monitoring Purpose and frequency of SMBG.;Taught/discussed recording of test results and interpretation of SMBG.;Identified appropriate SMBG and/or A1C goals.;Daily foot exams;Other (comment)   CGM interpretation   Acute complications Taught treatment of hypoglycemia - the 15 rule.   Pt fearful of hypoglycemia during the night. Discussed keeping fast acting glucose and a snack at her bedside.   Chronic complications Relationship between chronic complications and blood glucose control;Assessed and discussed foot care and prevention of foot problems    Psychosocial adjustment Role of stress on diabetes;Identified and addressed patients feelings and concerns about diabetes      Individualized Goals (developed by patient)   Reducing Risk Other (comment)   improve blood sugars, decrease medications, prevent diabetes complications, lose weight, lead a healthier lifestyle     Outcomes   Expected Outcomes Demonstrated interest in learning. Expect positive outcomes    Future DMSE 2 wks        Individualized Plan for Diabetes Self-Management Training:   Learning Objective:  Patient  will have a greater understanding of diabetes self-management. Patient education plan is to attend individual and/or group sessions per assessed needs and concerns.   Plan:   Patient Instructions  Check blood sugars before each meal, 2 hrs after each meal and before bed every day (or as needed with Jean Robinson) Bring blood sugar records to the next appointment  Exercise: Continue to walk for    20-30  minutes   2  days a week and gradually increase to 30 minutes 5 x week  Eat 3 meals day,   2-3  snacks a day Space meals 4-6 hours apart Avoid sugar sweetened drinks (tea)   Complete 3 Day Food Record and bring to next appt  Carry fast acting glucose and a snack at all times Rotate injection sites  Return for appointment on:   Monday December 09, 2020 at 8:45 am with Jean Robinson (nurse)  Expected Outcomes:  Demonstrated interest in learning. Expect positive outcomes  Education material provided:  General  Meal Planning Guidelines Simple Meal Plan 3 Day Food Record Symptoms, causes and treatments of Hypoglycemia  If problems or questions, patient to contact team via:   Jean Drilling, RN, [place], [place] [phone]  Future DSME appointment: 2 wks December 09, 2020 with this nurse

## 2020-11-22 NOTE — Patient Instructions (Addendum)
Check blood sugars before each meal, 2 hrs after each meal and before bed every day (or as needed with Elenor Legato) Bring blood sugar records to the next appointment  Exercise: Continue to walk for    20-30  minutes   2  days a week and gradually increase to 30 minutes 5 x week  Eat 3 meals day,   2-3  snacks a day Space meals 4-6 hours apart Avoid sugar sweetened drinks (tea)   Complete 3 Day Food Record and bring to next appt  Carry fast acting glucose and a snack at all times Rotate injection sites  Return for appointment on:   Monday December 09, 2020 at 8:45 am with Freda Munro (nurse)

## 2020-11-24 MED ORDER — FREESTYLE LIBRE 14 DAY READER DEVI
0 refills | Status: DC
Start: 2020-11-24 — End: 2020-11-25

## 2020-11-24 MED ORDER — FREESTYLE LIBRE 14 DAY SENSOR MISC
11 refills | Status: DC
Start: 2020-11-24 — End: 2020-11-25

## 2020-11-25 ENCOUNTER — Other Ambulatory Visit: Payer: Self-pay | Admitting: Family Medicine

## 2020-11-25 DIAGNOSIS — A6009 Herpesviral infection of other urogenital tract: Secondary | ICD-10-CM

## 2020-11-25 MED ORDER — FREESTYLE LIBRE 2 READER DEVI: 0 refills | Status: AC

## 2020-11-25 MED ORDER — FREESTYLE LIBRE 2 SENSOR MISC: 11 refills | Status: DC

## 2020-11-25 NOTE — Telephone Encounter (Signed)
Last office visit 09/19/2020 for DM.  Last refilled 10/21/2018 for #60 with 6 refills.  No future appointments with PCP.

## 2020-11-25 NOTE — Addendum Note (Signed)
Addended by: Carter Kitten on: 11/25/2020 11:48 AM   Modules accepted: Orders

## 2020-12-09 ENCOUNTER — Ambulatory Visit: Payer: HMO | Admitting: *Deleted

## 2020-12-16 ENCOUNTER — Encounter: Payer: HMO | Attending: Family Medicine | Admitting: *Deleted

## 2020-12-16 ENCOUNTER — Encounter: Payer: Self-pay | Admitting: *Deleted

## 2020-12-16 ENCOUNTER — Other Ambulatory Visit: Payer: Self-pay

## 2020-12-16 VITALS — BP 134/80 | Wt 184.4 lb

## 2020-12-16 DIAGNOSIS — E1165 Type 2 diabetes mellitus with hyperglycemia: Secondary | ICD-10-CM | POA: Diagnosis not present

## 2020-12-16 NOTE — Patient Instructions (Signed)
Check blood sugars before each meal, 2 hours after each meal and before bed every day or as needed with FreeStyle Libre x day  Bring phone to your MD appointments to show CGM results  Continue walking for    20-30  minutes   2 x week and gradually increase to 30 minutes 5 x week    Eat 3 meals day,   2-3  snacks a day Space meals 4-6 hours apart Continue to avoid to sugar sweetened drinks unless treating a low blood sugar Continue to limit sweets  Continue to carry fast acting glucose and a snack at all times Rotate injection sites

## 2020-12-16 NOTE — Progress Notes (Signed)
Diabetes Self-Management Education  Visit Type: Follow-up  Appt. Start Time: 1330 Appt. End Time: 1435  12/16/2020  [name], identified by name and date of birth, is a 65 y.o. female with a diagnosis of Diabetes: Type 2 .   ASSESSMENT  Blood pressure 134/80, weight 184 lb 6.4 oz (83.6 kg), last menstrual period 02/02/2006. Body mass index is 29.76 kg/m.   Diabetes Self-Management Education - 12/16/20 1453       Visit Information   Visit Type Follow-up      Complications   How often do you check your blood sugar? > 4 times/day   Pt has new FreeStyle Libre 2 x 2 weeks. BG in the office today was 175 mg/dL.14 day average = 162 mg/dL; TIR = 69%; 181-240 = 24%; >240 = 7%. No low BG's in last 14 days   Fasting Blood glucose range (mg/dL) 70-129   2 reported FBG's of 88 and 104 mg/dL   Number of hypoglycemic episodes per month 0    Can you tell when your blood sugar is low? Yes    What do you do if your blood sugar is low? has hard candy or drinks juice or eats raisins    Have you had a dilated eye exam in the past 12 months? Yes    Have you had a dental exam in the past 12 months? No    Are you checking your feet? No      Dietary Intake   Breakfast 3 meals and 2-3 snacks/day - see food records      Exercise   Exercise Type Light (walking / raking leaves)    How many days per week to you exercise? 2    How many minutes per day do you exercise? 25    Total minutes per week of exercise 50      Patient Education   Disease state  Factors that contribute to the development of diabetes;Explored patient's options for treatment of their diabetes    Nutrition management  Role of diet in the treatment of diabetes and the relationship between the three main macronutrients and blood glucose level;Food label reading, portion sizes and measuring food.;Carbohydrate counting;Reviewed blood glucose goals for pre and post meals and how to evaluate the patients' food intake on their blood  glucose level.;Meal timing in regards to the patients' current diabetes medication.;Information on hints to eating out and maintain blood glucose control.    Physical activity and exercise  Role of exercise on diabetes management, blood pressure control and cardiac health.;Helped patient identify appropriate exercises in relation to his/her diabetes, diabetes complications and other health issue.   discussed chair exercises   Medications Reviewed patients medication for diabetes, action, purpose, timing of dose and side effects.    Monitoring Purpose and frequency of SMBG.;Taught/discussed recording of test results and interpretation of SMBG.;Identified appropriate SMBG and/or A1C goals.;Daily foot exams;Other (comment)   GCM interpretation; keeping phone within 20 feet to prevent loss of data   Acute complications Taught treatment of hypoglycemia - the 15 rule.   Pt reports feeling better and sleeping more since she has the alarms to wake her if she has a low.   Chronic complications Relationship between chronic complications and blood glucose control    Psychosocial adjustment Identified and addressed patients feelings and concerns about diabetes      Individualized Goals (developed by patient)   Nutrition Follow meal plan discussed    Physical Activity Exercise 3-5 times per week;30  minutes per day    Monitoring  test blood glucose pre and post meals as discussed    Reducing Risk examine blood glucose patterns;treat hypoglycemia with 15 grams of carbs if blood glucose less than 70mg /dL      Post-Education Assessment   Patient understands the diabetes disease and treatment process. Demonstrates understanding / competency    Patient understands incorporating nutritional management into lifestyle. Demonstrates understanding / competency    Patient undertands incorporating physical activity into lifestyle. Needs Review    Patient understands using medications safely. Demonstrates understanding /  competency    Patient understands monitoring blood glucose, interpreting and using results Demonstrates understanding / competency    Patient understands prevention, detection, and treatment of acute complications. Demonstrates understanding / competency    Patient understands prevention, detection, and treatment of chronic complications. Demonstrates understanding / competency    Patient understands how to develop strategies to address psychosocial issues. Demonstrates understanding / competency    Patient understands how to develop strategies to promote health/change behavior. Demonstrates understanding / competency      Outcomes   Expected Outcomes Demonstrated interest in learning. Expect positive outcomes    Program Status Completed      Subsequent Visit   Since your last visit have you continued or begun to take your medications as prescribed? Yes    Since your last visit have you had your blood pressure checked? No    Since your last visit have you experienced any weight changes? Gain    Weight Gain (lbs) 2    Since your last visit, are you checking your blood glucose at least once a day? Yes        Individualized Plan for Diabetes Self-Management Training:   Learning Objective:  Patient will have a greater understanding of diabetes self-management. Patient education plan is to attend individual and/or group sessions per assessed needs and concerns.   Plan:   Patient Instructions  Check blood sugars before each meal, 2 hours after each meal and before bed every day or as needed with FreeStyle Libre  Bring phone to your MD appointments to show CGM results  Continue walking for    20-30  minutes   2 x week and gradually increase to 30 minutes 5 x week    Eat 3 meals day,   2-3  snacks a day Space meals 4-6 hours apart Continue to avoid to sugar sweetened drinks unless treating a low blood sugar Continue to limit sweets  Continue to carry fast acting glucose and a snack at  all times Rotate injection sites   Expected Outcomes:  Demonstrated interest in learning. Expect positive outcomes  Education material provided:  Planning a Balanced Meal Exercise Guide (ADA)  If problems or questions, patient to contact team via:   Johny Drilling, RN, [place], [place] [phone]  Future DSME appointment:  PRN

## 2020-12-19 ENCOUNTER — Other Ambulatory Visit: Payer: Self-pay | Admitting: Family Medicine

## 2020-12-31 ENCOUNTER — Encounter: Payer: Self-pay | Admitting: Family Medicine

## 2020-12-31 ENCOUNTER — Ambulatory Visit (INDEPENDENT_AMBULATORY_CARE_PROVIDER_SITE_OTHER): Payer: HMO | Admitting: Family Medicine

## 2020-12-31 ENCOUNTER — Other Ambulatory Visit: Payer: Self-pay

## 2020-12-31 VITALS — HR 81 | Temp 97.4°F | Ht 66.0 in | Wt 183.0 lb

## 2020-12-31 DIAGNOSIS — L0231 Cutaneous abscess of buttock: Secondary | ICD-10-CM

## 2020-12-31 DIAGNOSIS — L03317 Cellulitis of buttock: Secondary | ICD-10-CM | POA: Diagnosis not present

## 2020-12-31 DIAGNOSIS — E1165 Type 2 diabetes mellitus with hyperglycemia: Secondary | ICD-10-CM

## 2020-12-31 LAB — COMPREHENSIVE METABOLIC PANEL
ALT: 16 U/L (ref 0–35)
AST: 16 U/L (ref 0–37)
Albumin: 4.2 g/dL (ref 3.5–5.2)
Alkaline Phosphatase: 63 U/L (ref 39–117)
BUN: 19 mg/dL (ref 6–23)
CO2: 28 mEq/L (ref 19–32)
Calcium: 11.2 mg/dL — ABNORMAL HIGH (ref 8.4–10.5)
Chloride: 103 mEq/L (ref 96–112)
Creatinine, Ser: 1.01 mg/dL (ref 0.40–1.20)
GFR: 57.65 mL/min — ABNORMAL LOW (ref >60.00)
Glucose, Bld: 142 mg/dL — ABNORMAL HIGH (ref 70–99)
Potassium: 4.4 mEq/L (ref 3.5–5.1)
Sodium: 136 mEq/L (ref 135–145)
Total Bilirubin: 0.4 mg/dL (ref 0.2–1.2)
Total Protein: 7.1 g/dL (ref 6.0–8.3)

## 2020-12-31 LAB — LIPID PANEL
Cholesterol: 155 mg/dL (ref 0–200)
HDL: 50.3 mg/dL (ref >39.00)
LDL Cholesterol: 84 mg/dL (ref 0–99)
NonHDL: 104.24
Total CHOL/HDL Ratio: 3
Triglycerides: 103 mg/dL (ref 0.0–149.0)
VLDL: 20.6 mg/dL (ref 0.0–40.0)

## 2020-12-31 LAB — HEMOGLOBIN A1C: Hgb A1c MFr Bld: 8.3 % — ABNORMAL HIGH (ref 4.6–6.5)

## 2020-12-31 MED ORDER — DOXYCYCLINE HYCLATE 100 MG PO TABS: 100.0000 mg | ORAL_TABLET | Freq: Two times a day (BID) | ORAL | 0 refills | Status: DC

## 2020-12-31 NOTE — Assessment & Plan Note (Signed)
Due for re-eval of diabetes.. has had improved number on current regimen. No more lows after working with dietician.

## 2020-12-31 NOTE — Progress Notes (Signed)
Patient ID: Jean Robinson, female    DOB: [birth year], 65 y.o.   MRN: [id number]  This visit was conducted in person.  Pulse 81   Temp (!) 97.4 F (36.3 C) (Temporal)   Ht '5\' 6"'  (1.676 m)   Wt 183 lb (83 kg)   LMP 02/02/2006   SpO2 97%   BMI 29.54 kg/m    CC:  Chief Complaint  Patient presents with   Sore on Buttocks    Subjective:   HPI: Jean Robinson is a 65 y.o. female with diabetes presenting on 12/31/2020 for Sore on Buttocks   She reports she first noted a lesion on right buttock 1 month ago.  Are was sore to sit on the area. Increased in size 2 inches, treated with warm compresses several times a day, neosporin.  Decreased in size to 1 inch, no discharge.. but is still remains.  No longer painful.  There is a nodule remaining that is firm.   No fever.   No change in blood sugars.. she is seeing the  nutritionist.  FBS 114, 2 hour < 200   75% in range for last 3 weeks on continuous glucose monitor.  No Lows in last several months.   Son with MRSA 15 years ago when playing sports     Relevant past medical, surgical, family and social history reviewed and updated as indicated. Interim medical history since our last visit reviewed. Allergies and medications reviewed and updated. Outpatient Medications Prior to Visit  Medication Sig Dispense Refill   alendronate (FOSAMAX) 70 MG tablet TAKE 1 TABLET (70 MG TOTAL) BY MOUTH ONCE A WEEK. 12 tablet 3   Blood Glucose Monitoring Suppl (ONETOUCH VERIO REFLECT) w/Device KIT Check blood sugar twice a day and as directed. Dx E11.65 1 kit 0   cetirizine (ZYRTEC) 10 MG tablet Take 1 tablet (10 mg total) by mouth daily. 30 tablet 11   Continuous Blood Gluc Receiver (FREESTYLE LIBRE 2 READER) DEVI Use to check blood sugar continuous due to low blood sugars at night.  Updated Rx 1 each 0   Continuous Blood Gluc Sensor (FREESTYLE LIBRE 2 SENSOR) MISC Use to check blood sugar continuous due to low blood sugars at night.  Updated Rx 2 each  11   fluticasone (FLONASE) 50 MCG/ACT nasal spray Place 2 sprays into both nostrils daily. 16 g 6   ibuprofen (ADVIL) 600 MG tablet Take 1 tablet (600 mg total) by mouth every 6 (six) hours as needed. 30 tablet 0   Insulin Aspart FlexPen 100 UNIT/ML SOPN Using sliding scale to determine mealtime dose SQ injection, max dose per ay 20 units 3 mL 11   insulin glargine (LANTUS SOLOSTAR) 100 UNIT/ML Solostar Pen INJECT 45 UNITS INTO THE SKIN AT 10 PM. IF FASTING BLOOD SUGAR >120 X2 DAYS, INCREASE BY 2 UNITS 15 mL 11   Insulin Pen Needle (B-D ULTRAFINE III SHORT PEN) 31G X 8 MM MISC E11.59 100 each 3   Lancet Devices (ONE TOUCH DELICA LANCING DEV) MISC Check blood sugar twice a day and as directed. Dx E11.65 1 each 0   latanoprost (XALATAN) 0.005 % ophthalmic solution Place 1 drop into both eyes at bedtime.      lisinopril (ZESTRIL) 40 MG tablet Take 1 tablet (40 mg total) by mouth daily. 90 tablet 3   metFORMIN (GLUCOPHAGE-XR) 500 MG 24 hr tablet 2 tabs po BID 360 tablet 3   ONETOUCH VERIO test strip USE TO CHECK BLOOD SUGAR  THREE TIMES DAILY. DX: E11.65 100 strip 11   pravastatin (PRAVACHOL) 40 MG tablet TAKE 1 TABLET BY MOUTH EVERY DAY 90 tablet 1   RESTASIS 0.05 % ophthalmic emulsion INSTILL 1 DROP INTO BOTH EYES TWICE A DAY INSTILL 1 DROP INTO BOTH EYES TWICE DAILY     sertraline (ZOLOFT) 25 MG tablet TAKE 1 TABLET BY MOUTH EVERY DAY 90 tablet 1   SYSTANE ULTRA 0.4-0.3 % SOLN SMARTSIG:1 Drop(s) In Eye(s) As Needed     valACYclovir (VALTREX) 500 MG tablet TAKE 1 TABLET BY MOUTH 2 TIMES DAILY, TAKE FOR 3 DAYS FOR ACUTE FLARE 180 tablet 2   aspirin 81 MG tablet Take 81 mg by mouth daily. (Patient not taking: Reported on 11/22/2020)     No facility-administered medications prior to visit.     Per HPI unless specifically indicated in ROS section below Review of Systems  Constitutional:  Negative for fatigue and fever.  HENT:  Negative for ear pain.   Eyes:  Negative for pain.  Respiratory:   Negative for chest tightness and shortness of breath.   Cardiovascular:  Negative for chest pain, palpitations and leg swelling.  Gastrointestinal:  Negative for abdominal pain.  Genitourinary:  Negative for dysuria.  Objective:  Pulse 81   Temp (!) 97.4 F (36.3 C) (Temporal)   Ht '5\' 6"'  (1.676 m)   Wt 183 lb (83 kg)   LMP 02/02/2006   SpO2 97%   BMI 29.54 kg/m   Wt Readings from Last 3 Encounters:  12/31/20 183 lb (83 kg)  12/16/20 184 lb 6.4 oz (83.6 kg)  11/22/20 182 lb 6.4 oz (82.7 kg)      Physical Exam Constitutional:      General: She is not in acute distress.    Appearance: Normal appearance. She is well-developed. She is not ill-appearing or toxic-appearing.  HENT:     Head: Normocephalic.     Right Ear: Hearing, tympanic membrane, ear canal and external ear normal. Tympanic membrane is not erythematous, retracted or bulging.     Left Ear: Hearing, tympanic membrane, ear canal and external ear normal. Tympanic membrane is not erythematous, retracted or bulging.     Nose: No mucosal edema or rhinorrhea.     Right Sinus: No maxillary sinus tenderness or frontal sinus tenderness.     Left Sinus: No maxillary sinus tenderness or frontal sinus tenderness.     Mouth/Throat:     Pharynx: Uvula midline.  Eyes:     General: Lids are normal. Lids are everted, no foreign bodies appreciated.     Conjunctiva/sclera: Conjunctivae normal.     Pupils: Pupils are equal, round, and reactive to light.  Neck:     Thyroid: No thyroid mass or thyromegaly.     Vascular: No carotid bruit.     Trachea: Trachea normal.  Cardiovascular:     Rate and Rhythm: Normal rate and regular rhythm.     Pulses: Normal pulses.     Heart sounds: Normal heart sounds, S1 normal and S2 normal. No murmur heard.   No friction rub. No gallop.  Pulmonary:     Effort: Pulmonary effort is normal. No tachypnea or respiratory distress.     Breath sounds: Normal breath sounds. No decreased breath sounds,  wheezing, rhonchi or rales.  Abdominal:     General: Bowel sounds are normal.     Palpations: Abdomen is soft.     Tenderness: There is no abdominal tenderness.  Musculoskeletal:     Cervical  back: Normal range of motion and neck supple.  Skin:    General: Skin is warm and dry.     Findings: Lesion present. No rash.     Comments: 1.5 cm x 1 cm lesion, slight erythema, no fluctuance but area of firm induration, nontender, no warmth on lower right buttock near gluteal crease.  Neurological:     Mental Status: She is alert.  Psychiatric:        Mood and Affect: Mood is not anxious or depressed.        Speech: Speech normal.        Behavior: Behavior normal. Behavior is cooperative.        Thought Content: Thought content normal.        Judgment: Judgment normal.      Results for orders placed or performed in visit on 08/08/20  HM DIABETES FOOT EXAM  Result Value Ref Range   HM Diabetic Foot Exam done     This visit occurred during the SARS-CoV-2 public health emergency.  Safety protocols were in place, including screening questions prior to the visit, additional usage of staff PPE, and extensive cleaning of exam room while observing appropriate contact time as indicated for disinfecting solutions.   COVID 19 screen:  No recent travel or known exposure to COVID19 The patient denies respiratory symptoms of COVID 19 at this time. The importance of social distancing was discussed today.   Assessment and Plan   Problem List Items Addressed This Visit     Cellulitis and abscess of buttock    Resolving abscess... remote exposure to MRSA, diabetic Will treat with continued warm compresses and oral antibiotics. No indication for I and D.      Uncontrolled type 2 diabetes mellitus with hyperglycemia ([place]) - Primary    Due for re-eval of diabetes.. has had improved number on current regimen. No more lows after working with dietician.      Relevant Orders   Hemoglobin A1c   Lipid  panel   Comprehensive metabolic panel    Eliezer Lofts, MD

## 2020-12-31 NOTE — Patient Instructions (Signed)
Continue warm compresses. Complete 7 days of antibiotics.  Call if worsening or redness spreading, fever etc.  Please stop at the lab to have labs drawn.

## 2020-12-31 NOTE — Assessment & Plan Note (Signed)
Resolving abscess... remote exposure to MRSA, diabetic Will treat with continued warm compresses and oral antibiotics. No indication for I and D.

## 2021-01-07 ENCOUNTER — Other Ambulatory Visit: Payer: Self-pay | Admitting: Family Medicine

## 2021-01-14 DIAGNOSIS — H401131 Primary open-angle glaucoma, bilateral, mild stage: Secondary | ICD-10-CM | POA: Diagnosis not present

## 2021-01-15 ENCOUNTER — Ambulatory Visit: Payer: HMO | Admitting: Obstetrics and Gynecology

## 2021-01-20 NOTE — Progress Notes (Signed)
65 y.o. [id number] Married Caucasian female here for breast and pelvic exam.    Patient is followed for pelvic organ prolapse.  Some urgency with diarrhea.  No leakage of urine with laughing.  Up once a night to void.   Drinks a large glass of water before bed.  No problems voiding.   No vaginal bleeding or spotting.   PCP:  Eliezer Lofts, MD    Patient's last menstrual period was 02/02/2006.           Sexually active: Yes.    The current method of family planning is post menopausal status.    Exercising: Yes.     Walking and very active Smoker:  no  Health Maintenance: Pap:  05-10-19 Neg:Neg HR HPV,  09-24-15 Neg:Neg HR HPV,02-09-12 Neg:Neg HR HPV History of abnormal Pap:  no MMG:  06-19-20 Neg/BiRads1 Colonoscopy:  12-19-19 Neg;next unsure BMD:   06-19-20  Result :Osteopenia of hips;spine normal TDaP:  09-13-18 Td Gardasil:   no EYC:XKGYJE Hep C:Unsure Screening Labs:  PCP Flu vaccine:  completed.  Covid bivalent booster:  completed.   reports that she has never smoked. She has never used smokeless tobacco. She reports current alcohol use. She reports that she does not use drugs.  Past Medical History:  Diagnosis Date   Achilles tendonitis    Allergy    Anxiety    Closed fracture of lateral portion of right tibial plateau 07/30/2017   COVID 2021   Depression    Diabetes mellitus    H/O renal calculi 10/08/2009   Hyperlipidemia    Hypertension    Serum calcium elevated    STD (sexually transmitted disease)    HSV I & II, IgG reflex positive   Tibial plateau fracture, right     Past Surgical History:  Procedure Laterality Date   APPENDECTOMY     CHOLECYSTECTOMY     ORIF TIBIA PLATEAU Right 07/30/2017   Procedure: OPEN REDUCTION INTERNAL FIXATION (ORIF) TIBIAL PLATEAU;  Surgeon: Marchia Bond, MD;  Location: [facility];  Service: Orthopedics;  Laterality: Right;   TONSILLECTOMY      Current Outpatient Medications  Medication Sig Dispense Refill    alendronate (FOSAMAX) 70 MG tablet TAKE 1 TABLET (70 MG TOTAL) BY MOUTH ONCE A WEEK. 12 tablet 3   Blood Glucose Monitoring Suppl (ONETOUCH VERIO REFLECT) w/Device KIT Check blood sugar twice a day and as directed. Dx E11.65 1 kit 0   cetirizine (ZYRTEC) 10 MG tablet Take 1 tablet (10 mg total) by mouth daily. 30 tablet 11   Continuous Blood Gluc Receiver (FREESTYLE LIBRE 2 READER) DEVI Use to check blood sugar continuous due to low blood sugars at night.  Updated Rx 1 each 0   Continuous Blood Gluc Sensor (FREESTYLE LIBRE 2 SENSOR) MISC Use to check blood sugar continuous due to low blood sugars at night.  Updated Rx 2 each 11   fluticasone (FLONASE) 50 MCG/ACT nasal spray Place 2 sprays into both nostrils daily. 16 g 6   ibuprofen (ADVIL) 600 MG tablet Take 1 tablet (600 mg total) by mouth every 6 (six) hours as needed. 30 tablet 0   Insulin Aspart FlexPen 100 UNIT/ML SOPN Using sliding scale to determine mealtime dose SQ injection, max dose per ay 20 units 3 mL 11   insulin glargine (LANTUS SOLOSTAR) 100 UNIT/ML Solostar Pen INJECT 45 UNITS INTO THE SKIN AT 10 PM. IF FASTING BLOOD SUGAR >120 X2 DAYS, INCREASE BY 2 UNITS 15 mL 11  Insulin Pen Needle (B-D ULTRAFINE III SHORT PEN) 31G X 8 MM MISC E11.59 100 each 3   Lancet Devices (ONE TOUCH DELICA LANCING DEV) MISC Check blood sugar twice a day and as directed. Dx E11.65 1 each 0   latanoprost (XALATAN) 0.005 % ophthalmic solution Place 1 drop into both eyes at bedtime.      lisinopril (ZESTRIL) 40 MG tablet Take 1 tablet (40 mg total) by mouth daily. 90 tablet 3   metFORMIN (GLUCOPHAGE-XR) 500 MG 24 hr tablet 2 tabs po BID 360 tablet 3   ONETOUCH VERIO test strip USE TO CHECK BLOOD SUGAR THREE TIMES DAILY. DX: E11.65 100 strip 11   pravastatin (PRAVACHOL) 40 MG tablet TAKE 1 TABLET BY MOUTH EVERY DAY 90 tablet 1   RESTASIS 0.05 % ophthalmic emulsion INSTILL 1 DROP INTO BOTH EYES TWICE A DAY INSTILL 1 DROP INTO BOTH EYES TWICE DAILY     sertraline  (ZOLOFT) 25 MG tablet TAKE 1 TABLET BY MOUTH EVERY DAY 90 tablet 1   SYSTANE ULTRA 0.4-0.3 % SOLN SMARTSIG:1 Drop(s) In Eye(s) As Needed     valACYclovir (VALTREX) 500 MG tablet TAKE 1 TABLET BY MOUTH 2 TIMES DAILY, TAKE FOR 3 DAYS FOR ACUTE FLARE 180 tablet 2   No current facility-administered medications for this visit.    Family History  Problem Relation Age of Onset   Depression Father    Diabetes Father    COPD Mother    Diabetes Mother    Macular degeneration Mother    Diabetes Brother    Cancer Other        liver   Diabetes Other     Review of Systems  All other systems reviewed and are negative.  Exam:   BP 118/70    Pulse 89    Ht 5' 6.25" (1.683 m)    Wt 181 lb (82.1 kg)    LMP 02/02/2006    SpO2 98%    BMI 28.99 kg/m     General appearance: alert, cooperative and appears stated age Head: normocephalic, without obvious abnormality, atraumatic Neck: no adenopathy, supple, symmetrical, trachea midline and thyroid normal to inspection and palpation Lungs: clear to auscultation bilaterally Breasts: normal appearance, no masses or tenderness, No nipple retraction or dimpling, No nipple discharge or bleeding, No axillary adenopathy Heart: regular rate and rhythm Abdomen: soft, non-tender; no masses, no organomegaly Extremities: extremities normal, atraumatic, no cyanosis or edema Skin: skin color, texture, turgor normal. No rashes or lesions Lymph nodes: cervical, supraclavicular, and axillary nodes normal. Neurologic: grossly normal  Pelvic: External genitalia:  no lesions              No abnormal inguinal nodes palpated.              Urethra:  normal appearing urethra with no masses, tenderness or lesions              Bartholins and Skenes: normal                 Vagina: normal appearing vagina with normal color and discharge, no lesions.  Second degree cystocele, first degree uterine prolapse, first degree rectocele.              Cervix: no lesions              Pap  taken: no Bimanual Exam:  Uterus:  normal size, contour, position, consistency, mobility, non-tender  Adnexa: no mass, fullness, tenderness              Rectal exam: yes.  Confirms.              Anus:  normal sphincter tone, no lesions  Chaperone was present for exam:  Estill Bamberg, CMA  Assessment:   Well woman visit with gynecologic exam. Hx HSV I and II.   Second degree cystocele, first degree uterine prolapse, first degree rectocele.  Relatively asymptomatic.  Hx elevated calcium. Osteopenia.  On Fosamax.  DM.  Plan: Mammogram screening discussed. Self breast awareness reviewed. Pap in 2 years.  Guidelines for Calcium, Vitamin D, regular exercise program including cardiovascular and weight bearing exercise. Using Valtrex prn through PCP.  We discussed signs of symptoms of progressive pelvic organ prolapse.  We discussed pelvic floor therapy, pessary use, and surgery for treating prolapse.  Follow up annually and prn.   After visit summary provided.   30 min  total time was spent for this patient encounter, including preparation, face-to-face counseling with the patient, coordination of care, and documentation of the encounter.

## 2021-01-21 ENCOUNTER — Ambulatory Visit (INDEPENDENT_AMBULATORY_CARE_PROVIDER_SITE_OTHER): Payer: HMO | Admitting: Obstetrics and Gynecology

## 2021-01-21 ENCOUNTER — Encounter: Payer: Self-pay | Admitting: Obstetrics and Gynecology

## 2021-01-21 ENCOUNTER — Other Ambulatory Visit: Payer: Self-pay

## 2021-01-21 VITALS — BP 118/70 | HR 89 | Ht 66.25 in | Wt 181.0 lb

## 2021-01-21 DIAGNOSIS — B009 Herpesviral infection, unspecified: Secondary | ICD-10-CM

## 2021-01-21 DIAGNOSIS — Z9189 Other specified personal risk factors, not elsewhere classified: Secondary | ICD-10-CM

## 2021-01-21 DIAGNOSIS — Z01419 Encounter for gynecological examination (general) (routine) without abnormal findings: Secondary | ICD-10-CM | POA: Diagnosis not present

## 2021-01-21 DIAGNOSIS — N819 Female genital prolapse, unspecified: Secondary | ICD-10-CM

## 2021-01-21 NOTE — Patient Instructions (Addendum)

## 2021-01-23 ENCOUNTER — Other Ambulatory Visit: Payer: Self-pay | Admitting: Family Medicine

## 2021-01-23 DIAGNOSIS — J029 Acute pharyngitis, unspecified: Secondary | ICD-10-CM

## 2021-01-28 ENCOUNTER — Other Ambulatory Visit: Payer: Self-pay | Admitting: Family Medicine

## 2021-01-30 ENCOUNTER — Encounter: Payer: Self-pay | Admitting: Family Medicine

## 2021-02-03 ENCOUNTER — Encounter: Payer: Self-pay | Admitting: Family Medicine

## 2021-02-11 DIAGNOSIS — L818 Other specified disorders of pigmentation: Secondary | ICD-10-CM | POA: Diagnosis not present

## 2021-02-11 DIAGNOSIS — L0889 Other specified local infections of the skin and subcutaneous tissue: Secondary | ICD-10-CM | POA: Diagnosis not present

## 2021-02-11 DIAGNOSIS — Z23 Encounter for immunization: Secondary | ICD-10-CM | POA: Diagnosis not present

## 2021-02-11 DIAGNOSIS — L578 Other skin changes due to chronic exposure to nonionizing radiation: Secondary | ICD-10-CM | POA: Diagnosis not present

## 2021-02-11 DIAGNOSIS — D225 Melanocytic nevi of trunk: Secondary | ICD-10-CM | POA: Diagnosis not present

## 2021-02-11 DIAGNOSIS — L720 Epidermal cyst: Secondary | ICD-10-CM | POA: Diagnosis not present

## 2021-02-11 DIAGNOSIS — L816 Other disorders of diminished melanin formation: Secondary | ICD-10-CM | POA: Diagnosis not present

## 2021-02-11 DIAGNOSIS — L814 Other melanin hyperpigmentation: Secondary | ICD-10-CM | POA: Diagnosis not present

## 2021-02-11 DIAGNOSIS — L821 Other seborrheic keratosis: Secondary | ICD-10-CM | POA: Diagnosis not present

## 2021-02-11 DIAGNOSIS — L570 Actinic keratosis: Secondary | ICD-10-CM | POA: Diagnosis not present

## 2021-02-11 DIAGNOSIS — D2271 Melanocytic nevi of right lower limb, including hip: Secondary | ICD-10-CM | POA: Diagnosis not present

## 2021-03-18 ENCOUNTER — Other Ambulatory Visit: Payer: Self-pay | Admitting: Family Medicine

## 2021-03-18 DIAGNOSIS — E1165 Type 2 diabetes mellitus with hyperglycemia: Secondary | ICD-10-CM

## 2021-04-20 ENCOUNTER — Other Ambulatory Visit: Payer: Self-pay | Admitting: Family Medicine

## 2021-05-21 ENCOUNTER — Telehealth: Payer: Self-pay | Admitting: Family Medicine

## 2021-05-21 NOTE — Telephone Encounter (Signed)
Left message for patient to call back to schedule Medicare Annual Wellness Visit  ? ?No hx of AWV eligible as of 11/02/20 ? ? ? ?Any questions, please call me at [phone]  ?

## 2021-06-02 DIAGNOSIS — E11319 Type 2 diabetes mellitus with unspecified diabetic retinopathy without macular edema: Secondary | ICD-10-CM | POA: Diagnosis not present

## 2021-06-02 LAB — HM DIABETES EYE EXAM

## 2021-06-06 ENCOUNTER — Encounter: Payer: Self-pay | Admitting: Family Medicine

## 2021-06-14 ENCOUNTER — Other Ambulatory Visit: Payer: Self-pay | Admitting: Family Medicine

## 2021-06-23 ENCOUNTER — Telehealth: Payer: Self-pay | Admitting: Family Medicine

## 2021-06-23 NOTE — Telephone Encounter (Signed)
Left message for patient to call back and schedule Medicare Annual Wellness Visit (AWV) either virtually or phone   Last WTM 11/03/19  please schedule at anytime with health coach  This should be a 45 minute visit.   I left my direct # [phone]

## 2021-07-07 DIAGNOSIS — Z1231 Encounter for screening mammogram for malignant neoplasm of breast: Secondary | ICD-10-CM | POA: Diagnosis not present

## 2021-07-09 ENCOUNTER — Other Ambulatory Visit: Payer: Self-pay | Admitting: Family Medicine

## 2021-07-09 ENCOUNTER — Encounter: Payer: Self-pay | Admitting: Obstetrics and Gynecology

## 2021-07-09 DIAGNOSIS — L816 Other disorders of diminished melanin formation: Secondary | ICD-10-CM | POA: Diagnosis not present

## 2021-07-13 ENCOUNTER — Other Ambulatory Visit: Payer: Self-pay | Admitting: Family Medicine

## 2021-07-13 DIAGNOSIS — I10 Essential (primary) hypertension: Secondary | ICD-10-CM

## 2021-07-24 ENCOUNTER — Ambulatory Visit
Admission: EM | Admit: 2021-07-24 | Discharge: 2021-07-24 | Disposition: A | Discharge status: Home / Self Care | Payer: HMO | Attending: Internal Medicine | Admitting: Internal Medicine

## 2021-07-24 DIAGNOSIS — R6883 Chills (without fever): Secondary | ICD-10-CM

## 2021-07-24 DIAGNOSIS — Z20828 Contact with and (suspected) exposure to other viral communicable diseases: Secondary | ICD-10-CM

## 2021-07-24 DIAGNOSIS — R5383 Other fatigue: Secondary | ICD-10-CM

## 2021-07-24 DIAGNOSIS — R52 Pain, unspecified: Secondary | ICD-10-CM | POA: Diagnosis not present

## 2021-07-24 LAB — POCT MONO SCREEN (KUC): Mono, POC: NEGATIVE

## 2021-07-24 NOTE — Discharge Instructions (Signed)
Monotest was negative.  COVID and flu test are pending.  We will call if they are positive.  It appears that you do have a viral illness that should run its course.  If symptoms persist or worsen or if you develop new symptoms, I recommend further evaluation.

## 2021-07-24 NOTE — ED Triage Notes (Signed)
Pt present body aches and headache with fatigue.symptoms started three days ago. Pt was recently exposed to Mono from a family member.

## 2021-07-24 NOTE — ED Provider Notes (Addendum)
[name]    CSN: [id number] Arrival date & time: 07/24/21  0855      History   Chief Complaint Chief Complaint  Patient presents with   Generalized Body Aches   Headache    HPI Jean Robinson is a 66 y.o. female.   Patient presents with body aches, fatigue, headache, chills that started about 3 days ago.  Patient reports that her grandson recently tested positive for mononucleosis.  Patient denies any associated cough, fever, nasal congestion, runny nose, sore throat, nausea, vomiting, abdominal pain, diarrhea.  Denies chest pain or shortness of breath as well.   Headache   Past Medical History:  Diagnosis Date   Achilles tendonitis    Allergy    Anxiety    Closed fracture of lateral portion of right tibial plateau 07/30/2017   COVID 2021   Depression    Diabetes mellitus    H/O renal calculi 10/08/2009   Hyperlipidemia    Hypertension    Serum calcium elevated    STD (sexually transmitted disease)    HSV I & II, IgG reflex positive   Tibial plateau fracture, right     Patient Active Problem List   Diagnosis Date Noted   Cellulitis and abscess of buttock 12/31/2020   Hyperparathyroidism ([place]) 09/19/2020   Osteopenia 09/19/2020   Hypertension associated with diabetes ([place]) 03/17/2019   Motion sickness 03/17/2019   Disorder of right Achilles tendon 03/17/2019   Advice given about COVID-19 virus infection 07/21/2018   Pyelonephritis 12/28/2017   High serum calcium 10/01/2017   Closed fracture of lateral portion of right tibial plateau 07/30/2017   OA (osteoarthritis) of finger 03/05/2017   Genital herpes 10/10/2015   Uncontrolled type 2 diabetes mellitus with hyperglycemia ([place])    DEPRESSION, MILD 02/27/2008   Hyperlipidemia associated with type 2 diabetes mellitus ([place]) 11/18/2006   ALLERGIC RHINITIS 11/18/2006    Past Surgical History:  Procedure Laterality Date   APPENDECTOMY     CHOLECYSTECTOMY     ORIF TIBIA PLATEAU Right 07/30/2017    Procedure: OPEN REDUCTION INTERNAL FIXATION (ORIF) TIBIAL PLATEAU;  Surgeon: Marchia Bond, MD;  Location: [facility];  Service: Orthopedics;  Laterality: Right;   TONSILLECTOMY      OB History     Gravida  4   Para  4   Term  4   Preterm  0   AB  0   Living  4      SAB  0   IAB  0   Ectopic  0   Multiple  0   Live Births  4            Home Medications    Prior to Admission medications   Medication Sig Start Date End Date Taking? Authorizing Provider  alendronate (FOSAMAX) 70 MG tablet TAKE 1 TABLET (70 MG TOTAL) BY MOUTH ONCE A WEEK. 04/20/21   Bedsole, Amy E, MD  Blood Glucose Monitoring Suppl (ONETOUCH VERIO REFLECT) w/Device KIT Check blood sugar twice a day and as directed. Dx E11.65 01/09/20   Jinny Sanders, MD  cetirizine (ZYRTEC) 10 MG tablet Take 1 tablet (10 mg total) by mouth daily. 10/30/18   Sharion Balloon, FNP  Continuous Blood Gluc Receiver (FREESTYLE LIBRE 2 READER) DEVI Use to check blood sugar continuous due to low blood sugars at night.  Updated Rx 11/25/20   Jinny Sanders, MD  Continuous Blood Gluc Sensor (FREESTYLE LIBRE 2 SENSOR) MISC Use to check  blood sugar continuous due to low blood sugars at night.  Updated Rx 11/25/20   Bedsole, Amy E, MD  fluticasone (FLONASE) 50 MCG/ACT nasal spray SPRAY 2 SPRAYS INTO EACH NOSTRIL EVERY DAY 01/23/21   Bedsole, Amy E, MD  ibuprofen (ADVIL) 600 MG tablet Take 1 tablet (600 mg total) by mouth every 6 (six) hours as needed. 06/24/20   Melynda Ripple, MD  Insulin Aspart FlexPen 100 UNIT/ML SOPN Using sliding scale to determine mealtime dose SQ injection, max dose per ay 20 units 08/08/20   Bedsole, Amy E, MD  insulin glargine (LANTUS SOLOSTAR) 100 UNIT/ML Solostar Pen INJECT 45 UNITS INTO THE SKIN AT 10 PM. IF FASTING BLOOD SUGAR >120 X2 DAYS, INCREASE BY 2 UNITS 10/06/20   Bedsole, Amy E, MD  Insulin Pen Needle (B-D ULTRAFINE III SHORT PEN) 31G X 8 MM MISC USE 1 PEN AS DIRECTED 03/19/21    Bedsole, Amy E, MD  Lancet Devices (ONE TOUCH DELICA LANCING DEV) MISC Check blood sugar twice a day and as directed. Dx E11.65 01/05/20   Jinny Sanders, MD  latanoprost (XALATAN) 0.005 % ophthalmic solution Place 1 drop into both eyes at bedtime.  07/26/15   [provider]  lisinopril (ZESTRIL) 40 MG tablet TAKE 1 TABLET BY MOUTH EVERY DAY 07/13/21   Bedsole, Amy E, MD  metFORMIN (GLUCOPHAGE-XR) 500 MG 24 hr tablet 2 tabs po BID 08/08/20   Bedsole, Amy E, MD  ONETOUCH VERIO test strip USE TO CHECK BLOOD SUGAR THREE TIMES DAILY. DX: E11.65 01/28/21   Bedsole, Amy E, MD  pravastatin (PRAVACHOL) 40 MG tablet TAKE 1 TABLET BY MOUTH EVERY DAY 06/15/21   Bedsole, Amy E, MD  RESTASIS 0.05 % ophthalmic emulsion INSTILL 1 DROP INTO BOTH EYES TWICE A DAY INSTILL 1 DROP INTO BOTH EYES TWICE DAILY 09/13/18   [provider]  sertraline (ZOLOFT) 25 MG tablet TAKE 1 TABLET BY MOUTH EVERY DAY 07/09/21   Bedsole, Amy E, MD  SYSTANE ULTRA 0.4-0.3 % SOLN SMARTSIG:1 Drop(s) In Eye(s) As Needed 10/26/18   [provider]  valACYclovir (VALTREX) 500 MG tablet TAKE 1 TABLET BY MOUTH 2 TIMES DAILY, TAKE FOR 3 DAYS FOR ACUTE FLARE 11/25/20   Jinny Sanders, MD    Family History Family History  Problem Relation Age of Onset   Depression Father    Diabetes Father    COPD Mother    Diabetes Mother    Macular degeneration Mother    Diabetes Brother    Cancer Other        liver   Diabetes Other     Social History Social History   Tobacco Use   Smoking status: Never   Smokeless tobacco: Never  Vaping Use   Vaping Use: Never used  Substance Use Topics   Alcohol use: Yes    Alcohol/week: 0.0 - 2.0 standard drinks of alcohol    Comment: 2 drinks/month   Drug use: No     Allergies   Patient has no known allergies.   Review of Systems Review of Systems Per HPI  Physical Exam Triage Vital Signs ED Triage Vitals  Enc Vitals Group     BP 07/24/21 0911 (!) 156/92     Pulse Rate  07/24/21 0911 (!) 117     Resp 07/24/21 0911 18     Temp 07/24/21 0911 99.2 F (37.3 C)     Temp Source 07/24/21 0911 Oral     SpO2 07/24/21 0911 93 %  Weight --      Height --      Head Circumference --      Peak Flow --      Pain Score 07/24/21 0910 8     Pain Loc --      Pain Edu? --      Excl. in [place]? --    No data found.  Updated Vital Signs BP (!) 156/92 (BP Location: Left Arm)   Pulse (!) 117   Temp 99.2 F (37.3 C) (Oral)   Resp 18   LMP 02/02/2006   SpO2 96%   Visual Acuity Right Eye Distance:   Left Eye Distance:   Bilateral Distance:    Right Eye Near:   Left Eye Near:    Bilateral Near:     Physical Exam Constitutional:      General: She is not in acute distress.    Appearance: Normal appearance. She is not toxic-appearing or diaphoretic.  HENT:     Head: Normocephalic and atraumatic.     Right Ear: Tympanic membrane and ear canal normal.     Left Ear: Tympanic membrane and ear canal normal.     Nose: Nose normal.     Mouth/Throat:     Mouth: Mucous membranes are moist.     Pharynx: No posterior oropharyngeal erythema.  Eyes:     Extraocular Movements: Extraocular movements intact.     Conjunctiva/sclera: Conjunctivae normal.     Pupils: Pupils are equal, round, and reactive to light.  Cardiovascular:     Rate and Rhythm: Normal rate and regular rhythm.     Pulses: Normal pulses.     Heart sounds: Normal heart sounds.  Pulmonary:     Effort: Pulmonary effort is normal. No respiratory distress.     Breath sounds: Normal breath sounds.  Abdominal:     General: Bowel sounds are normal. There is no distension.     Palpations: Abdomen is soft.     Tenderness: There is no abdominal tenderness.  Skin:    General: Skin is warm and dry.  Neurological:     General: No focal deficit present.     Mental Status: She is alert and oriented to person, place, and time. Mental status is at baseline.  Psychiatric:        Mood and Affect: Mood normal.         Behavior: Behavior normal.        Thought Content: Thought content normal.        Judgment: Judgment normal.      UC Treatments / Results  Labs (all labs ordered are listed, but only abnormal results are displayed) Labs Reviewed  COVID-19, FLU A+B NAA  POCT MONO SCREEN ([facility])    EKG   Radiology No results found.  Procedures Procedures (including critical care time)  Medications Ordered in UC Medications - No data to display  Initial Impression / Assessment and Plan / UC Course  I have reviewed the triage vital signs and the nursing notes.  Pertinent labs & imaging results that were available during my care of the patient were reviewed by me and considered in my medical decision making (see chart for details).     Rapid monotest was negative.  Suspect viral cause to patient's symptoms.  COVID and flu test pending.  Discussed supportive care and symptom management of viral illness with patient.  Suspect tachycardia is related to mild fever as patient reports that her normal temperature is 96 as  well as due to acute illness.  Patient advised to follow-up if symptoms persist or worsen.  Patient verbalized understanding and was agreeable with plan. Final Clinical Impressions(s) / UC Diagnoses   Final diagnoses:  Generalized body aches  Chills  Other fatigue  Exposure to mononucleosis syndrome     Discharge Instructions      Monotest was negative.  COVID and flu test are pending.  We will call if they are positive.  It appears that you do have a viral illness that should run its course.  If symptoms persist or worsen or if you develop new symptoms, I recommend further evaluation.      ED Prescriptions   None    PDMP not reviewed this encounter.   Teodora Medici, [place] 07/24/21 [place], [place], [place] 07/24/21 [phone]

## 2021-07-25 LAB — COVID-19, FLU A+B NAA
Influenza A, NAA: NOT DETECTED
Influenza B, NAA: NOT DETECTED
SARS-CoV-2, NAA: NOT DETECTED

## 2021-07-28 ENCOUNTER — Ambulatory Visit (INDEPENDENT_AMBULATORY_CARE_PROVIDER_SITE_OTHER): Payer: HMO | Admitting: Nurse Practitioner

## 2021-07-28 ENCOUNTER — Encounter: Payer: Self-pay | Admitting: Nurse Practitioner

## 2021-07-28 VITALS — BP 128/73 | HR 109 | Temp 98.1°F | Ht 66.0 in | Wt 181.8 lb

## 2021-07-28 DIAGNOSIS — R5383 Other fatigue: Secondary | ICD-10-CM | POA: Diagnosis not present

## 2021-07-28 DIAGNOSIS — M2550 Pain in unspecified joint: Secondary | ICD-10-CM | POA: Diagnosis not present

## 2021-07-28 MED ORDER — DOXYCYCLINE HYCLATE 100 MG PO TABS: 100.0000 mg | ORAL_TABLET | Freq: Two times a day (BID) | ORAL | 0 refills | Status: DC

## 2021-07-28 MED ORDER — FLUCONAZOLE 150 MG PO TABS: 150.0000 mg | ORAL_TABLET | Freq: Every day | ORAL | 0 refills | Status: DC

## 2021-07-29 DIAGNOSIS — M2550 Pain in unspecified joint: Secondary | ICD-10-CM | POA: Insufficient documentation

## 2021-07-29 LAB — COMPREHENSIVE METABOLIC PANEL
ALT: 25 U/L (ref 0–35)
AST: 22 U/L (ref 0–37)
Albumin: 4.2 g/dL (ref 3.5–5.2)
Alkaline Phosphatase: 64 U/L (ref 39–117)
BUN: 28 mg/dL — ABNORMAL HIGH (ref 6–23)
CO2: 23 mEq/L (ref 19–32)
Calcium: 11 mg/dL — ABNORMAL HIGH (ref 8.4–10.5)
Chloride: 101 mEq/L (ref 96–112)
Creatinine, Ser: 1.29 mg/dL — ABNORMAL HIGH (ref 0.40–1.20)
GFR: 42.81 mL/min — ABNORMAL LOW (ref >60.00)
Glucose, Bld: 196 mg/dL — ABNORMAL HIGH (ref 70–99)
Potassium: 4.4 mEq/L (ref 3.5–5.1)
Sodium: 136 mEq/L (ref 135–145)
Total Bilirubin: 0.4 mg/dL (ref 0.2–1.2)
Total Protein: 7.4 g/dL (ref 6.0–8.3)

## 2021-07-29 LAB — CBC WITH DIFFERENTIAL/PLATELET
Basophils Absolute: 0.1 10*3/uL (ref 0.0–0.1)
Basophils Relative: 1.4 % (ref 0.0–3.0)
Eosinophils Absolute: 0.1 10*3/uL (ref 0.0–0.7)
Eosinophils Relative: 0.9 % (ref 0.0–5.0)
HCT: 38.1 % (ref 36.0–46.0)
Hemoglobin: 12.9 g/dL (ref 12.0–15.0)
Lymphocytes Relative: 42.3 % (ref 12.0–46.0)
Lymphs Abs: 4 10*3/uL (ref 0.7–4.0)
MCHC: 33.8 g/dL (ref 30.0–36.0)
MCV: 94.7 fl (ref 78.0–100.0)
Monocytes Absolute: 0.8 10*3/uL (ref 0.1–1.0)
Monocytes Relative: 8.2 % (ref 3.0–12.0)
Neutro Abs: 4.4 10*3/uL (ref 1.4–7.7)
Neutrophils Relative %: 47.2 % (ref 43.0–77.0)
Platelets: 288 10*3/uL (ref 150.0–400.0)
RBC: 4.02 Mil/uL (ref 3.87–5.11)
RDW: 12.5 % (ref 11.5–15.5)
WBC: 9.4 10*3/uL (ref 4.0–10.5)

## 2021-07-29 LAB — C-REACTIVE PROTEIN: CRP: 1.4 mg/dL (ref 0.5–20.0)

## 2021-07-30 ENCOUNTER — Encounter: Payer: Self-pay | Admitting: Nurse Practitioner

## 2021-07-31 ENCOUNTER — Telehealth: Payer: Self-pay | Admitting: Nurse Practitioner

## 2021-07-31 NOTE — Telephone Encounter (Signed)
Pt stated that she is returning your call about her lab results . Please call pt back

## 2021-08-01 LAB — RICKETTSIAL FEVER GROUP IGG/M
Spotted Fever Group IgG: <1:64 {titer}
Spotted Fever Group IgM: <1:64 {titer}
Typhus Fever Group IgG: <1:64 {titer}
Typhus Fever Group IgM: <1:64 {titer}

## 2021-08-01 NOTE — Telephone Encounter (Signed)
Called and ldvm. Sw,cma Advised to call back directly if there are further questions. 

## 2021-08-02 LAB — ROCKY MTN SPOTTED FVR ABS PNL(IGG+IGM)
RMSF IgG: DETECTED — AB
RMSF IgM: NOT DETECTED

## 2021-08-02 LAB — B. BURGDORFI ANTIBODIES: B burgdorferi Ab IgG+IgM: <0.9 index

## 2021-08-02 LAB — EPSTEIN-BARR VIRUS VCA ANTIBODY PANEL
EBV NA IgG: 54.7 U/mL — ABNORMAL HIGH
EBV VCA IgG: 733 U/mL — ABNORMAL HIGH
EBV VCA IgM: <36 U/mL

## 2021-08-02 LAB — REFLEX RMSF IGG TITER: RMSF IgG Titer: 1:64 {titer} — ABNORMAL HIGH

## 2021-08-06 ENCOUNTER — Encounter: Payer: Self-pay | Admitting: Family Medicine

## 2021-08-24 ENCOUNTER — Telehealth: Payer: Self-pay | Admitting: Family Medicine

## 2021-08-24 NOTE — Telephone Encounter (Signed)
Please schedule 40 minute CPE with fasting labs prior with Dr. Diona Browner.

## 2021-08-26 ENCOUNTER — Ambulatory Visit (INDEPENDENT_AMBULATORY_CARE_PROVIDER_SITE_OTHER): Payer: PPO

## 2021-08-26 ENCOUNTER — Ambulatory Visit: Payer: PPO | Admitting: Podiatry

## 2021-08-26 DIAGNOSIS — M67971 Unspecified disorder of synovium and tendon, right ankle and foot: Secondary | ICD-10-CM

## 2021-08-26 DIAGNOSIS — M7661 Achilles tendinitis, right leg: Secondary | ICD-10-CM

## 2021-08-26 MED ORDER — MELOXICAM 7.5 MG PO TABS: 7.5000 mg | ORAL_TABLET | Freq: Every day | ORAL | 0 refills | Status: DC | PRN

## 2021-08-26 MED ORDER — NITROGLYCERIN 0.2 MG/HR TD PT24: 0.2000 mg | MEDICATED_PATCH | Freq: Every day | TRANSDERMAL | 0 refills | Status: DC

## 2021-08-26 NOTE — Patient Instructions (Signed)
Walking Boot, Adult  A walking boot holds your foot or ankle in place after an injury or a medical procedure. This helps with healing and prevents further injury. It has a hard, rigid outer frame that limits movement and supports your foot and leg. The inner lining is a layer of padded material. Walking boots also have adjustable straps to secure them over the foot and leg. A walking boot may be prescribed if you can put weight (bear weight) on your injured foot. How much you can walk while wearing the boot will depend on the type and severity of your injury. How to put on a walking boot There are different types of walking boots. Each type has specific instructions about how to wear it properly. Follow instructions from your health care provider, such as: Ask someone to help you put on the boot, if needed. Sit to put on your boot. Doing this is more comfortable and helps to prevent falls. Open up the boot fully. Place your foot into the boot so your heel rests against the back. Your toes should be supported by the base of the boot. They should not hang over the front edge. Adjust the straps so the boot fits securely but is not too tight. Do not bend the hard frame of the boot to get a good fit. How to walk with a walking boot How much you can walk will depend on your injury. Some tips for managing with a boot include: Do not try to walk without wearing the boot unless your health care provider approves. Use other assistive walking devices, including crutches or canes, as told by your health care provider. On your uninjured foot, wear a shoe with a heel that is close to the height of the walking boot. Be careful when walking on surfaces that are uneven or wet. How to reduce swelling while using a walking boot  Rest your injured foot or leg as much as possible. If directed, put ice on the injured area. To do this: Put ice in a plastic bag. Place a towel between your skin and the bag. Leave the  ice on for 20 minutes, 2-3 times a day. Remove the ice if your skin turns bright red. This is very important. If you cannot feel pain, heat, or cold, you have a greater risk of damage to the area. Keep your injured foot or leg raised (elevated) above the level of your heart for at least 2?3 hours each day or as told by your health care provider. If swelling gets worse, loosen the boot. Rest and elevate your foot and leg. How to care for your skin and foot while using a walking boot Wear a long sock to protect your foot and leg from rubbing inside the boot. Take off the boot one time each day to check the injured area. Check your foot, the surrounding skin, and your leg to make sure there are no sores, rashes, swelling, or wounds. The skin should be a healthy color, not pale or blue. Try to notice if your walking pattern (gait) in the boot is fairly normal and that you are walking without a noticeable limp. Follow instructions from your health care provider about taking care of your incision or wound, if this applies. Clean and wash the injured area as told by your health care provider. Gently dry your foot and leg before putting the boot back on. Removing your walking boot Follow directions from your health care provider for removing the   walking boot. Generally, it is okay to remove your walking boot: When you are resting or sleeping. To clean your foot and leg. How to keep the walking boot clean Do not put any part of the boot in a washing machine or dryer. Do not use chemical cleaning products. These could irritate your skin, especially if you have a wound or an incision. Do not soak the liner of the boot. Use a washcloth with mild soap and water to clean the frame and the liner of the boot by hand. Allow the boot to air-dry completely before you put it back on your foot. Follow these instructions at home: Activity Your activity will be restricted depending on the type and severity of your  injury. Follow instructions from your health care provider. Also: Bathe and shower as told by your health care provider. Do not do any activities that could make your injury worse. Do not drive if your affected foot is the one that you use for driving. Contact a health care provider if: The boot is cracked or damaged. The boot does not fit properly. Your foot or leg hurts. You have a rash, sore, or open sore (ulcer) on your foot or leg. The skin on your foot or leg is pale. You have a wound or incision on the foot and it is getting worse. Your skin becomes painful, red, or irritated. Your swelling does not get better or it gets worse. Get help right away if: You have numbness in your foot or leg. The skin on your foot or leg is cold, blue, or gray. Summary A walking boot holds your foot or ankle in place after an injury or a medical procedure. There are different types of walking boots. Follow instructions about how to correctly wear your boot. Ask someone to help you put on the boot, if needed. It is important to check your skin and foot every day. Call your health care provider if you notice a rash or sore on your foot or leg. This information is not intended to replace advice given to you by your health care provider. Make sure you discuss any questions you have with your health care provider. Document Revised: 11/13/2019 Document Reviewed: 11/13/2019 Elsevier Patient Education  2023 [employer]  

## 2021-08-26 NOTE — Telephone Encounter (Signed)
LVM for patient to return call and schedule.

## 2021-08-26 NOTE — Progress Notes (Signed)
Subjective:   Patient ID: Jean Robinson, female   DOB: 66 y.o.   MRN: [id number]   HPI Chief Complaint  Patient presents with   Foot Pain    Patient states she is unable to easily on surfaces that are not flat. It has progressively gotten worse over time. Onset about 1 year ago.   66 year old female presents with above complaints.  She states that over the last year she has had increased pain along the Achilles tendon and she has noted some thickening on the tendon.  No specific injuries that she reports.  She likes to hike and be active however this is limiting her.  She gets occasional swelling but not as bad today.  She tried nitroglycerin patches which helps some but is not long-lasting.  No injuries.  No other concerns.     ROS  Past Medical History:  Diagnosis Date   Achilles tendonitis    Allergy    Anxiety    Closed fracture of lateral portion of right tibial plateau 07/30/2017   COVID 2021   Depression    Diabetes mellitus    H/O renal calculi 10/08/2009   Hyperlipidemia    Hypertension    Serum calcium elevated    STD (sexually transmitted disease)    HSV I & II, IgG reflex positive   Tibial plateau fracture, right     Past Surgical History:  Procedure Laterality Date   APPENDECTOMY     CHOLECYSTECTOMY     ORIF TIBIA PLATEAU Right 07/30/2017   Procedure: OPEN REDUCTION INTERNAL FIXATION (ORIF) TIBIAL PLATEAU;  Surgeon: Marchia Bond, MD;  Location: [facility];  Service: Orthopedics;  Laterality: Right;   TONSILLECTOMY       Current Outpatient Medications:    meloxicam (MOBIC) 7.5 MG tablet, Take 1 tablet (7.5 mg total) by mouth daily as needed for pain., Disp: 30 tablet, Rfl: 0   nitroGLYCERIN (NITRO-DUR) 0.2 mg/hr patch, Place 1 patch (0.2 mg total) onto the skin daily. Apply to Achilles tendon, Disp: 30 patch, Rfl: 0   alendronate (FOSAMAX) 70 MG tablet, TAKE 1 TABLET (70 MG TOTAL) BY MOUTH ONCE A WEEK., Disp: 12 tablet, Rfl: 3   Blood Glucose  Monitoring Suppl (ONETOUCH VERIO REFLECT) w/Device KIT, Check blood sugar twice a day and as directed. Dx E11.65, Disp: 1 kit, Rfl: 0   cetirizine (ZYRTEC) 10 MG tablet, Take 1 tablet (10 mg total) by mouth daily., Disp: 30 tablet, Rfl: 11   Continuous Blood Gluc Receiver (FREESTYLE LIBRE 2 READER) DEVI, Use to check blood sugar continuous due to low blood sugars at night.  Updated Rx, Disp: 1 each, Rfl: 0   Continuous Blood Gluc Sensor (FREESTYLE LIBRE 2 SENSOR) MISC, Use to check blood sugar continuous due to low blood sugars at night.  Updated Rx, Disp: 2 each, Rfl: 11   doxycycline (VIBRA-TABS) 100 MG tablet, Take 1 tablet (100 mg total) by mouth 2 (two) times daily., Disp: 28 tablet, Rfl: 0   fluconazole (DIFLUCAN) 150 MG tablet, Take 1 tablet (150 mg total) by mouth daily. Take 1 tablet after you finish the antibiotic and then if you need a second in 3 days, take a second tablet, Disp: 2 tablet, Rfl: 0   fluticasone (FLONASE) 50 MCG/ACT nasal spray, SPRAY 2 SPRAYS INTO EACH NOSTRIL EVERY DAY, Disp: 48 mL, Rfl: 3   ibuprofen (ADVIL) 600 MG tablet, Take 1 tablet (600 mg total) by mouth every 6 (six) hours as needed., Disp: 30  tablet, Rfl: 0   Insulin Aspart FlexPen 100 UNIT/ML SOPN, Using sliding scale to determine mealtime dose SQ injection, max dose per ay 20 units, Disp: 3 mL, Rfl: 11   insulin glargine (LANTUS SOLOSTAR) 100 UNIT/ML Solostar Pen, INJECT 45 UNITS INTO THE SKIN AT 10 PM. IF FASTING BLOOD SUGAR >120 X2 DAYS, INCREASE BY 2 UNITS, Disp: 15 mL, Rfl: 11   Insulin Pen Needle (B-D ULTRAFINE III SHORT PEN) 31G X 8 MM MISC, USE 1 PEN AS DIRECTED, Disp: 100 each, Rfl: 1   Lancet Devices (ONE TOUCH DELICA LANCING DEV) MISC, Check blood sugar twice a day and as directed. Dx E11.65, Disp: 1 each, Rfl: 0   latanoprost (XALATAN) 0.005 % ophthalmic solution, Place 1 drop into both eyes at bedtime. , Disp: , Rfl:    lisinopril (ZESTRIL) 40 MG tablet, TAKE 1 TABLET BY MOUTH EVERY DAY, Disp: 90  tablet, Rfl: 0   metFORMIN (GLUCOPHAGE-XR) 500 MG 24 hr tablet, TAKE 2 TABLETS BY MOUTH TWICE A DAY, Disp: 360 tablet, Rfl: 0   ONETOUCH VERIO test strip, USE TO CHECK BLOOD SUGAR THREE TIMES DAILY. DX: E11.65, Disp: 100 strip, Rfl: 11   pravastatin (PRAVACHOL) 40 MG tablet, TAKE 1 TABLET BY MOUTH EVERY DAY, Disp: 90 tablet, Rfl: 1   RESTASIS 0.05 % ophthalmic emulsion, INSTILL 1 DROP INTO BOTH EYES TWICE A DAY INSTILL 1 DROP INTO BOTH EYES TWICE DAILY, Disp: , Rfl:    sertraline (ZOLOFT) 25 MG tablet, TAKE 1 TABLET BY MOUTH EVERY DAY, Disp: 90 tablet, Rfl: 0   SYSTANE ULTRA 0.4-0.3 % SOLN, SMARTSIG:1 Drop(s) In Eye(s) As Needed, Disp: , Rfl:    valACYclovir (VALTREX) 500 MG tablet, TAKE 1 TABLET BY MOUTH 2 TIMES DAILY, TAKE FOR 3 DAYS FOR ACUTE FLARE, Disp: 180 tablet, Rfl: 2  No Known Allergies       Objective:  Physical Exam  General: AAO x3, NAD  Dermatological: Skin is warm, dry and supple bilateral. There are no open sores, no preulcerative lesions, no rash or signs of infection present.  Vascular: Dorsalis Pedis artery and Posterior Tibial artery pedal pulses are 2/4 bilateral with immedate capillary fill time. There is no pain with calf compression, swelling, warmth, erythema.   Neruologic: Grossly intact via light touch bilateral.  No Tinel sign.  Musculoskeletal: On the right side there is thickening along the mid substance of the Achilles tendon with tenderness palpation.  Clinically the tendon appears to be intact.  No pain with lateral compression of calcaneus there is no area pinpoint tenderness.  Slight edema there is no erythema or warmth.  Gait: Unassisted, Nonantalgic.     Assessment:   66 year old female with right Achilles tendinitis     Plan:  -Treatment options discussed including all alternatives, risks, and complications -Etiology of symptoms were discussed -X-rays were obtained and reviewed with the patient.  3 views of the right ankle obtained.  No  evidence of acute fracture.  There is increased edema noted along Kager's triangle compared to previous x-rays. -Short-term we discussed immobilization in cam boot as she brought this up.  Dispensed cam boot with a heel lift. -Prescribed meloxicam to take as needed.  If not using that she can use Voltaren gel. -Nitropatch was reordered -Discussed stretching, icing daily. -Referral to physical therapy  No follow-ups on file.  Trula Slade DPM

## 2021-09-04 NOTE — Telephone Encounter (Signed)
Patient called back to schedule and the physicals are booked out until October, She wants to know if she can either be worked in sooner or if she can schedule to have her A1C checked with just a follow up for now. Please advise. Call back is [phone]

## 2021-09-04 NOTE — Telephone Encounter (Signed)
Okay to work her in sooner for physical with fasting labs prior

## 2021-09-05 NOTE — Telephone Encounter (Signed)
Scheduled

## 2021-09-15 DIAGNOSIS — M7661 Achilles tendinitis, right leg: Secondary | ICD-10-CM | POA: Diagnosis not present

## 2021-09-15 DIAGNOSIS — R269 Unspecified abnormalities of gait and mobility: Secondary | ICD-10-CM | POA: Diagnosis not present

## 2021-09-15 DIAGNOSIS — M25671 Stiffness of right ankle, not elsewhere classified: Secondary | ICD-10-CM | POA: Diagnosis not present

## 2021-09-15 DIAGNOSIS — M6281 Muscle weakness (generalized): Secondary | ICD-10-CM | POA: Diagnosis not present

## 2021-09-18 ENCOUNTER — Other Ambulatory Visit: Payer: Self-pay | Admitting: Family Medicine

## 2021-09-18 DIAGNOSIS — E1165 Type 2 diabetes mellitus with hyperglycemia: Secondary | ICD-10-CM

## 2021-09-19 DIAGNOSIS — M25671 Stiffness of right ankle, not elsewhere classified: Secondary | ICD-10-CM | POA: Diagnosis not present

## 2021-09-19 DIAGNOSIS — M6281 Muscle weakness (generalized): Secondary | ICD-10-CM | POA: Diagnosis not present

## 2021-09-19 DIAGNOSIS — R269 Unspecified abnormalities of gait and mobility: Secondary | ICD-10-CM | POA: Diagnosis not present

## 2021-09-19 DIAGNOSIS — M7661 Achilles tendinitis, right leg: Secondary | ICD-10-CM | POA: Diagnosis not present

## 2021-09-20 ENCOUNTER — Telehealth: Payer: Self-pay | Admitting: Family Medicine

## 2021-09-20 DIAGNOSIS — E1165 Type 2 diabetes mellitus with hyperglycemia: Secondary | ICD-10-CM

## 2021-09-20 NOTE — Telephone Encounter (Signed)
-----   Message from Ellamae Sia sent at 09/11/2021  3:40 PM EDT ----- Regarding: Lab orders for Tuesday, 8.22.23 Patient is scheduled for CPX labs, please order future labs, Thanks , Karna Christmas

## 2021-09-22 ENCOUNTER — Other Ambulatory Visit (INDEPENDENT_AMBULATORY_CARE_PROVIDER_SITE_OTHER): Payer: HMO

## 2021-09-22 ENCOUNTER — Other Ambulatory Visit: Payer: Self-pay | Admitting: Podiatry

## 2021-09-22 DIAGNOSIS — E1165 Type 2 diabetes mellitus with hyperglycemia: Secondary | ICD-10-CM

## 2021-09-22 LAB — LIPID PANEL
Cholesterol: 154 mg/dL (ref 0–200)
HDL: 48.7 mg/dL (ref >39.00)
LDL Cholesterol: 81 mg/dL (ref 0–99)
NonHDL: 105.39
Total CHOL/HDL Ratio: 3
Triglycerides: 121 mg/dL (ref 0.0–149.0)
VLDL: 24.2 mg/dL (ref 0.0–40.0)

## 2021-09-22 LAB — COMPREHENSIVE METABOLIC PANEL
ALT: 14 U/L (ref 0–35)
AST: 15 U/L (ref 0–37)
Albumin: 4 g/dL (ref 3.5–5.2)
Alkaline Phosphatase: 60 U/L (ref 39–117)
BUN: 18 mg/dL (ref 6–23)
CO2: 25 mEq/L (ref 19–32)
Calcium: 10.2 mg/dL (ref 8.4–10.5)
Chloride: 105 mEq/L (ref 96–112)
Creatinine, Ser: 0.88 mg/dL (ref 0.40–1.20)
GFR: 67.67 mL/min (ref >60.00)
Glucose, Bld: 89 mg/dL (ref 70–99)
Potassium: 4.3 mEq/L (ref 3.5–5.1)
Sodium: 139 mEq/L (ref 135–145)
Total Bilirubin: 0.4 mg/dL (ref 0.2–1.2)
Total Protein: 6.7 g/dL (ref 6.0–8.3)

## 2021-09-22 LAB — HEMOGLOBIN A1C: Hgb A1c MFr Bld: 8.7 % — ABNORMAL HIGH (ref 4.6–6.5)

## 2021-09-23 ENCOUNTER — Other Ambulatory Visit: Payer: HMO

## 2021-09-23 NOTE — Progress Notes (Signed)
No critical labs need to be addressed urgently. We will discuss labs in detail at upcoming office visit.   

## 2021-09-26 ENCOUNTER — Encounter: Payer: Self-pay | Admitting: Family Medicine

## 2021-09-26 ENCOUNTER — Ambulatory Visit (INDEPENDENT_AMBULATORY_CARE_PROVIDER_SITE_OTHER): Payer: HMO | Admitting: Family Medicine

## 2021-09-26 VITALS — BP 138/80 | HR 84 | Temp 97.8°F | Ht 66.25 in | Wt 181.4 lb

## 2021-09-26 DIAGNOSIS — I10 Essential (primary) hypertension: Secondary | ICD-10-CM

## 2021-09-26 DIAGNOSIS — E1165 Type 2 diabetes mellitus with hyperglycemia: Secondary | ICD-10-CM | POA: Diagnosis not present

## 2021-09-26 DIAGNOSIS — I152 Hypertension secondary to endocrine disorders: Secondary | ICD-10-CM | POA: Diagnosis not present

## 2021-09-26 DIAGNOSIS — A6009 Herpesviral infection of other urogenital tract: Secondary | ICD-10-CM | POA: Diagnosis not present

## 2021-09-26 DIAGNOSIS — E1159 Type 2 diabetes mellitus with other circulatory complications: Secondary | ICD-10-CM | POA: Diagnosis not present

## 2021-09-26 DIAGNOSIS — E1169 Type 2 diabetes mellitus with other specified complication: Secondary | ICD-10-CM | POA: Diagnosis not present

## 2021-09-26 DIAGNOSIS — E785 Hyperlipidemia, unspecified: Secondary | ICD-10-CM | POA: Diagnosis not present

## 2021-09-26 DIAGNOSIS — Z0000 Encounter for general adult medical examination without abnormal findings: Secondary | ICD-10-CM | POA: Diagnosis not present

## 2021-09-26 LAB — HM DIABETES FOOT EXAM

## 2021-09-26 MED ORDER — LISINOPRIL 40 MG PO TABS: 40.0000 mg | ORAL_TABLET | Freq: Every day | ORAL | 3 refills | Status: DC

## 2021-09-26 MED ORDER — VALACYCLOVIR HCL 500 MG PO TABS: ORAL_TABLET | ORAL | 2 refills | Status: DC

## 2021-09-26 MED ORDER — SERTRALINE HCL 25 MG PO TABS: 25.0000 mg | ORAL_TABLET | Freq: Every day | ORAL | 1 refills | Status: DC

## 2021-09-26 NOTE — Progress Notes (Signed)
Patient ID: Jean Robinson, female    DOB: 1955/11/27, 66 y.o.   MRN: [id number]  This visit was conducted in person.  BP 138/80   Pulse 84   Temp 97.8 F (36.6 C) (Oral)   Ht 5' 6.25" (1.683 m)   Wt 181 lb 6 oz (82.3 kg)   LMP 02/02/2006   SpO2 96%   BMI 29.05 kg/m    CC:  Chief Complaint  Patient presents with   Medicare Wellness    Subjective:   HPI: Jean Robinson is a 66 y.o. female presenting on 09/26/2021 for Medicare Wellness  The patient presents for annual medicare wellness, complete physical and review of chronic health problems. He/She also has the following acute concerns today: none   Following up with podiatry for Achilles tendonitis. Has follow up early next week.  I have personally reviewed the Medicare Annual Wellness questionnaire and have noted 1. The patient's medical and social history 2. Their use of alcohol, tobacco or illicit drugs 3. Their current medications and supplements 4. The patient's functional ability including ADL's, fall risks, home safety risks and hearing or visual             impairment. 5. Diet and physical activities 6. Evidence for depression or mood disorders 7.         Updated provider list. Cognitive evaluation was performed and recorded on pt medicare questionnaire form. The patients weight, height, BMI and visual acuity have been recorded in the chart   I have made referrals, counseling and provided education to the patient based review of the above and I have provided the pt with a written personalized care plan for preventive services.    Documentation of this information was scanned into the electronic record under the media tab.   Advance directives and end of life planning reviewed in detail with patient and documented in EMR. Patient given handout on advance care directives if needed. HCPOA and living will updated if needed.  No falls in last 12 months.  [place] Office Visit from 09/26/2021 in [place] at  [facility] Total Score 0      Hearing Screening  Method: Audiometry   _0  _1  _2  _3   Right ear _4 Left ear _5 Vision Screening - Comments:: Eye Exam with Dr. [place] on 06/02/2021   Diabetes: Inadequate control on metformin 500 mg XR 2 tablets twice daily, Lantus 45 units daily  She has been less active given Achilles issue.  No longer seeing Dr. Chalmers Cater ENDO.  Has short acting iunsulin but not using frequently.  Using continuous glucose monitor Lab Results  Component Value Date   HGBA1C 8.7 (H) 09/22/2021  Using medications without difficulties: Hypoglycemic episodes: frequently at night., 80-85, some 58.. treats with food then jumps to 260., even with bedtime snack  Hyperglycemic episodes:yes Feet problems: no ulcers Blood Sugars averaging: FBS 91-110... higher if has to eat early in AM, 2 hours after breakfast and dinner are good, 2 hr after healthy dinner   > 200s eye exam within last year: yes 06/2021  Elevated Cholesterol: LDL at goal on pravastatin 40 mg p.o. every other day Lab Results  Component Value Date   CHOL 154 09/22/2021   HDL 48.70 09/22/2021   LDLCALC 81 09/22/2021   TRIG 121.0 09/22/2021   CHOLHDL 3 09/22/2021  Using medications without problems: Muscle aches:  Diet compliance: Exercise: Other complaints:  Hypertension:  Well-controlled on lisinopril 40 mg p.o. daily BP Readings from Last 3 Encounters:  09/26/21 138/80  07/28/21 128/73  07/24/21 (!) 156/92  Using medication without problems or lightheadedness: none Chest pain with exertion: none Edema: some  in ight foot. Short of breath: none Average home BPs: Other issues:      Relevant past medical, surgical, family and social history reviewed and updated as indicated. Interim medical history since our last visit reviewed. Allergies and medications reviewed and updated. Outpatient Medications Prior to Visit  Medication Sig Dispense Refill   alendronate  (FOSAMAX) 70 MG tablet TAKE 1 TABLET (70 MG TOTAL) BY MOUTH ONCE A WEEK. 12 tablet 3   B-D ULTRAFINE III SHORT PEN 31G X 8 MM MISC USE 1 PEN AS DIRECTED 100 each 1   Blood Glucose Monitoring Suppl (ONETOUCH VERIO REFLECT) w/Device KIT Check blood sugar twice a day and as directed. Dx E11.65 1 kit 0   cetirizine (ZYRTEC) 10 MG tablet Take 1 tablet (10 mg total) by mouth daily. 30 tablet 11   Continuous Blood Gluc Receiver (FREESTYLE LIBRE 2 READER) DEVI Use to check blood sugar continuous due to low blood sugars at night.  Updated Rx 1 each 0   Continuous Blood Gluc Sensor (FREESTYLE LIBRE 2 SENSOR) MISC Use to check blood sugar continuous due to low blood sugars at night.  Updated Rx 2 each 11   fluticasone (FLONASE) 50 MCG/ACT nasal spray SPRAY 2 SPRAYS INTO EACH NOSTRIL EVERY DAY 48 mL 3   Insulin Aspart FlexPen 100 UNIT/ML SOPN Using sliding scale to determine mealtime dose SQ injection, max dose per ay 20 units 3 mL 11   insulin glargine (LANTUS SOLOSTAR) 100 UNIT/ML Solostar Pen INJECT 45 UNITS INTO THE SKIN AT 10 PM. IF FASTING BLOOD SUGAR >120 X2 DAYS, INCREASE BY 2 UNITS 15 mL 11   Lancet Devices (ONE TOUCH DELICA LANCING DEV) MISC Check blood sugar twice a day and as directed. Dx E11.65 1 each 0   latanoprost (XALATAN) 0.005 % ophthalmic solution Place 1 drop into both eyes at bedtime.      lisinopril (ZESTRIL) 40 MG tablet TAKE 1 TABLET BY MOUTH EVERY DAY 90 tablet 0   meloxicam (MOBIC) 7.5 MG tablet TAKE 1 TABLET BY MOUTH DAILY AS NEEDED FOR PAIN 30 tablet 0   metFORMIN (GLUCOPHAGE-XR) 500 MG 24 hr tablet TAKE 2 TABLETS BY MOUTH TWICE A DAY 360 tablet 0   nitroGLYCERIN (NITRODUR - DOSED IN MG/24 HR) 0.2 mg/hr patch PLACE 1 PATCH (0.2 MG TOTAL) ONTO THE SKIN DAILY. APPLY TO ACHILLES TENDON 30 patch 0   ONETOUCH VERIO test strip USE TO CHECK BLOOD SUGAR THREE TIMES DAILY. DX: E11.65 100 strip 11   pravastatin (PRAVACHOL) 40 MG tablet TAKE 1 TABLET BY MOUTH EVERY DAY 90 tablet 1   RESTASIS  0.05 % ophthalmic emulsion INSTILL 1 DROP INTO BOTH EYES TWICE A DAY INSTILL 1 DROP INTO BOTH EYES TWICE DAILY     sertraline (ZOLOFT) 25 MG tablet TAKE 1 TABLET BY MOUTH EVERY DAY 90 tablet 0   SYSTANE ULTRA 0.4-0.3 % SOLN SMARTSIG:1 Drop(s) In Eye(s) As Needed     valACYclovir (VALTREX) 500 MG tablet TAKE 1 TABLET BY MOUTH 2 TIMES DAILY, TAKE FOR 3 DAYS FOR ACUTE FLARE 180 tablet 2   doxycycline (VIBRA-TABS) 100 MG tablet Take 1 tablet (100 mg total) by mouth 2 (two) times daily. 28 tablet 0   fluconazole (DIFLUCAN) 150 MG tablet Take 1 tablet (150 mg  total) by mouth daily. Take 1 tablet after you finish the antibiotic and then if you need a second in 3 days, take a second tablet 2 tablet 0   ibuprofen (ADVIL) 600 MG tablet Take 1 tablet (600 mg total) by mouth every 6 (six) hours as needed. 30 tablet 0   No facility-administered medications prior to visit.     Per HPI unless specifically indicated in ROS section below Review of Systems  Constitutional:  Negative for fatigue and fever.  HENT:  Negative for congestion.   Eyes:  Negative for pain.  Respiratory:  Negative for cough and shortness of breath.   Cardiovascular:  Negative for chest pain, palpitations and leg swelling.  Gastrointestinal:  Negative for abdominal pain.  Genitourinary:  Negative for dysuria and vaginal bleeding.  Musculoskeletal:  Negative for back pain.  Neurological:  Negative for syncope, light-headedness and headaches.  Psychiatric/Behavioral:  Negative for dysphoric mood.    Objective:  BP 138/80   Pulse 84   Temp 97.8 F (36.6 C) (Oral)   Ht 5' 6.25" (1.683 m)   Wt 181 lb 6 oz (82.3 kg)   LMP 02/02/2006   SpO2 96%   BMI 29.05 kg/m   Wt Readings from Last 3 Encounters:  09/26/21 181 lb 6 oz (82.3 kg)  07/28/21 181 lb 12.8 oz (82.5 kg)  01/21/21 181 lb (82.1 kg)      Physical Exam Vitals and nursing note reviewed.  Constitutional:      General: She is not in acute distress.    Appearance:  Normal appearance. She is well-developed. She is not ill-appearing or toxic-appearing.  HENT:     Head: Normocephalic.     Right Ear: Hearing, tympanic membrane, ear canal and external ear normal.     Left Ear: Hearing, tympanic membrane, ear canal and external ear normal.     Nose: Nose normal.  Eyes:     General: Lids are normal. Lids are everted, no foreign bodies appreciated.     Conjunctiva/sclera: Conjunctivae normal.     Pupils: Pupils are equal, round, and reactive to light.  Neck:     Thyroid: No thyroid mass or thyromegaly.     Vascular: No carotid bruit.     Trachea: Trachea normal.  Cardiovascular:     Rate and Rhythm: Normal rate and regular rhythm.     Heart sounds: Normal heart sounds, S1 normal and S2 normal. No murmur heard.    No gallop.  Pulmonary:     Effort: Pulmonary effort is normal. No respiratory distress.     Breath sounds: Normal breath sounds. No wheezing, rhonchi or rales.  Abdominal:     General: Bowel sounds are normal. There is no distension or abdominal bruit.     Palpations: Abdomen is soft. There is no fluid wave or mass.     Tenderness: There is no abdominal tenderness. There is no guarding or rebound.     Hernia: No hernia is present.  Musculoskeletal:     Cervical back: Normal range of motion and neck supple.  Lymphadenopathy:     Cervical: No cervical adenopathy.  Skin:    General: Skin is warm and dry.     Findings: No rash.  Neurological:     Mental Status: She is alert.     Cranial Nerves: No cranial nerve deficit.     Sensory: No sensory deficit.  Psychiatric:        Mood and Affect: Mood is not anxious or depressed.  Speech: Speech normal.        Behavior: Behavior normal. Behavior is cooperative.        Judgment: Judgment normal.     Diabetic foot exam: Normal inspection No skin breakdown No calluses  Normal DP pulses Normal sensation to light touch and monofilament Nails normal     Results for orders placed or  performed in visit on 09/22/21  Comprehensive metabolic panel  Result Value Ref Range   Sodium 139 135 - 145 mEq/L   Potassium 4.3 3.5 - 5.1 mEq/L   Chloride 105 96 - 112 mEq/L   CO2 25 19 - 32 mEq/L   Glucose, Bld 89 70 - 99 mg/dL   BUN 18 6 - 23 mg/dL   Creatinine, Ser 0.88 0.40 - 1.20 mg/dL   Total Bilirubin 0.4 0.2 - 1.2 mg/dL   Alkaline Phosphatase 60 39 - 117 U/L   AST 15 0 - 37 U/L   ALT 14 0 - 35 U/L   Total Protein 6.7 6.0 - 8.3 g/dL   Albumin 4.0 3.5 - 5.2 g/dL   GFR 67.67 >60.00 mL/min   Calcium 10.2 8.4 - 10.5 mg/dL  Lipid panel  Result Value Ref Range   Cholesterol 154 0 - 200 mg/dL   Triglycerides 121.0 0.0 - 149.0 mg/dL   HDL 48.70 >39.00 mg/dL   VLDL 24.2 0.0 - 40.0 mg/dL   LDL Cholesterol 81 0 - 99 mg/dL   Total CHOL/HDL Ratio 3    NonHDL 105.39   Hemoglobin A1c  Result Value Ref Range   Hgb A1c MFr Bld 8.7 (H) 4.6 - 6.5 %     COVID 19 screen:  No recent travel or known exposure to COVID19 The patient denies respiratory symptoms of COVID 19 at this time. The importance of social distancing was discussed today.   Assessment and Plan The patient's preventative maintenance and recommended screening tests for an annual wellness exam were reviewed in full today. Brought up to date unless services declined.  Counselled on the importance of diet, exercise, and its role in overall health and mortality. The patient's FH and SH was reviewed, including their home life, tobacco status, and drug and alcohol status.   Vaccines:  S/P COVID x 3 Pap/DVE:  GYN Mammo: GYN 06/2020 Bone Density:  06/2020 osteoporosis BALAN.Marland Kitchen on fosamax.. Colon: 2009 repeat in 10 years. Cologuard 12/2019  Smoking Status: none ETOH/ drug VPX:TGGY/IRSW  Hep C: done  HIV screen:  refused   Problem List Items Addressed This Visit     Hyperlipidemia associated with type 2 diabetes mellitus ([place])    Stable, chronic.  Continue current medication.   LDL at goal on pravastatin 40 mg p.o.  daily      Relevant Medications   lisinopril (ZESTRIL) 40 MG tablet   Hypertension associated with diabetes ([facility])    Stable, chronic.  Continue current medication.   Lisinopril 40 mg daily      Relevant Medications   lisinopril (ZESTRIL) 40 MG tablet   Uncontrolled type 2 diabetes mellitus with hyperglycemia ([facility])    Chronic, poor control  Fluctuation from lows to highs.  Most commonly lows in the middle of the night even with bedtime snack.  Get back to exercise as able. If not able to walk.. do upper body exercises, weights etc. Lower Lantus to 40 Units in AM to stop feeding insulin.  Make sure to take sliding scale insulin at meals when indicated. Continue small heathy snack at bedtime. We will  consider Ozempic in the future when out of donut hole.      Relevant Medications   lisinopril (ZESTRIL) 40 MG tablet   Other Visit Diagnoses     Medicare annual wellness visit, subsequent    -  Primary   Herpes genitalis in women       Relevant Medications   valACYclovir (VALTREX) 500 MG tablet   Essential hypertension       Relevant Medications   lisinopril (ZESTRIL) 40 MG tablet       Eliezer Lofts, MD

## 2021-09-26 NOTE — Patient Instructions (Addendum)
Get back to exercise as able. If not able to walk.. do upper body exercises, weights etc. Lower Lantus to 40 Units in AM to stop feeding insulin.  Make sure to take sliding scale insulin at meals when indicated. Continue small heathy snack at bedtime. We will consider Ozempic in the future when out of donut hole.

## 2021-09-26 NOTE — Assessment & Plan Note (Signed)
Stable, chronic.  Continue current medication.   Lisinopril 40 mg daily

## 2021-09-26 NOTE — Assessment & Plan Note (Signed)
Stable, chronic.  Continue current medication.   LDL at goal on pravastatin 40 mg p.o. daily 

## 2021-09-29 ENCOUNTER — Ambulatory Visit (INDEPENDENT_AMBULATORY_CARE_PROVIDER_SITE_OTHER): Payer: HMO | Admitting: Podiatry

## 2021-09-29 DIAGNOSIS — M67971 Unspecified disorder of synovium and tendon, right ankle and foot: Secondary | ICD-10-CM

## 2021-09-29 NOTE — Progress Notes (Unsigned)
Subjective: States it was doing betteer with PT for the first 1-2 weeks but then they added exercises and it started getting worse. She noticed some brusing. She developed a rash form the nitro patch.    Denies any systemic complaints such as fevers, chills, nausea, vomiting. No acute changes since last appointment, and no other complaints at this time.   Objective: AAO x3, NAD DP/PT pulses palpable bilaterally, CRT less than 3 seconds Protective sensation intact with Simms Weinstein monofilament, vibratory sensation intact, Achilles tendon reflex intact No areas of pinpoint bony tenderness or pain with vibratory sensation. MMT 5/5, ROM WNL. No edema, erythema, increase in warmth to bilateral lower extremities.  No open lesions or pre-ulcerative lesions.  No pain with calf compression, swelling, warmth, erythema  Assessment:  Plan: -All treatment options discussed with the patient including all alternatives, risks, complications.  -continue PT, but bac kdown -continue nito path but move around -heel lift -Supportive shoe gear -MRI if needed -Patient encouraged to call the office with any questions, concerns, change in symptoms.

## 2021-10-03 DIAGNOSIS — R269 Unspecified abnormalities of gait and mobility: Secondary | ICD-10-CM | POA: Diagnosis not present

## 2021-10-03 DIAGNOSIS — M7661 Achilles tendinitis, right leg: Secondary | ICD-10-CM | POA: Diagnosis not present

## 2021-10-03 DIAGNOSIS — M25671 Stiffness of right ankle, not elsewhere classified: Secondary | ICD-10-CM | POA: Diagnosis not present

## 2021-10-03 DIAGNOSIS — M6281 Muscle weakness (generalized): Secondary | ICD-10-CM | POA: Diagnosis not present

## 2021-10-10 ENCOUNTER — Encounter: Payer: Self-pay | Admitting: Family Medicine

## 2021-10-10 ENCOUNTER — Ambulatory Visit (INDEPENDENT_AMBULATORY_CARE_PROVIDER_SITE_OTHER): Payer: HMO | Admitting: Family Medicine

## 2021-10-10 VITALS — BP 130/80 | HR 88 | Temp 98.0°F | Ht 66.25 in | Wt 185.1 lb

## 2021-10-10 DIAGNOSIS — I152 Hypertension secondary to endocrine disorders: Secondary | ICD-10-CM

## 2021-10-10 DIAGNOSIS — E1165 Type 2 diabetes mellitus with hyperglycemia: Secondary | ICD-10-CM | POA: Diagnosis not present

## 2021-10-10 DIAGNOSIS — I10 Essential (primary) hypertension: Secondary | ICD-10-CM | POA: Diagnosis not present

## 2021-10-10 DIAGNOSIS — Z23 Encounter for immunization: Secondary | ICD-10-CM

## 2021-10-10 DIAGNOSIS — E1159 Type 2 diabetes mellitus with other circulatory complications: Secondary | ICD-10-CM | POA: Diagnosis not present

## 2021-10-10 NOTE — Addendum Note (Signed)
Addended by: Carter Kitten on: 10/10/2021 09:39 AM   Modules accepted: Orders

## 2021-10-10 NOTE — Assessment & Plan Note (Signed)
Chronic, improved bedtime lows.  She does continue to have a few of these along with elevations after her largest meal of the day.  We will decrease her Lantus to 38 units daily and intensify her sliding scale insulin to the moderate scale.  In the future we may be able to use Ozempic for improved control as well as weight loss.  She is currently in the donut hole and cannot afford this.  We will recheck an A1c in 2 months but she will call with an update on blood sugars in 2 weeks.

## 2021-10-10 NOTE — Progress Notes (Signed)
Patient ID: Jean Robinson, female    DOB: September 28, 1955, 66 y.o.   MRN: [id number]  This visit was conducted in person.  BP 130/80   Pulse 88   Temp 98 F (36.7 C) (Oral)   Ht 5' 6.25" (1.683 m)   Wt 185 lb 2 oz (84 kg)   LMP 02/02/2006   SpO2 97%   BMI 29.65 kg/m    CC:  Chief Complaint  Patient presents with   Diabetes    Subjective:   HPI: Jean Robinson is a 66 y.o. female presenting on 10/10/2021 for Diabetes 2-week follow-up   At last office visit on September 26, 2021 we discussed her poor control of diabetes, with fluctuation from hypoglycemia to hyperglycemia.  She is no longer seeing Dr. Michiel Sites Endo. Lantus dose was lowered to 40 units in the morning to stop her from increasing her eating to avoid nighttime lows. Recommended working on being more consistent with taking sliding scale insulin at meals when indicated.  She was encouraged to continue healthy snack at bedtime. Lab Results  Component Value Date   HGBA1C 8.7 (H) 09/22/2021    Today she reports she has noted significant decrease in bedtime lows... only 2 < 85 in last 2 weeks. Eating hard boiled egg and apple for snack at night.  She is using a continuous blood sugar monitor.  FBS:  95-128, still having  207-270  around 8:45 PM    BP Readings from Last 3 Encounters:  10/10/21 130/80  09/26/21 138/80  07/28/21 128/73       Has been noting  BP elevation associated with headache this week. Using allergy med for possible allergy source.   159/88-164/83 trusted cuff. She has been using meloxicam 7.5 mg daily for achilles issue... now daily in last 2 weeks.  No decongestants.  Relevant past medical, surgical, family and social history reviewed and updated as indicated. Interim medical history since our last visit reviewed. Allergies and medications reviewed and updated. Outpatient Medications Prior to Visit  Medication Sig Dispense Refill   alendronate (FOSAMAX) 70 MG tablet TAKE 1 TABLET (70 MG TOTAL) BY  MOUTH ONCE A WEEK. 12 tablet 3   B-D ULTRAFINE III SHORT PEN 31G X 8 MM MISC USE 1 PEN AS DIRECTED 100 each 1   Blood Glucose Monitoring Suppl (ONETOUCH VERIO REFLECT) w/Device KIT Check blood sugar twice a day and as directed. Dx E11.65 1 kit 0   cetirizine (ZYRTEC) 10 MG tablet Take 1 tablet (10 mg total) by mouth daily. 30 tablet 11   Continuous Blood Gluc Receiver (FREESTYLE LIBRE 2 READER) DEVI Use to check blood sugar continuous due to low blood sugars at night.  Updated Rx 1 each 0   Continuous Blood Gluc Sensor (FREESTYLE LIBRE 2 SENSOR) MISC Use to check blood sugar continuous due to low blood sugars at night.  Updated Rx 2 each 11   fluticasone (FLONASE) 50 MCG/ACT nasal spray SPRAY 2 SPRAYS INTO EACH NOSTRIL EVERY DAY 48 mL 3   Insulin Aspart FlexPen 100 UNIT/ML SOPN Using sliding scale to determine mealtime dose SQ injection, max dose per ay 20 units 3 mL 11   insulin glargine (LANTUS SOLOSTAR) 100 UNIT/ML Solostar Pen INJECT 45 UNITS INTO THE SKIN AT 10 PM. IF FASTING BLOOD SUGAR >120 X2 DAYS, INCREASE BY 2 UNITS 15 mL 11   Lancet Devices (ONE TOUCH DELICA LANCING DEV) MISC Check blood sugar twice a day and as directed. Dx E11.65 1  each 0   latanoprost (XALATAN) 0.005 % ophthalmic solution Place 1 drop into both eyes at bedtime.      lisinopril (ZESTRIL) 40 MG tablet Take 1 tablet (40 mg total) by mouth daily. 90 tablet 3   meloxicam (MOBIC) 7.5 MG tablet TAKE 1 TABLET BY MOUTH DAILY AS NEEDED FOR PAIN 30 tablet 0   metFORMIN (GLUCOPHAGE-XR) 500 MG 24 hr tablet TAKE 2 TABLETS BY MOUTH TWICE A DAY 360 tablet 0   nitroGLYCERIN (NITRODUR - DOSED IN MG/24 HR) 0.2 mg/hr patch PLACE 1 PATCH (0.2 MG TOTAL) ONTO THE SKIN DAILY. APPLY TO ACHILLES TENDON 30 patch 0   ONETOUCH VERIO test strip USE TO CHECK BLOOD SUGAR THREE TIMES DAILY. DX: E11.65 100 strip 11   pravastatin (PRAVACHOL) 40 MG tablet TAKE 1 TABLET BY MOUTH EVERY DAY 90 tablet 1   RESTASIS 0.05 % ophthalmic emulsion INSTILL 1 DROP  INTO BOTH EYES TWICE A DAY INSTILL 1 DROP INTO BOTH EYES TWICE DAILY     sertraline (ZOLOFT) 25 MG tablet Take 1 tablet (25 mg total) by mouth daily. 90 tablet 1   SYSTANE ULTRA 0.4-0.3 % SOLN SMARTSIG:1 Drop(s) In Eye(s) As Needed     valACYclovir (VALTREX) 500 MG tablet TAKE 1 TABLET BY MOUTH 2 TIMES DAILY, TAKE FOR 3 DAYS FOR ACUTE FLARE 60 tablet 2   No facility-administered medications prior to visit.     Per HPI unless specifically indicated in ROS section below Review of Systems  Constitutional:  Negative for fatigue and fever.  HENT:  Negative for congestion.   Eyes:  Negative for pain.  Respiratory:  Negative for cough and shortness of breath.   Cardiovascular:  Negative for chest pain, palpitations and leg swelling.  Gastrointestinal:  Negative for abdominal pain.  Genitourinary:  Negative for dysuria and vaginal bleeding.  Musculoskeletal:  Negative for back pain.  Neurological:  Negative for syncope, light-headedness and headaches.  Psychiatric/Behavioral:  Negative for dysphoric mood.    Objective:  BP 130/80   Pulse 88   Temp 98 F (36.7 C) (Oral)   Ht 5' 6.25" (1.683 m)   Wt 185 lb 2 oz (84 kg)   LMP 02/02/2006   SpO2 97%   BMI 29.65 kg/m   Wt Readings from Last 3 Encounters:  10/10/21 185 lb 2 oz (84 kg)  09/26/21 181 lb 6 oz (82.3 kg)  07/28/21 181 lb 12.8 oz (82.5 kg)      Physical Exam Constitutional:      General: She is not in acute distress.    Appearance: Normal appearance. She is well-developed. She is not ill-appearing or toxic-appearing.  HENT:     Head: Normocephalic.     Right Ear: Hearing, tympanic membrane, ear canal and external ear normal. Tympanic membrane is not erythematous, retracted or bulging.     Left Ear: Hearing, tympanic membrane, ear canal and external ear normal. Tympanic membrane is not erythematous, retracted or bulging.     Nose: No mucosal edema or rhinorrhea.     Right Sinus: No maxillary sinus tenderness or frontal sinus  tenderness.     Left Sinus: No maxillary sinus tenderness or frontal sinus tenderness.     Mouth/Throat:     Pharynx: Uvula midline.  Eyes:     General: Lids are normal. Lids are everted, no foreign bodies appreciated.     Conjunctiva/sclera: Conjunctivae normal.     Pupils: Pupils are equal, round, and reactive to light.  Neck:  Thyroid: No thyroid mass or thyromegaly.     Vascular: No carotid bruit.     Trachea: Trachea normal.  Cardiovascular:     Rate and Rhythm: Normal rate and regular rhythm.     Pulses: Normal pulses.     Heart sounds: Normal heart sounds, S1 normal and S2 normal. No murmur heard.    No friction rub. No gallop.  Pulmonary:     Effort: Pulmonary effort is normal. No tachypnea or respiratory distress.     Breath sounds: Normal breath sounds. No decreased breath sounds, wheezing, rhonchi or rales.  Abdominal:     General: Bowel sounds are normal.     Palpations: Abdomen is soft.     Tenderness: There is no abdominal tenderness.  Musculoskeletal:     Cervical back: Normal range of motion and neck supple.  Skin:    General: Skin is warm and dry.     Findings: No rash.  Neurological:     Mental Status: She is alert.  Psychiatric:        Mood and Affect: Mood is not anxious or depressed.        Speech: Speech normal.        Behavior: Behavior normal. Behavior is cooperative.        Thought Content: Thought content normal.        Judgment: Judgment normal.       Results for orders placed or performed in visit on 09/26/21  HM DIABETES FOOT EXAM  Result Value Ref Range   HM Diabetic Foot Exam done      COVID 19 screen:  No recent travel or known exposure to COVID19 The patient denies respiratory symptoms of COVID 19 at this time. The importance of social distancing was discussed today.   Assessment and Plan Problem List Items Addressed This Visit     Hypertension associated with diabetes ([place])    Chronic, recent elevations intermittently at  home.  She will continue to follow.  Continue lisinopril 40 mg p.o. daily. Stop meloxicam and change to topical Voltaren gel for ankle if able given the meloxicam may be elevating blood pressure.      Uncontrolled type 2 diabetes mellitus with hyperglycemia ([facility]) - Primary    Chronic, improved bedtime lows.  She does continue to have a few of these along with elevations after her largest meal of the day.  We will decrease her Lantus to 38 units daily and intensify her sliding scale insulin to the moderate scale.  In the future we may be able to use Ozempic for improved control as well as weight loss.  She is currently in the donut hole and cannot afford this.  We will recheck an A1c in 2 months but she will call with an update on blood sugars in 2 weeks.      Other Visit Diagnoses     Need for influenza vaccination       Relevant Orders   Flu Vaccine QUAD High Dose(Fluad) (Completed)          Eliezer Lofts, MD

## 2021-10-10 NOTE — Assessment & Plan Note (Signed)
Chronic, recent elevations intermittently at home.  She will continue to follow.  Continue lisinopril 40 mg p.o. daily. Stop meloxicam and change to topical Voltaren gel for ankle if able given the meloxicam may be elevating blood pressure.

## 2021-10-10 NOTE — Assessment & Plan Note (Signed)
Chronic, poor control  Fluctuation from lows to highs.  Most commonly lows in the middle of the night even with bedtime snack.  Get back to exercise as able. If not able to walk.. do upper body exercises, weights etc. Lower Lantus to 40 Units in AM to stop feeding insulin.  Make sure to take sliding scale insulin at meals when indicated. Continue small heathy snack at bedtime. We will consider Ozempic in the future when out of donut hole.

## 2021-10-10 NOTE — Patient Instructions (Addendum)
Decrease bedtime alarm to have low set at 80.  Continue healthy bedtime snack.  Decrease Lantus to 38 units.  Change sliding scale to moderate strength.  Stop Meloxicam... use Voltaren instead for pain.  Check blood pressure daily. Update me with blood sugars on MyChart in 2-4 weeks.

## 2021-10-18 ENCOUNTER — Other Ambulatory Visit: Payer: Self-pay | Admitting: Family Medicine

## 2021-10-20 ENCOUNTER — Other Ambulatory Visit: Payer: Self-pay | Admitting: Podiatry

## 2021-10-20 DIAGNOSIS — H401131 Primary open-angle glaucoma, bilateral, mild stage: Secondary | ICD-10-CM | POA: Diagnosis not present

## 2021-10-30 ENCOUNTER — Other Ambulatory Visit: Payer: Self-pay | Admitting: Family Medicine

## 2021-10-30 DIAGNOSIS — E1165 Type 2 diabetes mellitus with hyperglycemia: Secondary | ICD-10-CM

## 2021-10-30 DIAGNOSIS — E162 Hypoglycemia, unspecified: Secondary | ICD-10-CM

## 2021-11-19 ENCOUNTER — Other Ambulatory Visit: Payer: Self-pay | Admitting: Family Medicine

## 2021-12-01 ENCOUNTER — Ambulatory Visit: Payer: HMO | Admitting: Podiatry

## 2021-12-01 DIAGNOSIS — M67971 Unspecified disorder of synovium and tendon, right ankle and foot: Secondary | ICD-10-CM

## 2021-12-01 MED ORDER — NITROGLYCERIN 0.2 MG/HR TD PT24: 0.2000 mg | MEDICATED_PATCH | Freq: Every day | TRANSDERMAL | 0 refills | Status: AC

## 2021-12-01 NOTE — Progress Notes (Unsigned)
Subjective: Chief Complaint  Patient presents with   Foot Pain    Right foot achilles tendon, doing much better, able to stand on the foot for long time periods     66 year old female with the above complaints. She states she is doing much better. It has not completely resolved but much improved. The swelling is improved.  She is able to be on her feet for longer period of time.  No recent injury or changes and no new concerns.  Objective: AAO x3, NAD DP/PT pulses palpable bilaterally, CRT less than 3 seconds There is slight discomfort upon palpation along mid substance the Achilles tendon on the right side there is also mild hypertrophy present but overall appears to be much improved.  Thompson test is negative the Achilles tendon appears to be intact.  MMT 5/5. No pain with calf compression, swelling, warmth, erythema  Assessment: Tendinitis right side with improvement  Plan: -All treatment options discussed with the patient including all alternatives, risks, complications.  -Overall she is clinically doing much better.  Still some thickening of the tendon.  As she is improving we will hold off on MRI but encouraged her to continue with good supportive shoe gear, stretching, rehab exercises as well as icing.  She is also using the nitro patches which has been helpful.  Return if symptoms worsen or fail to improve.  Trula Slade DPM

## 2021-12-01 NOTE — Patient Instructions (Signed)

## 2021-12-08 ENCOUNTER — Other Ambulatory Visit: Payer: Self-pay | Admitting: Family Medicine

## 2021-12-11 ENCOUNTER — Other Ambulatory Visit: Payer: Self-pay | Admitting: Family Medicine

## 2021-12-25 ENCOUNTER — Other Ambulatory Visit: Payer: Self-pay | Admitting: Family Medicine

## 2021-12-25 DIAGNOSIS — A6009 Herpesviral infection of other urogenital tract: Secondary | ICD-10-CM

## 2021-12-28 NOTE — Telephone Encounter (Signed)
Last office visit 10/10/21 for DM.  Last refilled 09/26/21 for #60 with 2 refills.  Next Appt: 01/20/22 for DM.

## 2021-12-29 ENCOUNTER — Ambulatory Visit: Payer: HMO | Admitting: Family Medicine

## 2021-12-30 ENCOUNTER — Ambulatory Visit: Payer: HMO | Admitting: Family Medicine

## 2022-01-20 ENCOUNTER — Ambulatory Visit: Payer: HMO | Admitting: Family Medicine

## 2022-02-03 ENCOUNTER — Telehealth: Payer: Self-pay | Admitting: Family Medicine

## 2022-02-03 DIAGNOSIS — E1165 Type 2 diabetes mellitus with hyperglycemia: Secondary | ICD-10-CM

## 2022-02-03 NOTE — Telephone Encounter (Signed)
-----   Message from Velna Hatchet, RT sent at 01/19/2022  4:16 PM EST ----- Regarding: Fri 1/5 lab Patient has a labs only appt on 02/06/22.  Need future lab orders, please.  Patient has an appt on 02/13/22 that says:  DM follow up with POC A1c.  Thanks, Anda Kraft

## 2022-02-06 ENCOUNTER — Other Ambulatory Visit (INDEPENDENT_AMBULATORY_CARE_PROVIDER_SITE_OTHER): Payer: PPO

## 2022-02-06 DIAGNOSIS — E1165 Type 2 diabetes mellitus with hyperglycemia: Secondary | ICD-10-CM | POA: Diagnosis not present

## 2022-02-06 LAB — COMPREHENSIVE METABOLIC PANEL
ALT: 14 U/L (ref 0–35)
AST: 15 U/L (ref 0–37)
Albumin: 4.3 g/dL (ref 3.5–5.2)
Alkaline Phosphatase: 64 U/L (ref 39–117)
BUN: 15 mg/dL (ref 6–23)
CO2: 28 mEq/L (ref 19–32)
Calcium: 10.6 mg/dL — ABNORMAL HIGH (ref 8.4–10.5)
Chloride: 103 mEq/L (ref 96–112)
Creatinine, Ser: 0.9 mg/dL (ref 0.40–1.20)
GFR: 65.7 mL/min (ref >60.00)
Glucose, Bld: 108 mg/dL — ABNORMAL HIGH (ref 70–99)
Potassium: 4.2 mEq/L (ref 3.5–5.1)
Sodium: 140 mEq/L (ref 135–145)
Total Bilirubin: 0.4 mg/dL (ref 0.2–1.2)
Total Protein: 6.8 g/dL (ref 6.0–8.3)

## 2022-02-06 LAB — LIPID PANEL
Cholesterol: 172 mg/dL (ref 0–200)
HDL: 54.6 mg/dL (ref >39.00)
LDL Cholesterol: 99 mg/dL (ref 0–99)
NonHDL: 117.63
Total CHOL/HDL Ratio: 3
Triglycerides: 93 mg/dL (ref 0.0–149.0)
VLDL: 18.6 mg/dL (ref 0.0–40.0)

## 2022-02-06 LAB — HEMOGLOBIN A1C: Hgb A1c MFr Bld: 9.4 % — ABNORMAL HIGH (ref 4.6–6.5)

## 2022-02-06 LAB — MICROALBUMIN / CREATININE URINE RATIO
Creatinine,U: 123.6 mg/dL
Microalb Creat Ratio: 1.3 mg/g (ref 0.0–30.0)
Microalb, Ur: 1.6 mg/dL (ref 0.0–1.9)

## 2022-02-06 NOTE — Progress Notes (Signed)
No critical labs need to be addressed urgently. We will discuss labs in detail at upcoming office visit.   

## 2022-02-13 ENCOUNTER — Ambulatory Visit (INDEPENDENT_AMBULATORY_CARE_PROVIDER_SITE_OTHER): Payer: PPO | Admitting: Family Medicine

## 2022-02-13 ENCOUNTER — Encounter: Payer: Self-pay | Admitting: Family Medicine

## 2022-02-13 VITALS — BP 133/74 | HR 96 | Temp 98.2°F | Ht 66.25 in | Wt 182.2 lb

## 2022-02-13 DIAGNOSIS — I152 Hypertension secondary to endocrine disorders: Secondary | ICD-10-CM

## 2022-02-13 DIAGNOSIS — E1169 Type 2 diabetes mellitus with other specified complication: Secondary | ICD-10-CM

## 2022-02-13 DIAGNOSIS — E213 Hyperparathyroidism, unspecified: Secondary | ICD-10-CM | POA: Diagnosis not present

## 2022-02-13 DIAGNOSIS — E785 Hyperlipidemia, unspecified: Secondary | ICD-10-CM

## 2022-02-13 DIAGNOSIS — R7989 Other specified abnormal findings of blood chemistry: Secondary | ICD-10-CM | POA: Diagnosis not present

## 2022-02-13 DIAGNOSIS — E1165 Type 2 diabetes mellitus with hyperglycemia: Secondary | ICD-10-CM

## 2022-02-13 DIAGNOSIS — M545 Low back pain, unspecified: Secondary | ICD-10-CM | POA: Diagnosis not present

## 2022-02-13 DIAGNOSIS — E1159 Type 2 diabetes mellitus with other circulatory complications: Secondary | ICD-10-CM

## 2022-02-13 MED ORDER — DICLOFENAC SODIUM 75 MG PO TBEC: 75.0000 mg | DELAYED_RELEASE_TABLET | Freq: Two times a day (BID) | ORAL | 0 refills | Status: DC

## 2022-02-13 MED ORDER — OZEMPIC (0.25 OR 0.5 MG/DOSE) 2 MG/3ML ~~LOC~~ SOPN: PEN_INJECTOR | SUBCUTANEOUS | 0 refills | Status: DC

## 2022-02-13 NOTE — Assessment & Plan Note (Signed)
Stable, chronic.  Continue current medication.   LDL at goal on pravastatin 40 mg p.o. daily

## 2022-02-13 NOTE — Assessment & Plan Note (Signed)
Chronic, poorly controlled, worsened control in the last 3 months  She is on Lantus 38 units daily along with sliding scale insulin and metformin XR 500 mg 2 tablets twice daily. We will start Ozempic at 0.25 mg weekly.  She will follow-up in 2 to 4 weeks for likely increase in dose, with overall goal of improving blood sugar control, decreasing her other medications especially NovoLog (no longer available) and weight loss.

## 2022-02-13 NOTE — Assessment & Plan Note (Signed)
Acute No focal vertebral fracture, no indication for x-ray. Will start with home physical therapy diclofenac 75 mg twice daily, heat and massage. She will return in 2 to 4 weeks for reassessment.  If not improving we can move forward with formal physical therapy and possible x-ray.

## 2022-02-13 NOTE — Progress Notes (Signed)
Patient ID: Jean Robinson, female    DOB: [birth year], 67 y.o.   MRN: [id number]  This visit was conducted in person.  BP 133/74 (BP Location: Left Arm, Cuff Size: Normal) Comment: Patient's Home BP machine  Pulse 96   Temp 98.2 F (36.8 C) (Oral)   Ht 5' 6.25" (1.683 m)   Wt 182 lb 4 oz (82.7 kg)   LMP 02/02/2006   SpO2 97%   BMI 29.19 kg/m    CC:  Chief Complaint  Patient presents with   Diabetes   Back Pain    Subjective:   HPI: Jean Robinson is a 67 y.o. female presenting on 02/13/2022 for Diabetes and Back Pain   Diabetes: At last office visit Lantus was decreased to 38 units daily and sliding scale insulin was intensified to moderate scale.  worsening control of A1c up from 8.7-9.4 today. On Lantus 38 units daily, sliding scale insulin and metformin XR.  She is interested in starting ozempic. Lab Results  Component Value Date   HGBA1C 9.4 (H) 02/06/2022  Using medications without difficulties: Hypoglycemic episodes:   rare Hyperglycemic episodes:none Feet problems: none Blood Sugars averaging: continuous blood glucose meter eye exam within last year:  Elevated Cholesterol: LDL at goal less than 70 on pravastatin 40 mg daily Lab Results  Component Value Date   CHOL 172 02/06/2022   HDL 54.60 02/06/2022   LDLCALC 99 02/06/2022   TRIG 93.0 02/06/2022   CHOLHDL 3 02/06/2022    Using medications without problems: Muscle aches:  Diet compliance: Exercise: Other complaints:  Hypertension:   BP Readings from Last 3 Encounters:  02/13/22 133/74  10/10/21 130/80  09/26/21 138/80     Using medication without problems or lightheadedness:  Chest pain with exertion: Edema: Short of breath: Average home BPs: Other issues:  Acute back pain, new onset in last 6 weeks.  Gradually worsening.  Pain in right lower back   Increased pain with sitting to standing,keeps her up at night.  No radiation of pain to legs, no weakness, no numbness in legs. No fever. No  perineal numbness.  No known injury, no fall. Taking tylenol 500 mg 1 three times a day, salonpas patches, heat.     Relevant past medical, surgical, family and social history reviewed and updated as indicated. Interim medical history since our last visit reviewed. Allergies and medications reviewed and updated. Outpatient Medications Prior to Visit  Medication Sig Dispense Refill   alendronate (FOSAMAX) 70 MG tablet TAKE 1 TABLET (70 MG TOTAL) BY MOUTH ONCE A WEEK. 12 tablet 3   B-D ULTRAFINE III SHORT PEN 31G X 8 MM MISC USE 1 PEN AS DIRECTED 100 each 1   Blood Glucose Monitoring Suppl (ONETOUCH VERIO REFLECT) w/Device KIT Check blood sugar twice a day and as directed. Dx E11.65 1 kit 0   cetirizine (ZYRTEC) 10 MG tablet Take 1 tablet (10 mg total) by mouth daily. 30 tablet 11   Continuous Blood Gluc Receiver (FREESTYLE LIBRE 2 READER) DEVI Use to check blood sugar continuous due to low blood sugars at night.  Updated Rx 1 each 0   Continuous Blood Gluc Sensor (FREESTYLE LIBRE 2 SENSOR) MISC USE TO CHECK BLOOD SUGAR CONTINUOUS DUE TO LOW BLOOD SUGARS AT NIGHT. UPDATED RX 2 each 11   fluticasone (FLONASE) 50 MCG/ACT nasal spray SPRAY 2 SPRAYS INTO EACH NOSTRIL EVERY DAY 48 mL 3   insulin aspart (NOVOLOG FLEXPEN) 100 UNIT/ML FlexPen USING SLIDING SCALE TO DETERMINE  MEALTIME DOSE SUBCUTANEOUS INJECTION, MAX DOSE PER AY 20 UNITS 15 mL 3   insulin glargine (LANTUS SOLOSTAR) 100 UNIT/ML Solostar Pen Inject 38 Units into the skin daily. 15 mL 11   Lancet Devices (ONE TOUCH DELICA LANCING DEV) MISC Check blood sugar twice a day and as directed. Dx E11.65 1 each 0   latanoprost (XALATAN) 0.005 % ophthalmic solution Place 1 drop into both eyes at bedtime.      lisinopril (ZESTRIL) 40 MG tablet Take 1 tablet (40 mg total) by mouth daily. 90 tablet 3   metFORMIN (GLUCOPHAGE-XR) 500 MG 24 hr tablet TAKE 2 TABLETS BY MOUTH TWICE A DAY 360 tablet 0   nitroGLYCERIN (NITRODUR - DOSED IN MG/24 HR) 0.2 mg/hr  patch Place 1 patch (0.2 mg total) onto the skin daily. Apply to Achilles tendon 30 patch 0   ONETOUCH VERIO test strip USE TO CHECK BLOOD SUGAR THREE TIMES DAILY. DX: E11.65 100 strip 11   pravastatin (PRAVACHOL) 40 MG tablet TAKE 1 TABLET BY MOUTH EVERY DAY 90 tablet 3   sertraline (ZOLOFT) 25 MG tablet Take 1 tablet (25 mg total) by mouth daily. 90 tablet 1   SYSTANE ULTRA 0.4-0.3 % SOLN SMARTSIG:1 Drop(s) In Eye(s) As Needed     valACYclovir (VALTREX) 500 MG tablet TAKE 1 TABLET BY MOUTH 2 TIMES DAILY, TAKE FOR 3 DAYS FOR ACUTE FLARE 180 tablet 0   RESTASIS 0.05 % ophthalmic emulsion INSTILL 1 DROP INTO BOTH EYES TWICE A DAY INSTILL 1 DROP INTO BOTH EYES TWICE DAILY     meloxicam (MOBIC) 7.5 MG tablet TAKE 1 TABLET BY MOUTH DAILY AS NEEDED FOR PAIN (Patient not taking: Reported on 02/13/2022) 30 tablet 0   No facility-administered medications prior to visit.     Per HPI unless specifically indicated in ROS section below Review of Systems  Constitutional:  Negative for fatigue and fever.  HENT:  Negative for ear pain.   Eyes:  Negative for pain.  Respiratory:  Negative for chest tightness and shortness of breath.   Cardiovascular:  Negative for chest pain, palpitations and leg swelling.  Gastrointestinal:  Negative for abdominal pain.  Genitourinary:  Negative for dysuria.   Objective:  BP 133/74 (BP Location: Left Arm, Cuff Size: Normal) Comment: Patient's Home BP machine  Pulse 96   Temp 98.2 F (36.8 C) (Oral)   Ht 5' 6.25" (1.683 m)   Wt 182 lb 4 oz (82.7 kg)   LMP 02/02/2006   SpO2 97%   BMI 29.19 kg/m   Wt Readings from Last 3 Encounters:  02/13/22 182 lb 4 oz (82.7 kg)  10/10/21 185 lb 2 oz (84 kg)  09/26/21 181 lb 6 oz (82.3 kg)      Physical Exam Constitutional:      General: She is not in acute distress.    Appearance: Normal appearance. She is well-developed. She is not ill-appearing or toxic-appearing.  HENT:     Head: Normocephalic.     Right Ear: Hearing,  tympanic membrane, ear canal and external ear normal. Tympanic membrane is not erythematous, retracted or bulging.     Left Ear: Hearing, tympanic membrane, ear canal and external ear normal. Tympanic membrane is not erythematous, retracted or bulging.     Nose: No mucosal edema or rhinorrhea.     Right Sinus: No maxillary sinus tenderness or frontal sinus tenderness.     Left Sinus: No maxillary sinus tenderness or frontal sinus tenderness.     Mouth/Throat:  Pharynx: Uvula midline.  Eyes:     General: Lids are normal. Lids are everted, no foreign bodies appreciated.     Conjunctiva/sclera: Conjunctivae normal.     Pupils: Pupils are equal, round, and reactive to light.  Neck:     Thyroid: No thyroid mass or thyromegaly.     Vascular: No carotid bruit.     Trachea: Trachea normal.  Cardiovascular:     Rate and Rhythm: Normal rate and regular rhythm.     Pulses: Normal pulses.     Heart sounds: Normal heart sounds, S1 normal and S2 normal. No murmur heard.    No friction rub. No gallop.  Pulmonary:     Effort: Pulmonary effort is normal. No tachypnea or respiratory distress.     Breath sounds: Normal breath sounds. No decreased breath sounds, wheezing, rhonchi or rales.  Abdominal:     General: Bowel sounds are normal.     Palpations: Abdomen is soft.     Tenderness: There is no abdominal tenderness.  Musculoskeletal:     Cervical back: Normal range of motion and neck supple.     Lumbar back: Tenderness present. No bony tenderness. Decreased range of motion. Positive right straight leg raise test. Negative left straight leg raise test.  Skin:    General: Skin is warm and dry.     Findings: No rash.  Neurological:     Mental Status: She is alert.  Psychiatric:        Mood and Affect: Mood is not anxious or depressed.        Speech: Speech normal.        Behavior: Behavior normal. Behavior is cooperative.        Thought Content: Thought content normal.        Judgment:  Judgment normal.       Results for orders placed or performed in visit on 02/06/22  Comprehensive metabolic panel  Result Value Ref Range   Sodium 140 135 - 145 mEq/L   Potassium 4.2 3.5 - 5.1 mEq/L   Chloride 103 96 - 112 mEq/L   CO2 28 19 - 32 mEq/L   Glucose, Bld 108 (H) 70 - 99 mg/dL   BUN 15 6 - 23 mg/dL   Creatinine, Ser 0.90 0.40 - 1.20 mg/dL   Total Bilirubin 0.4 0.2 - 1.2 mg/dL   Alkaline Phosphatase 64 39 - 117 U/L   AST 15 0 - 37 U/L   ALT 14 0 - 35 U/L   Total Protein 6.8 6.0 - 8.3 g/dL   Albumin 4.3 3.5 - 5.2 g/dL   GFR 65.70 >60.00 mL/min   Calcium 10.6 (H) 8.4 - 10.5 mg/dL  Microalbumin / creatinine urine ratio  Result Value Ref Range   Microalb, Ur 1.6 0.0 - 1.9 mg/dL   Creatinine,U 123.6 mg/dL   Microalb Creat Ratio 1.3 0.0 - 30.0 mg/g  Lipid panel  Result Value Ref Range   Cholesterol 172 0 - 200 mg/dL   Triglycerides 93.0 0.0 - 149.0 mg/dL   HDL 54.60 >39.00 mg/dL   VLDL 18.6 0.0 - 40.0 mg/dL   LDL Cholesterol 99 0 - 99 mg/dL   Total CHOL/HDL Ratio 3    NonHDL 117.63   Hemoglobin A1c  Result Value Ref Range   Hgb A1c MFr Bld 9.4 (H) 4.6 - 6.5 %    Assessment and Plan  Uncontrolled type 2 diabetes mellitus with hyperglycemia (HCC) Assessment & Plan: Chronic, poorly controlled, worsened control in the last  3 months  She is on Lantus 38 units daily along with sliding scale insulin and metformin XR 500 mg 2 tablets twice daily. We will start Ozempic at 0.25 mg weekly.  She will follow-up in 2 to 4 weeks for likely increase in dose, with overall goal of improving blood sugar control, decreasing her other medications especially NovoLog (no longer available) and weight loss.  Orders: -     Ozempic (0.25 or 0.5 MG/DOSE); 0.25 mg weekly  Dispense: 3 mL; Refill: 0 -     Hemoglobin A1c; Future  Acute right-sided low back pain without sciatica Assessment & Plan: Acute No focal vertebral fracture, no indication for x-ray. Will start with home physical  therapy diclofenac 75 mg twice daily, heat and massage. She will return in 2 to 4 weeks for reassessment.  If not improving we can move forward with formal physical therapy and possible x-ray.   Hypertension associated with diabetes [facility]) Assessment & Plan: Chronic, now well-controlled, off meloxicam.    Continue lisinopril 40 mg p.o. daily. Of note we are starting a different NSAID for her low back pain issues, we will continue to monitor her blood pressure closely to make sure it does not go up with this medication.   Hyperlipidemia associated with type 2 diabetes mellitus ([place]) Assessment & Plan: Stable, chronic.  Continue current medication.   LDL at goal on pravastatin 40 mg p.o. daily   Hyperparathyroidism [facility]) Assessment & Plan: Has seen endocrinology in the past.  Of note her calcium is again slightly elevated but only at 10.6. She reports that discussion with Dr. Michiel Sites in the past for treatment of primary hyperparathyroidism suggested if it needed to be treated she would have to have a parathyroidectomy. We discussed possible bone density issues with untreated hyperparathyroidism.  She has no focal vertebral pain with her back pain today but we may need to do an x-ray to rule out compression fracture if she is not improving with initial treatment.  She has had bone density in 2022 showing mild osteopenia.  She is currently on Fosamax 70 mg weekly.  At this point it seems fairly well-controlled so we will hold off on any further evaluation but will consider rechecking it as well as parathyroid at next lab draw.  Orders: -     Comprehensive metabolic panel; Future -     PTH, intact and calcium; Future  High serum calcium Assessment & Plan: Due to primary hyperparathyroidism.   Other orders -     Diclofenac Sodium; Take 1 tablet (75 mg total) by mouth 2 (two) times daily.  Dispense: 30 tablet; Refill: 0    Return in about 4 weeks (around 03/13/2022) for  back pain,   diabetes and weight management 40 min OV please.   Eliezer Lofts, MD

## 2022-02-13 NOTE — Assessment & Plan Note (Signed)
Due to primary hyperparathyroidism.

## 2022-02-13 NOTE — Assessment & Plan Note (Addendum)
Has seen endocrinology in the past.  Of note her calcium is again slightly elevated but only at 10.6. She reports that discussion with Dr. Michiel Sites in the past for treatment of primary hyperparathyroidism suggested if it needed to be treated she would have to have a parathyroidectomy. We discussed possible bone density issues with untreated hyperparathyroidism.  She has no focal vertebral pain with her back pain today but we may need to do an x-ray to rule out compression fracture if she is not improving with initial treatment.  She has had bone density in 2022 showing mild osteopenia.  She is currently on Fosamax 70 mg weekly.  At this point it seems fairly well-controlled so we will hold off on any further evaluation but will consider rechecking it as well as parathyroid at next lab draw.

## 2022-02-13 NOTE — Assessment & Plan Note (Signed)
Chronic, now well-controlled, off meloxicam.    Continue lisinopril 40 mg p.o. daily. Of note we are starting a different NSAID for her low back pain issues, we will continue to monitor her blood pressure closely to make sure it does not go up with this medication.

## 2022-02-13 NOTE — Patient Instructions (Addendum)
Start ozempic 0.25 mg weekly.  Follow blood sugars at home.  Continue current Lantus, sliding scale and metformin.  Start  diclofenac  twice daily  Start home PT.  Heat and massage. Call if severe  pain in back .

## 2022-02-16 ENCOUNTER — Other Ambulatory Visit (HOSPITAL_COMMUNITY): Payer: Self-pay

## 2022-02-17 NOTE — Telephone Encounter (Signed)
Message has been sent to [employer].

## 2022-02-17 NOTE — Telephone Encounter (Signed)
Pharmacy Patient Advocate Encounter  Prior Authorization for Ozempic '2mg'$ /47m has been approved.     Effective dates: 02/16/2022 through 02/17/2023  Spoke with Pharmacy to process.

## 2022-02-17 NOTE — Telephone Encounter (Signed)
Patient called and stated she is still waiting on the Ozempic to go through and she has contacted the pharmacy. Call back number [phone].

## 2022-02-18 ENCOUNTER — Other Ambulatory Visit: Payer: Self-pay | Admitting: Family Medicine

## 2022-02-18 DIAGNOSIS — H401131 Primary open-angle glaucoma, bilateral, mild stage: Secondary | ICD-10-CM | POA: Diagnosis not present

## 2022-02-27 ENCOUNTER — Telehealth: Payer: Self-pay | Admitting: Family Medicine

## 2022-02-27 DIAGNOSIS — I10 Essential (primary) hypertension: Secondary | ICD-10-CM | POA: Diagnosis not present

## 2022-02-27 DIAGNOSIS — R197 Diarrhea, unspecified: Secondary | ICD-10-CM | POA: Diagnosis not present

## 2022-02-27 DIAGNOSIS — E860 Dehydration: Secondary | ICD-10-CM | POA: Diagnosis not present

## 2022-02-27 DIAGNOSIS — M545 Low back pain, unspecified: Secondary | ICD-10-CM | POA: Diagnosis not present

## 2022-02-27 DIAGNOSIS — E119 Type 2 diabetes mellitus without complications: Secondary | ICD-10-CM | POA: Diagnosis not present

## 2022-02-27 DIAGNOSIS — R1110 Vomiting, unspecified: Secondary | ICD-10-CM | POA: Diagnosis not present

## 2022-02-27 NOTE — Telephone Encounter (Signed)
I spoke with pt;  pt is presently at [facility] in [place]. Pt hopes to return home over weekend if able. Pt will cb next wk with update on how she is doing. Sending note to Dr Diona Browner and [place] pool.

## 2022-02-27 NOTE — Telephone Encounter (Signed)
Noted  

## 2022-02-27 NOTE — Telephone Encounter (Signed)
[facility] [facility] - Client TELEPHONE ADVICE RECORD [facility] Patient Name: Jean Robinson Gender: Female DOB: [birth year] Age: 67 Y 54 M 16 D Return Phone Number: [phone] (Primary) Address: City/ State/ Zip: [place] Client [place] [facility] - Client Client Site [place] - Day Provider Eliezer Lofts - MD Contact Type Call Who Is Calling Patient / Member / Family / Caregiver Call Type Triage / Clinical Relationship To Patient Self Return Phone Number [phone] (Primary) Chief Complaint SEVERE ABDOMINAL PAIN - Severe pain in abdomen Reason for Call Symptomatic / Request for [facility] transferring patient: patient started Ozempic on Sunday and she's had both diarrhea and constipation since Monday, Caller states she has severe stomach pains and she's also Type 2 diabetic Translation No Nurse Assessment Nurse: Patsey Berthold, RN, Roma Kayser Date/Time (Eastern Time): 02/27/2022 1:31:42 PM Confirm and document reason for call. If symptomatic, describe symptoms. ---Aaron Edelman transferring patient: patient started Ozempic on Sunday with lowest dose at .25 and she's had both diarrhea and constipation since Monday. First day started with some constipation- by Monday had a fair amount of constipation- she took a stool softener and hit helped. Tuesday had onset of diarrhea and used Imodium which helped as sell. On Wednesday started having vomiting very early in the AM (3AM) with stomach cramps. Thursday around noon vomiting had stopped- and felt much better. Today woke up very early as diabetic sensor woke her up for a blood glucose of 80. Caller states she has severe stomach pains and she's also Type 2 diabetic. Has all day stomach cramps, diarrhea, nausea- did not make it to restroom. Does the patient have any new or worsening symptoms? ---Yes Will a triage be completed?  ---Yes Related visit to physician within the last 2 weeks? ---Yes Does the PT have any chronic conditions? (i.e. diabetes, asthma, this includes High risk factors for pregnancy, etc.) ---Yes List chronic conditions. ---DM Is this a behavioral health or substance abuse call? ---No PLEASE NOTE: All timestamps contained within this report are represented as [place] Standard Time. CONFIDENTIALTY NOTICE: This fax transmission is intended only for the addressee. It contains information that is legally privileged, confidential or otherwise protected from use or disclosure. If you are not the intended recipient, you are strictly prohibited from reviewing, disclosing, copying using or disseminating any of this information or taking any action in reliance on or regarding this information. If you have received this fax in error, please notify us immediately by telephone so that we can arrange for its return to [place]. Phone: [phone], Toll-Free: [phone], Fax: [phone] Page: 2 of 3 Call Id: [id number] Nurse Assessment Guidelines Guideline Title Affirmed Question Affirmed Notes Nurse Date/Time Eilene Ghazi Time) Abdominal Pain - Female [1] MILDMODERATE pain AND [2] constant AND [3] present > 2 hours Vivi Ferns 02/27/2022 1:39:14 PM Disp. Time Eilene Ghazi Time) Disposition Final User 02/27/2022 1:30:38 PM Send to Urgent Daron Offer, [place] 02/27/2022 1:52:31 PM See HCP within 4 Hours (or PCP triage) Yes Patsey Berthold, RN, Roma Kayser Final Disposition 02/27/2022 1:52:31 PM See HCP within 4 Hours (or PCP triage) Yes Patsey Berthold, RN, Melvyn Novas Disagree/Comply Comply Caller Understands Yes PreDisposition Call Doctor Care Advice Given Per Guideline SEE HCP (OR PCP TRIAGE) WITHIN 4 HOURS: * IF OFFICE WILL BE OPEN: You need to be seen within the next 3 or 4 hours. Call your doctor (or NP/PA) now or as soon as the office opens. CALL BACK IF: * You become worse  CARE ADVICE given per  Abdominal Pain - Female (Adult) guideline. Comments User: Diana Eves, RN Date/Time Eilene Ghazi Time): 02/27/2022 1:50:40 PM Caller declined attempting to get appointment with office as she is out of town in [place] for a grandchild's birth. Was calling for recommendations and concern about abdominal pain and on week 1. Encouraged caller to seek out UC or evaluation at her convenience out of town. She has follow up with provider in one week. Caller will look into urgent care offices near her in [place]. User: Diana Eves, RN Date/Time Eilene Ghazi Time): 02/27/2022 1:52:21 PM Caller asks if she can take a 4th dose of imodium today- encouraged caller not to overload or overdose on OTC meds as max dose is '16mg'$  per packaging. Referrals GO TO FACILITY UNDECIDED REFERRED TO PCP OFFIC

## 2022-02-27 NOTE — Telephone Encounter (Signed)
Patient called and stated she started Ozempic on Sunday and she has been dealing with constipation and diarrhea on Monday, Tuesday, and today. Patient was sent to access nurse.

## 2022-03-06 ENCOUNTER — Encounter: Payer: Self-pay | Admitting: Family Medicine

## 2022-03-06 ENCOUNTER — Ambulatory Visit (INDEPENDENT_AMBULATORY_CARE_PROVIDER_SITE_OTHER): Payer: PPO | Admitting: Family Medicine

## 2022-03-06 VITALS — BP 130/66 | HR 84 | Temp 98.6°F | Ht 66.25 in | Wt 182.5 lb

## 2022-03-06 DIAGNOSIS — E1165 Type 2 diabetes mellitus with hyperglycemia: Secondary | ICD-10-CM | POA: Diagnosis not present

## 2022-03-06 DIAGNOSIS — R197 Diarrhea, unspecified: Secondary | ICD-10-CM

## 2022-03-06 NOTE — Assessment & Plan Note (Addendum)
Chronic, improving per fasting blood sugars.  Of note it does look like she is still approximately 20% of the time out of ideal range.  Her continuous blood sugar monitor suggest that later in the day is the bigger area problems for her.  We discussed diet modification and continued sliding scale use of insulin in addition to her other medications.  She will follow-up in 2 months for reevaluation of A1c

## 2022-03-06 NOTE — Assessment & Plan Note (Signed)
Acute, now resolved. Temporarily it seems this severe episode was likely secondary to Ozempic.  Alternatively could have possibly been infectious in origin given elevated white blood cells.  Although discussed with patient how white blood cells can be elevated in times of body stress.  She will remain off Ozempic.  We did discuss possibly retrying it in the future but given her possible reaction is so severe she is very hesitant which is reasonable.  I did also discuss possible Rybelsus as an option for trial given this is a daily medicine as opposed to weekly.

## 2022-03-06 NOTE — Progress Notes (Signed)
Patient ID: Jean Robinson, female    DOB: 1955/09/10, 67 y.o.   MRN: [id number]  This visit was conducted in person.  BP 130/66   Pulse 84   Temp 98.6 F (37 C) (Oral)   Ht 5' 6.25" (1.683 m)   Wt 182 lb 8 oz (82.8 kg)   LMP 02/02/2006   SpO2 99%   BMI 29.23 kg/m    CC:  Chief Complaint  Patient presents with   Diarrhea    ER Follow Up    Subjective:   HPI: Jean Robinson is a 67 y.o. female presenting on 03/06/2022 for Diarrhea (ER Follow Up)  Reviewed emergency department visit from February 27, 2022 for acute diarrhea. Occurred after starting low-dose Ozempic for the first time.  2 days following initial injection she started with profound vomiting followed by  explosive diarrhea (20-30 times a day).    BMET, lipase,hepatic panel cbc nml  Today she reports  she has remained off ozempic and her symptoms have resolved.  No further vomiting or diarrhea.  Now nml UOP and nml po intake.  She has been more focuses on healthy eating and regular exercise.   FBS 110-115,  2 pp 160-220  Using freestyle libre... currently 137.  Lab Results  Component Value Date   HGBA1C 9.4 (H) 02/06/2022   Wt Readings from Last 3 Encounters:  03/06/22 182 lb 8 oz (82.8 kg)  02/13/22 182 lb 4 oz (82.7 kg)  10/10/21 185 lb 2 oz (84 kg)       Relevant past medical, surgical, family and social history reviewed and updated as indicated. Interim medical history since our last visit reviewed. Allergies and medications reviewed and updated. Outpatient Medications Prior to Visit  Medication Sig Dispense Refill   alendronate (FOSAMAX) 70 MG tablet TAKE 1 TABLET (70 MG TOTAL) BY MOUTH ONCE A WEEK. 12 tablet 3   B-D ULTRAFINE III SHORT PEN 31G X 8 MM MISC USE 1 PEN AS DIRECTED 100 each 1   Blood Glucose Monitoring Suppl (ONETOUCH VERIO REFLECT) w/Device KIT Check blood sugar twice a day and as directed. Dx E11.65 1 kit 0   cetirizine (ZYRTEC) 10 MG tablet Take 1 tablet (10 mg total) by mouth daily. 30  tablet 11   Continuous Blood Gluc Receiver (FREESTYLE LIBRE 2 READER) DEVI Use to check blood sugar continuous due to low blood sugars at night.  Updated Rx 1 each 0   Continuous Blood Gluc Sensor (FREESTYLE LIBRE 2 SENSOR) MISC USE TO CHECK BLOOD SUGAR CONTINUOUS DUE TO LOW BLOOD SUGARS AT NIGHT. UPDATED RX 2 each 11   diclofenac (VOLTAREN) 75 MG EC tablet Take 1 tablet (75 mg total) by mouth 2 (two) times daily. 30 tablet 0   fluticasone (FLONASE) 50 MCG/ACT nasal spray SPRAY 2 SPRAYS INTO EACH NOSTRIL EVERY DAY 48 mL 3   insulin aspart (NOVOLOG FLEXPEN) 100 UNIT/ML FlexPen USING SLIDING SCALE TO DETERMINE MEALTIME DOSE SUBCUTANEOUS INJECTION, MAX DOSE PER AY 20 UNITS 15 mL 3   insulin glargine (LANTUS SOLOSTAR) 100 UNIT/ML Solostar Pen Inject 38 Units into the skin daily. 15 mL 11   Lancet Devices (ONE TOUCH DELICA LANCING DEV) MISC Check blood sugar twice a day and as directed. Dx E11.65 1 each 0   latanoprost (XALATAN) 0.005 % ophthalmic solution Place 1 drop into both eyes at bedtime.      lisinopril (ZESTRIL) 40 MG tablet Take 1 tablet (40 mg total) by mouth daily. 90 tablet  3   meloxicam (MOBIC) 7.5 MG tablet TAKE 1 TABLET BY MOUTH DAILY AS NEEDED FOR PAIN 30 tablet 0   metFORMIN (GLUCOPHAGE-XR) 500 MG 24 hr tablet TAKE 2 TABLETS BY MOUTH TWICE A DAY 360 tablet 1   nitroGLYCERIN (NITRODUR - DOSED IN MG/24 HR) 0.2 mg/hr patch Place 1 patch (0.2 mg total) onto the skin daily. Apply to Achilles tendon 30 patch 0   ONETOUCH VERIO test strip USE TO CHECK BLOOD SUGAR THREE TIMES DAILY. DX: E11.65 100 strip 11   pravastatin (PRAVACHOL) 40 MG tablet TAKE 1 TABLET BY MOUTH EVERY DAY 90 tablet 3   sertraline (ZOLOFT) 25 MG tablet Take 1 tablet (25 mg total) by mouth daily. 90 tablet 1   SYSTANE ULTRA 0.4-0.3 % SOLN SMARTSIG:1 Drop(s) In Eye(s) As Needed     valACYclovir (VALTREX) 500 MG tablet TAKE 1 TABLET BY MOUTH 2 TIMES DAILY, TAKE FOR 3 DAYS FOR ACUTE FLARE 180 tablet 0   Semaglutide,0.25 or  0.'5MG'$ /DOS, (OZEMPIC, 0.25 OR 0.5 MG/DOSE,) 2 MG/3ML SOPN 0.25 mg weekly (Patient not taking: Reported on 03/06/2022) 3 mL 0   No facility-administered medications prior to visit.     Per HPI unless specifically indicated in ROS section below Review of Systems  Constitutional:  Negative for fatigue and fever.  HENT:  Negative for congestion.   Eyes:  Negative for pain.  Respiratory:  Negative for cough and shortness of breath.   Cardiovascular:  Negative for chest pain, palpitations and leg swelling.  Gastrointestinal:  Negative for abdominal pain.  Genitourinary:  Negative for dysuria and vaginal bleeding.  Musculoskeletal:  Negative for back pain.  Neurological:  Negative for syncope, light-headedness and headaches.  Psychiatric/Behavioral:  Negative for dysphoric mood.    Objective:  BP 130/66   Pulse 84   Temp 98.6 F (37 C) (Oral)   Ht 5' 6.25" (1.683 m)   Wt 182 lb 8 oz (82.8 kg)   LMP 02/02/2006   SpO2 99%   BMI 29.23 kg/m   Wt Readings from Last 3 Encounters:  03/06/22 182 lb 8 oz (82.8 kg)  02/13/22 182 lb 4 oz (82.7 kg)  10/10/21 185 lb 2 oz (84 kg)      Physical Exam Constitutional:      General: She is not in acute distress.    Appearance: Normal appearance. She is well-developed. She is not ill-appearing or toxic-appearing.  HENT:     Head: Normocephalic.     Right Ear: Hearing, tympanic membrane, ear canal and external ear normal. Tympanic membrane is not erythematous, retracted or bulging.     Left Ear: Hearing, tympanic membrane, ear canal and external ear normal. Tympanic membrane is not erythematous, retracted or bulging.     Nose: No mucosal edema or rhinorrhea.     Right Sinus: No maxillary sinus tenderness or frontal sinus tenderness.     Left Sinus: No maxillary sinus tenderness or frontal sinus tenderness.     Mouth/Throat:     Pharynx: Uvula midline.  Eyes:     General: Lids are normal. Lids are everted, no foreign bodies appreciated.      Conjunctiva/sclera: Conjunctivae normal.     Pupils: Pupils are equal, round, and reactive to light.  Neck:     Thyroid: No thyroid mass or thyromegaly.     Vascular: No carotid bruit.     Trachea: Trachea normal.  Cardiovascular:     Rate and Rhythm: Normal rate and regular rhythm.     Pulses:  Normal pulses.     Heart sounds: Normal heart sounds, S1 normal and S2 normal. No murmur heard.    No friction rub. No gallop.  Pulmonary:     Effort: Pulmonary effort is normal. No tachypnea or respiratory distress.     Breath sounds: Normal breath sounds. No decreased breath sounds, wheezing, rhonchi or rales.  Abdominal:     General: Bowel sounds are normal.     Palpations: Abdomen is soft.     Tenderness: There is no abdominal tenderness.  Musculoskeletal:     Cervical back: Normal range of motion and neck supple.  Skin:    General: Skin is warm and dry.     Findings: No rash.  Neurological:     Mental Status: She is alert.  Psychiatric:        Mood and Affect: Mood is not anxious or depressed.        Speech: Speech normal.        Behavior: Behavior normal. Behavior is cooperative.        Thought Content: Thought content normal.        Judgment: Judgment normal.       Results for orders placed or performed in visit on 02/06/22  Comprehensive metabolic panel  Result Value Ref Range   Sodium 140 135 - 145 mEq/L   Potassium 4.2 3.5 - 5.1 mEq/L   Chloride 103 96 - 112 mEq/L   CO2 28 19 - 32 mEq/L   Glucose, Bld 108 (H) 70 - 99 mg/dL   BUN 15 6 - 23 mg/dL   Creatinine, Ser 0.90 0.40 - 1.20 mg/dL   Total Bilirubin 0.4 0.2 - 1.2 mg/dL   Alkaline Phosphatase 64 39 - 117 U/L   AST 15 0 - 37 U/L   ALT 14 0 - 35 U/L   Total Protein 6.8 6.0 - 8.3 g/dL   Albumin 4.3 3.5 - 5.2 g/dL   GFR 65.70 >60.00 mL/min   Calcium 10.6 (H) 8.4 - 10.5 mg/dL  Microalbumin / creatinine urine ratio  Result Value Ref Range   Microalb, Ur 1.6 0.0 - 1.9 mg/dL   Creatinine,U 123.6 mg/dL   Microalb  Creat Ratio 1.3 0.0 - 30.0 mg/g  Lipid panel  Result Value Ref Range   Cholesterol 172 0 - 200 mg/dL   Triglycerides 93.0 0.0 - 149.0 mg/dL   HDL 54.60 >39.00 mg/dL   VLDL 18.6 0.0 - 40.0 mg/dL   LDL Cholesterol 99 0 - 99 mg/dL   Total CHOL/HDL Ratio 3    NonHDL 117.63   Hemoglobin A1c  Result Value Ref Range   Hgb A1c MFr Bld 9.4 (H) 4.6 - 6.5 %    Assessment and Plan  Acute diarrhea Assessment & Plan: Acute, now resolved. Temporarily it seems this severe episode was likely secondary to Ozempic.  Alternatively could have possibly been infectious in origin given elevated white blood cells.  Although discussed with patient how white blood cells can be elevated in times of body stress.  She will remain off Ozempic.  We did discuss possibly retrying it in the future but given her possible reaction is so severe she is very hesitant which is reasonable.  I did also discuss possible Rybelsus as an option for trial given this is a daily medicine as opposed to weekly.    Uncontrolled type 2 diabetes mellitus with hyperglycemia (HCC) Assessment & Plan: Chronic, improving per fasting blood sugars.  Of note it does look like she is  still approximately 20% of the time out of ideal range.  Her continuous blood sugar monitor suggest that later in the day is the bigger area problems for her.  We discussed diet modification and continued sliding scale use of insulin in addition to her other medications.  She will follow-up in 2 months for reevaluation of A1c     Return in about 2 months (around 05/05/2022) for diabetes follow up  POC A1C.   Eliezer Lofts, MD

## 2022-03-29 ENCOUNTER — Other Ambulatory Visit: Payer: Self-pay | Admitting: Family Medicine

## 2022-03-29 DIAGNOSIS — E1165 Type 2 diabetes mellitus with hyperglycemia: Secondary | ICD-10-CM

## 2022-03-30 ENCOUNTER — Other Ambulatory Visit: Payer: Self-pay | Admitting: Family Medicine

## 2022-03-30 DIAGNOSIS — J029 Acute pharyngitis, unspecified: Secondary | ICD-10-CM

## 2022-04-05 ENCOUNTER — Other Ambulatory Visit: Payer: Self-pay | Admitting: Family Medicine

## 2022-04-05 DIAGNOSIS — A6009 Herpesviral infection of other urogenital tract: Secondary | ICD-10-CM

## 2022-04-05 NOTE — Telephone Encounter (Signed)
Last office visit 03/06/22 for acute diarrhea.  Last 01/29/22 for  #180 with no refills.  Next Appt: 05/12/22 for DM.

## 2022-04-20 ENCOUNTER — Encounter: Payer: Self-pay | Admitting: Internal Medicine

## 2022-04-20 ENCOUNTER — Ambulatory Visit (INDEPENDENT_AMBULATORY_CARE_PROVIDER_SITE_OTHER): Payer: PPO | Admitting: Internal Medicine

## 2022-04-20 VITALS — BP 158/92 | HR 91 | Temp 97.1°F | Ht 66.25 in | Wt 189.0 lb

## 2022-04-20 DIAGNOSIS — M1000 Idiopathic gout, unspecified site: Secondary | ICD-10-CM | POA: Diagnosis not present

## 2022-04-20 DIAGNOSIS — M109 Gout, unspecified: Secondary | ICD-10-CM | POA: Insufficient documentation

## 2022-04-20 DIAGNOSIS — J069 Acute upper respiratory infection, unspecified: Secondary | ICD-10-CM | POA: Diagnosis not present

## 2022-04-20 MED ORDER — HYDROCODONE BIT-HOMATROP MBR 5-1.5 MG/5ML PO SOLN: 5.0000 mL | Freq: Every evening | ORAL | 0 refills | Status: DC | PRN

## 2022-04-20 MED ORDER — COLCHICINE 0.6 MG PO TABS: 0.6000 mg | ORAL_TABLET | Freq: Every day | ORAL | 0 refills | Status: DC

## 2022-04-20 NOTE — Assessment & Plan Note (Signed)
Sounds fairly classic Will give colchicine for prn use She can discuss this again with Dr B at her next visit

## 2022-04-20 NOTE — Progress Notes (Signed)
Subjective:    Patient ID: Jean Robinson, female    DOB: 1955/04/05, 67 y.o.   MRN: [id number]  HPI Here due to a persistent cough  Has felt miserable in the past few days Having day cough---but miserable at night and keeping her up Goes back 5-6 days--woke with trouble swallowing ("like junk stuck down there") No fever No SOB No headache or ear pain Only rare sinus infections Did have slight achiness in throat Slight color to mucus lately  Year round allergies Takes allegra and claritin  Is propping up at night Uses humidifier  Feels she has had gout--in right great toe 3 weeks ago Tried husband's colchicine --and it helped (but then had some pain in right wrist also)  Current Outpatient Medications on File Prior to Visit  Medication Sig Dispense Refill   alendronate (FOSAMAX) 70 MG tablet TAKE 1 TABLET (70 MG TOTAL) BY MOUTH ONCE A WEEK. 12 tablet 3   Blood Glucose Monitoring Suppl (ONETOUCH VERIO REFLECT) w/Device KIT Check blood sugar twice a day and as directed. Dx E11.65 1 kit 0   cetirizine (ZYRTEC) 10 MG tablet Take 1 tablet (10 mg total) by mouth daily. 30 tablet 11   Continuous Blood Gluc Receiver (FREESTYLE LIBRE 2 READER) DEVI Use to check blood sugar continuous due to low blood sugars at night.  Updated Rx 1 each 0   Continuous Blood Gluc Sensor (FREESTYLE LIBRE 2 SENSOR) MISC USE TO CHECK BLOOD SUGAR CONTINUOUS DUE TO LOW BLOOD SUGARS AT NIGHT. UPDATED RX 2 each 11   diclofenac (VOLTAREN) 75 MG EC tablet Take 1 tablet (75 mg total) by mouth 2 (two) times daily. 30 tablet 0   fluticasone (FLONASE) 50 MCG/ACT nasal spray SPRAY 2 SPRAYS INTO EACH NOSTRIL EVERY DAY 48 mL 3   insulin aspart (NOVOLOG FLEXPEN) 100 UNIT/ML FlexPen USING SLIDING SCALE TO DETERMINE MEALTIME DOSE SUBCUTANEOUS INJECTION, MAX DOSE PER AY 20 UNITS 15 mL 3   insulin glargine (LANTUS SOLOSTAR) 100 UNIT/ML Solostar Pen Inject 38 Units into the skin daily. 15 mL 11   Insulin Pen Needle (B-D ULTRAFINE  III SHORT PEN) 31G X 8 MM MISC USE 1 PEN AS DIRECTED 100 each 3   Lancet Devices (ONE TOUCH DELICA LANCING DEV) MISC Check blood sugar twice a day and as directed. Dx E11.65 1 each 0   latanoprost (XALATAN) 0.005 % ophthalmic solution Place 1 drop into both eyes at bedtime.      lisinopril (ZESTRIL) 40 MG tablet Take 1 tablet (40 mg total) by mouth daily. 90 tablet 3   meloxicam (MOBIC) 7.5 MG tablet TAKE 1 TABLET BY MOUTH DAILY AS NEEDED FOR PAIN 30 tablet 0   metFORMIN (GLUCOPHAGE-XR) 500 MG 24 hr tablet TAKE 2 TABLETS BY MOUTH TWICE A DAY 360 tablet 1   nitroGLYCERIN (NITRODUR - DOSED IN MG/24 HR) 0.2 mg/hr patch Place 1 patch (0.2 mg total) onto the skin daily. Apply to Achilles tendon 30 patch 0   ONETOUCH VERIO test strip USE TO CHECK BLOOD SUGAR THREE TIMES DAILY. DX: E11.65 100 strip 11   pravastatin (PRAVACHOL) 40 MG tablet TAKE 1 TABLET BY MOUTH EVERY DAY 90 tablet 3   sertraline (ZOLOFT) 25 MG tablet Take 1 tablet (25 mg total) by mouth daily. 90 tablet 1   SYSTANE ULTRA 0.4-0.3 % SOLN SMARTSIG:1 Drop(s) In Eye(s) As Needed     valACYclovir (VALTREX) 500 MG tablet TAKE 1 TABLET BY MOUTH 2 TIMES DAILY, TAKE FOR 3 DAYS FOR  ACUTE FLARE 180 tablet 0   No current facility-administered medications on file prior to visit.    No Known Allergies  Past Medical History:  Diagnosis Date   Achilles tendonitis    Allergy    Anxiety    Closed fracture of lateral portion of right tibial plateau 07/30/2017   COVID 2021   Depression    Diabetes mellitus    H/O renal calculi 10/08/2009   Hyperlipidemia    Hypertension    Serum calcium elevated    STD (sexually transmitted disease)    HSV I & II, IgG reflex positive   Tibial plateau fracture, right     Past Surgical History:  Procedure Laterality Date   APPENDECTOMY     CHOLECYSTECTOMY     ORIF TIBIA PLATEAU Right 07/30/2017   Procedure: OPEN REDUCTION INTERNAL FIXATION (ORIF) TIBIAL PLATEAU;  Surgeon: Marchia Bond, MD;  Location:  [facility];  Service: Orthopedics;  Laterality: Right;   TONSILLECTOMY      Family History  Problem Relation Age of Onset   Depression Father    Diabetes Father    COPD Mother    Diabetes Mother    Macular degeneration Mother    Diabetes Brother    Cancer Other        liver   Diabetes Other     Social History   Socioeconomic History   Marital status: Married    Spouse name: Not on file   Number of children: 4   Years of education: Not on file   Highest education level: Not on file  Occupational History   Occupation: [employer]    Employer: [employer]  Tobacco Use   Smoking status: Never   Smokeless tobacco: Never  Vaping Use   Vaping Use: Never used  Substance and Sexual Activity   Alcohol use: Yes    Alcohol/week: 0.0 - 2.0 standard drinks of alcohol    Comment: 2 drinks/month   Drug use: No   Sexual activity: Yes    Partners: Male    Birth control/protection: Post-menopausal  Other Topics Concern   Not on file  Social History Narrative   Regular exercise --yes, treadmill 15 min 3 days per week    Originally from [place] and moved here 30 years ago and went to college at [employer] with a degree in the child education   Occasional drinker and nonsmoker   Has 4 children   Lives with her second husband   Social Determinants of Health   Financial Resource Strain: Not on file  Food Insecurity: No Food Insecurity (07/25/2019)   Hunger Vital Sign    Worried About Running Out of Food in the Last Year: Never true    Ran Out of Food in the Last Year: Never true  Transportation Needs: No Transportation Needs (07/25/2019)   PRAPARE - Hydrologist (Medical): No    Lack of Transportation (Non-Medical): No  Physical Activity: Not on file  Stress: Not on file  Social Connections: Not on file  Intimate Partner Violence: Not on file   Review of Systems No NV Eating okay     Objective:   Physical  Exam Constitutional:      Appearance: Normal appearance.  HENT:     Head:     Comments: No maxillary tenderness    Right Ear: Tympanic membrane and ear canal normal.     Left Ear: Tympanic membrane and ear canal normal.  Nose: Congestion present.     Mouth/Throat:     Pharynx: No oropharyngeal exudate or posterior oropharyngeal erythema.  Pulmonary:     Effort: Pulmonary effort is normal.     Breath sounds: Normal breath sounds. No wheezing or rales.  Musculoskeletal:     Cervical back: Neck supple.  Lymphadenopathy:     Cervical: No cervical adenopathy.  Neurological:     Mental Status: She is alert.            Assessment & Plan:

## 2022-04-20 NOTE — Assessment & Plan Note (Signed)
Sounds like more than just her allergies Did discuss doubling up on antihistamine prn Will give cough syrup If worsening in the next few days, will try empiric antibiotic (amoxil)

## 2022-04-29 ENCOUNTER — Encounter: Payer: Self-pay | Admitting: Family Medicine

## 2022-04-29 ENCOUNTER — Ambulatory Visit (INDEPENDENT_AMBULATORY_CARE_PROVIDER_SITE_OTHER): Payer: PPO | Admitting: Family Medicine

## 2022-04-29 VITALS — BP 124/60 | HR 97 | Temp 97.8°F | Ht 66.25 in | Wt 189.0 lb

## 2022-04-29 DIAGNOSIS — J209 Acute bronchitis, unspecified: Secondary | ICD-10-CM | POA: Diagnosis not present

## 2022-04-29 MED ORDER — DOXYCYCLINE HYCLATE 100 MG PO TABS: 100.0000 mg | ORAL_TABLET | Freq: Two times a day (BID) | ORAL | 0 refills | Status: DC

## 2022-04-29 MED ORDER — BENZONATATE 200 MG PO CAPS: 200.0000 mg | ORAL_CAPSULE | Freq: Three times a day (TID) | ORAL | 0 refills | Status: DC | PRN

## 2022-04-29 MED ORDER — HYDROCODONE BIT-HOMATROP MBR 5-1.5 MG/5ML PO SOLN: 5.0000 mL | Freq: Every evening | ORAL | 0 refills | Status: DC | PRN

## 2022-04-29 MED ORDER — ALBUTEROL SULFATE HFA 108 (90 BASE) MCG/ACT IN AERS: 2.0000 | INHALATION_SPRAY | Freq: Four times a day (QID) | RESPIRATORY_TRACT | 0 refills | Status: DC | PRN

## 2022-04-29 NOTE — Progress Notes (Signed)
Patient ID: Jean Robinson, female    DOB: 1955-04-18, 67 y.o.   MRN: [id number]  This visit was conducted in person.  BP (!) 140/72   Pulse 97   Temp 97.8 F (36.6 C) (Temporal)   Ht 5' 6.25" (1.683 m)   Wt 189 lb (85.7 kg)   LMP 02/02/2006   SpO2 97%   BMI 30.28 kg/m    CC:  Chief Complaint  Patient presents with   Cough    Seen Dr. Silvio Pate 04/20/22    Subjective:   HPI: Jean Robinson is a 67 y.o. female presenting on 04/29/2022 for Cough (Seen Dr. Silvio Pate 04/20/22)  Reviewed office visit note from April 20, 2022. All diagnosed with viral upper respiratory tract infection with cough.  Treated with Tussionex cough syrup At the same time she was also treated for gout flare with colchicine   Today she reports  ongoing lingering cough now for > 2 weeks.  Keeping her up at night. Productive cough off and on... clear.  No SOB, no wheeze.  No flu like symptoms.   Benzonatate helps some.  No ear pain, no ST.   Husband was sick...   Has history of diabetes No history of COPD or asthma, non-smoker.  During illness FBS 113,  occ dropping low   Lab Results  Component Value Date   HGBA1C 9.4 (H) 02/06/2022    Relevant past medical, surgical, family and social history reviewed and updated as indicated. Interim medical history since our last visit reviewed. Allergies and medications reviewed and updated. Outpatient Medications Prior to Visit  Medication Sig Dispense Refill   alendronate (FOSAMAX) 70 MG tablet TAKE 1 TABLET (70 MG TOTAL) BY MOUTH ONCE A WEEK. 12 tablet 3   Blood Glucose Monitoring Suppl (ONETOUCH VERIO REFLECT) w/Device KIT Check blood sugar twice a day and as directed. Dx E11.65 1 kit 0   cetirizine (ZYRTEC) 10 MG tablet Take 1 tablet (10 mg total) by mouth daily. 30 tablet 11   colchicine 0.6 MG tablet Take 1 tablet (0.6 mg total) by mouth daily. 30 tablet 0   Continuous Blood Gluc Receiver (FREESTYLE LIBRE 2 READER) DEVI Use to check blood sugar continuous  due to low blood sugars at night.  Updated Rx 1 each 0   Continuous Blood Gluc Sensor (FREESTYLE LIBRE 2 SENSOR) MISC USE TO CHECK BLOOD SUGAR CONTINUOUS DUE TO LOW BLOOD SUGARS AT NIGHT. UPDATED RX 2 each 11   diclofenac (VOLTAREN) 75 MG EC tablet Take 1 tablet (75 mg total) by mouth 2 (two) times daily. 30 tablet 0   fluticasone (FLONASE) 50 MCG/ACT nasal spray SPRAY 2 SPRAYS INTO EACH NOSTRIL EVERY DAY 48 mL 3   insulin aspart (NOVOLOG FLEXPEN) 100 UNIT/ML FlexPen USING SLIDING SCALE TO DETERMINE MEALTIME DOSE SUBCUTANEOUS INJECTION, MAX DOSE PER AY 20 UNITS 15 mL 3   insulin glargine (LANTUS SOLOSTAR) 100 UNIT/ML Solostar Pen Inject 38 Units into the skin daily. 15 mL 11   Insulin Pen Needle (B-D ULTRAFINE III SHORT PEN) 31G X 8 MM MISC USE 1 PEN AS DIRECTED 100 each 3   Lancet Devices (ONE TOUCH DELICA LANCING DEV) MISC Check blood sugar twice a day and as directed. Dx E11.65 1 each 0   latanoprost (XALATAN) 0.005 % ophthalmic solution Place 1 drop into both eyes at bedtime.      lisinopril (ZESTRIL) 40 MG tablet Take 1 tablet (40 mg total) by mouth daily. 90 tablet 3   meloxicam (  MOBIC) 7.5 MG tablet TAKE 1 TABLET BY MOUTH DAILY AS NEEDED FOR PAIN 30 tablet 0   metFORMIN (GLUCOPHAGE-XR) 500 MG 24 hr tablet TAKE 2 TABLETS BY MOUTH TWICE A DAY 360 tablet 1   nitroGLYCERIN (NITRODUR - DOSED IN MG/24 HR) 0.2 mg/hr patch Place 1 patch (0.2 mg total) onto the skin daily. Apply to Achilles tendon 30 patch 0   ONETOUCH VERIO test strip USE TO CHECK BLOOD SUGAR THREE TIMES DAILY. DX: E11.65 100 strip 11   pravastatin (PRAVACHOL) 40 MG tablet TAKE 1 TABLET BY MOUTH EVERY DAY 90 tablet 3   sertraline (ZOLOFT) 25 MG tablet Take 1 tablet (25 mg total) by mouth daily. 90 tablet 1   SYSTANE ULTRA 0.4-0.3 % SOLN SMARTSIG:1 Drop(s) In Eye(s) As Needed     valACYclovir (VALTREX) 500 MG tablet TAKE 1 TABLET BY MOUTH 2 TIMES DAILY, TAKE FOR 3 DAYS FOR ACUTE FLARE 180 tablet 0   HYDROcodone bit-homatropine  (HYCODAN) 5-1.5 MG/5ML syrup Take 5 mLs by mouth at bedtime as needed for cough. 120 mL 0   No facility-administered medications prior to visit.     Per HPI unless specifically indicated in ROS section below Review of Systems  HENT:  Positive for congestion. Negative for ear pain, sinus pressure and sinus pain.   Respiratory:  Positive for cough. Negative for shortness of breath and wheezing.        Constant coughing fits   Objective:  BP (!) 140/72   Pulse 97   Temp 97.8 F (36.6 C) (Temporal)   Ht 5' 6.25" (1.683 m)   Wt 189 lb (85.7 kg)   LMP 02/02/2006   SpO2 97%   BMI 30.28 kg/m   Wt Readings from Last 3 Encounters:  04/29/22 189 lb (85.7 kg)  04/20/22 189 lb (85.7 kg)  03/06/22 182 lb 8 oz (82.8 kg)      Physical Exam Constitutional:      General: She is not in acute distress.    Appearance: Normal appearance. She is well-developed. She is not ill-appearing or toxic-appearing.  HENT:     Head: Normocephalic.     Right Ear: Hearing, tympanic membrane, ear canal and external ear normal. Tympanic membrane is not erythematous, retracted or bulging.     Left Ear: Hearing, tympanic membrane, ear canal and external ear normal. Tympanic membrane is not erythematous, retracted or bulging.     Nose: No mucosal edema or rhinorrhea.     Right Sinus: No maxillary sinus tenderness or frontal sinus tenderness.     Left Sinus: No maxillary sinus tenderness or frontal sinus tenderness.     Mouth/Throat:     Pharynx: Uvula midline.  Eyes:     General: Lids are normal. Lids are everted, no foreign bodies appreciated.     Conjunctiva/sclera: Conjunctivae normal.     Pupils: Pupils are equal, round, and reactive to light.  Neck:     Thyroid: No thyroid mass or thyromegaly.     Vascular: No carotid bruit.     Trachea: Trachea normal.  Cardiovascular:     Rate and Rhythm: Normal rate and regular rhythm.     Pulses: Normal pulses.     Heart sounds: Normal heart sounds, S1 normal and  S2 normal. No murmur heard.    No friction rub. No gallop.  Pulmonary:     Effort: Pulmonary effort is normal. No tachypnea or respiratory distress.     Breath sounds: Normal breath sounds. No decreased breath sounds,  wheezing, rhonchi or rales.     Comments: Constant coughing fit. Abdominal:     General: Bowel sounds are normal.     Palpations: Abdomen is soft.     Tenderness: There is no abdominal tenderness.  Musculoskeletal:     Cervical back: Normal range of motion and neck supple.  Skin:    General: Skin is warm and dry.     Findings: No rash.  Neurological:     Mental Status: She is alert.  Psychiatric:        Mood and Affect: Mood is not anxious or depressed.        Speech: Speech normal.        Behavior: Behavior normal. Behavior is cooperative.        Thought Content: Thought content normal.        Judgment: Judgment normal.       Results for orders placed or performed in visit on 02/06/22  Comprehensive metabolic panel  Result Value Ref Range   Sodium 140 135 - 145 mEq/L   Potassium 4.2 3.5 - 5.1 mEq/L   Chloride 103 96 - 112 mEq/L   CO2 28 19 - 32 mEq/L   Glucose, Bld 108 (H) 70 - 99 mg/dL   BUN 15 6 - 23 mg/dL   Creatinine, Ser 0.90 0.40 - 1.20 mg/dL   Total Bilirubin 0.4 0.2 - 1.2 mg/dL   Alkaline Phosphatase 64 39 - 117 U/L   AST 15 0 - 37 U/L   ALT 14 0 - 35 U/L   Total Protein 6.8 6.0 - 8.3 g/dL   Albumin 4.3 3.5 - 5.2 g/dL   GFR 65.70 >60.00 mL/min   Calcium 10.6 (H) 8.4 - 10.5 mg/dL  Microalbumin / creatinine urine ratio  Result Value Ref Range   Microalb, Ur 1.6 0.0 - 1.9 mg/dL   Creatinine,U 123.6 mg/dL   Microalb Creat Ratio 1.3 0.0 - 30.0 mg/g  Lipid panel  Result Value Ref Range   Cholesterol 172 0 - 200 mg/dL   Triglycerides 93.0 0.0 - 149.0 mg/dL   HDL 54.60 >39.00 mg/dL   VLDL 18.6 0.0 - 40.0 mg/dL   LDL Cholesterol 99 0 - 99 mg/dL   Total CHOL/HDL Ratio 3    NonHDL 117.63   Hemoglobin A1c  Result Value Ref Range   Hgb A1c MFr Bld  9.4 (H) 4.6 - 6.5 %    Assessment and Plan  Acute bronchitis, unspecified organism Assessment & Plan: Acute, given persistent symptoms now ongoing 2 weeks, concern for bacterial superinfection.  Will treat with doxycycline 100 mg p.o. twice daily x 7 days to cover for walking pneumonia. Can use cough suppressant as needed..  Benzonatate and Tussionex. Given coughing fits and reactive airway like symptoms will provide albuterol inhaler to use as needed.  Discussed prednisone taper but she would like to hold off given her difficulty with fluctuating blood sugars with diabetes.  Return and ER precautions provided.   Other orders -     Benzonatate; Take 1 capsule (200 mg total) by mouth 3 (three) times daily as needed for cough.  Dispense: 20 capsule; Refill: 0 -     HYDROcodone Bit-Homatrop MBr; Take 5 mLs by mouth at bedtime as needed for cough.  Dispense: 120 mL; Refill: 0 -     Doxycycline Hyclate; Take 1 tablet (100 mg total) by mouth 2 (two) times daily.  Dispense: 14 tablet; Refill: 0 -     Albuterol Sulfate HFA; Inhale 2  puffs into the lungs every 6 (six) hours as needed for wheezing or shortness of breath.  Dispense: 8 g; Refill: 0    No follow-ups on file.   Eliezer Lofts, MD

## 2022-04-29 NOTE — Patient Instructions (Signed)
Complete antibiotics: Doxycycline 100 mg twice daily x 7 days. Can use cough suppressants as needed.  Continue allergy medication. Rest, fluids.  Go to the emergency room for severe shortness of breath.  Call if not improving as expected.

## 2022-04-29 NOTE — Assessment & Plan Note (Signed)
Acute, given persistent symptoms now ongoing 2 weeks, concern for bacterial superinfection.  Will treat with doxycycline 100 mg p.o. twice daily x 7 days to cover for walking pneumonia. Can use cough suppressant as needed..  Benzonatate and Tussionex. Given coughing fits and reactive airway like symptoms will provide albuterol inhaler to use as needed.  Discussed prednisone taper but she would like to hold off given her difficulty with fluctuating blood sugars with diabetes.  Return and ER precautions provided.

## 2022-05-06 DIAGNOSIS — L570 Actinic keratosis: Secondary | ICD-10-CM | POA: Diagnosis not present

## 2022-05-12 ENCOUNTER — Ambulatory Visit: Payer: PPO | Admitting: Family Medicine

## 2022-05-12 ENCOUNTER — Other Ambulatory Visit: Payer: Self-pay | Admitting: Internal Medicine

## 2022-05-12 DIAGNOSIS — D2271 Melanocytic nevi of right lower limb, including hip: Secondary | ICD-10-CM | POA: Diagnosis not present

## 2022-05-12 DIAGNOSIS — L578 Other skin changes due to chronic exposure to nonionizing radiation: Secondary | ICD-10-CM | POA: Diagnosis not present

## 2022-05-12 DIAGNOSIS — D225 Melanocytic nevi of trunk: Secondary | ICD-10-CM | POA: Diagnosis not present

## 2022-05-12 DIAGNOSIS — L814 Other melanin hyperpigmentation: Secondary | ICD-10-CM | POA: Diagnosis not present

## 2022-05-12 DIAGNOSIS — Z23 Encounter for immunization: Secondary | ICD-10-CM | POA: Diagnosis not present

## 2022-05-12 DIAGNOSIS — L821 Other seborrheic keratosis: Secondary | ICD-10-CM | POA: Diagnosis not present

## 2022-05-12 NOTE — Telephone Encounter (Signed)
Does this need to go to PCP now?

## 2022-05-21 ENCOUNTER — Other Ambulatory Visit: Payer: Self-pay | Admitting: Family Medicine

## 2022-05-21 NOTE — Telephone Encounter (Signed)
Patient returned call,and she stated that she do not need the refill on the albuterol. She think that the pharmacy just have her on auto refill.

## 2022-05-21 NOTE — Telephone Encounter (Signed)
Left message for Jean Robinson to return call to office to let me know if she needs a refill on the albuterol inhaler.

## 2022-06-01 ENCOUNTER — Telehealth: Payer: Self-pay | Admitting: Family Medicine

## 2022-06-01 NOTE — Telephone Encounter (Signed)
Contacted Jean Robinson to schedule their annual wellness visit. Patient declined to schedule AWV at this time. Will call back to schedule.  [facility] AWV TEAM Direct Dial: [phone]

## 2022-06-10 ENCOUNTER — Telehealth: Payer: Self-pay | Admitting: Family Medicine

## 2022-06-10 ENCOUNTER — Encounter: Payer: Self-pay | Admitting: Family Medicine

## 2022-06-10 DIAGNOSIS — E1165 Type 2 diabetes mellitus with hyperglycemia: Secondary | ICD-10-CM

## 2022-06-10 NOTE — Telephone Encounter (Signed)
-----   Message from Alvina Chou sent at 06/08/2022  1:08 PM EDT ----- Regarding: Lab orders for  Wednesday, 5.8.24 Lab orders, A1c?

## 2022-06-17 ENCOUNTER — Other Ambulatory Visit: Payer: PPO

## 2022-06-17 DIAGNOSIS — E119 Type 2 diabetes mellitus without complications: Secondary | ICD-10-CM | POA: Diagnosis not present

## 2022-06-17 LAB — HM DIABETES EYE EXAM

## 2022-06-18 ENCOUNTER — Encounter: Payer: Self-pay | Admitting: Family Medicine

## 2022-06-24 ENCOUNTER — Ambulatory Visit: Payer: PPO | Admitting: Family Medicine

## 2022-06-25 ENCOUNTER — Other Ambulatory Visit (INDEPENDENT_AMBULATORY_CARE_PROVIDER_SITE_OTHER): Payer: PPO

## 2022-06-25 DIAGNOSIS — E1165 Type 2 diabetes mellitus with hyperglycemia: Secondary | ICD-10-CM | POA: Diagnosis not present

## 2022-06-25 DIAGNOSIS — Z7985 Long-term (current) use of injectable non-insulin antidiabetic drugs: Secondary | ICD-10-CM | POA: Diagnosis not present

## 2022-06-25 DIAGNOSIS — E213 Hyperparathyroidism, unspecified: Secondary | ICD-10-CM | POA: Diagnosis not present

## 2022-06-25 LAB — HEMOGLOBIN A1C: Hgb A1c MFr Bld: 9.3 % — ABNORMAL HIGH (ref 4.6–6.5)

## 2022-06-25 NOTE — Progress Notes (Signed)
No critical labs need to be addressed urgently. We will discuss labs in detail at upcoming office visit.   

## 2022-06-26 LAB — PTH, INTACT AND CALCIUM
Calcium: 9.9 mg/dL (ref 8.6–10.4)
PTH: 106 pg/mL — ABNORMAL HIGH (ref 16–77)

## 2022-06-26 NOTE — Progress Notes (Signed)
No critical labs need to be addressed urgently. We will discuss labs in detail at upcoming office visit.   

## 2022-07-01 ENCOUNTER — Ambulatory Visit (INDEPENDENT_AMBULATORY_CARE_PROVIDER_SITE_OTHER): Payer: PPO | Admitting: Family Medicine

## 2022-07-01 ENCOUNTER — Encounter: Payer: Self-pay | Admitting: Family Medicine

## 2022-07-01 VITALS — BP 136/74 | HR 85 | Temp 97.6°F | Ht 66.25 in | Wt 185.2 lb

## 2022-07-01 DIAGNOSIS — E1159 Type 2 diabetes mellitus with other circulatory complications: Secondary | ICD-10-CM

## 2022-07-01 DIAGNOSIS — R7989 Other specified abnormal findings of blood chemistry: Secondary | ICD-10-CM | POA: Diagnosis not present

## 2022-07-01 DIAGNOSIS — Z794 Long term (current) use of insulin: Secondary | ICD-10-CM | POA: Diagnosis not present

## 2022-07-01 DIAGNOSIS — E213 Hyperparathyroidism, unspecified: Secondary | ICD-10-CM

## 2022-07-01 DIAGNOSIS — I152 Hypertension secondary to endocrine disorders: Secondary | ICD-10-CM

## 2022-07-01 DIAGNOSIS — Z7985 Long-term (current) use of injectable non-insulin antidiabetic drugs: Secondary | ICD-10-CM

## 2022-07-01 DIAGNOSIS — E1165 Type 2 diabetes mellitus with hyperglycemia: Secondary | ICD-10-CM | POA: Diagnosis not present

## 2022-07-01 LAB — VITAMIN D 25 HYDROXY (VIT D DEFICIENCY, FRACTURES): VITD: 29.79 ng/mL — ABNORMAL LOW (ref 30.00–100.00)

## 2022-07-01 MED ORDER — TRULICITY 0.75 MG/0.5ML ~~LOC~~ SOAJ: 0.7500 mg | SUBCUTANEOUS | 0 refills | Status: DC

## 2022-07-01 MED ORDER — TRULICITY 0.75 MG/0.5ML ~~LOC~~ SOAJ
0.7500 mg | SUBCUTANEOUS | 0 refills | Status: DC
Start: 2022-07-01 — End: 2022-07-01

## 2022-07-01 NOTE — Progress Notes (Signed)
Patient ID: Jean Robinson, female    DOB: 1955-12-08, 67 y.o.   MRN: [id number]  This visit was conducted in person.  BP (!) 144/70 (BP Location: Left Arm, Patient Position: Sitting, Cuff Size: Normal)   Pulse 85   Temp 97.6 F (36.4 C) (Temporal)   Ht 5' 6.25" (1.683 m)   Wt 185 lb 4 oz (84 kg)   LMP 02/02/2006   SpO2 96%   BMI 29.67 kg/m    CC:  Chief Complaint  Patient presents with   Medical Management of Chronic Issues    Subjective:   HPI: Jean Robinson is a 67 y.o. female presenting on 07/01/2022 for Medical Management of Chronic Issues  Emergency department visit from February 27, 2022 for acute diarrhea. Occurred after starting low-dose Ozempic for the first time.  2 days following initial injection she started with profound vomiting followed by  explosive diarrhea (20-30 times a day).Today she reports  she has remained off ozempic and her symptoms have resolved.  Diabetes:  3 months ago. Lantus 40-42 Units daily Novolog SSI, glucophage XR 500 mg MAx dose. Lab Results  Component Value Date   HGBA1C 9.3 (H) 06/25/2022  Using medications without difficulties: Hypoglycemic episodes: rare lows Hyperglycemic episodes: afternoon Feet problems: none Blood Sugars averaging: CGM:   FBS 140-160 in last  2-3 weeks eye exam within last year: yes  She is willing to try [place] or trulicity.  Wt Readings from Last 3 Encounters:  07/01/22 185 lb 4 oz (84 kg)  04/29/22 189 lb (85.7 kg)  04/20/22 189 lb (85.7 kg)    Calcium in normal range now on recheck.Marland Kitchen PTH slightly elevated  No recent Vit D level. Hx of primary  hyperparathyroid.Marland Kitchen seen Dr. Talmage Nap in past She has had bone density in 2022 showing mild osteopenia. She is currently on Fosamax 70 mg weekly.     She states she is not sleeping well.Marland Kitchen getting 4-5 hours night. Using melatonin 5 mg at night.  Some increased family stress.  Working on [employer].  PHQ9: 2   GAD7:1  Relevant past medical, surgical, family and  social history reviewed and updated as indicated. Interim medical history since our last visit reviewed. Allergies and medications reviewed and updated. Outpatient Medications Prior to Visit  Medication Sig Dispense Refill   alendronate (FOSAMAX) 70 MG tablet TAKE 1 TABLET (70 MG TOTAL) BY MOUTH ONCE A WEEK. 12 tablet 3   Blood Glucose Monitoring Suppl (ONETOUCH VERIO REFLECT) w/Device KIT Check blood sugar twice a day and as directed. Dx E11.65 1 kit 0   cetirizine (ZYRTEC) 10 MG tablet Take 1 tablet (10 mg total) by mouth daily. 30 tablet 11   colchicine 0.6 MG tablet Take 1 tablet (0.6 mg total) by mouth 2 (two) times daily as needed. 180 tablet 0   Continuous Blood Gluc Receiver (FREESTYLE LIBRE 2 READER) DEVI Use to check blood sugar continuous due to low blood sugars at night.  Updated Rx 1 each 0   Continuous Blood Gluc Sensor (FREESTYLE LIBRE 2 SENSOR) MISC USE TO CHECK BLOOD SUGAR CONTINUOUS DUE TO LOW BLOOD SUGARS AT NIGHT. UPDATED RX 2 each 11   fluticasone (FLONASE) 50 MCG/ACT nasal spray SPRAY 2 SPRAYS INTO EACH NOSTRIL EVERY DAY 48 mL 3   insulin aspart (NOVOLOG FLEXPEN) 100 UNIT/ML FlexPen USING SLIDING SCALE TO DETERMINE MEALTIME DOSE SUBCUTANEOUS INJECTION, MAX DOSE PER AY 20 UNITS 15 mL 3   insulin glargine (LANTUS SOLOSTAR) 100 UNIT/ML Solostar  Pen Inject 38 Units into the skin daily. 15 mL 11   Insulin Pen Needle (B-D ULTRAFINE III SHORT PEN) 31G X 8 MM MISC USE 1 PEN AS DIRECTED 100 each 3   Lancet Devices (ONE TOUCH DELICA LANCING DEV) MISC Check blood sugar twice a day and as directed. Dx E11.65 1 each 0   latanoprost (XALATAN) 0.005 % ophthalmic solution Place 1 drop into both eyes at bedtime.      lisinopril (ZESTRIL) 40 MG tablet Take 1 tablet (40 mg total) by mouth daily. 90 tablet 3   loperamide (IMODIUM) 2 MG capsule Take 2 mg by mouth as needed.     metFORMIN (GLUCOPHAGE-XR) 500 MG 24 hr tablet TAKE 2 TABLETS BY MOUTH TWICE A DAY 360 tablet 1   nitroGLYCERIN (NITRODUR  - DOSED IN MG/24 HR) 0.2 mg/hr patch Place 1 patch (0.2 mg total) onto the skin daily. Apply to Achilles tendon 30 patch 0   ONETOUCH VERIO test strip USE TO CHECK BLOOD SUGAR THREE TIMES DAILY. DX: E11.65 100 strip 11   pravastatin (PRAVACHOL) 40 MG tablet TAKE 1 TABLET BY MOUTH EVERY DAY 90 tablet 3   sertraline (ZOLOFT) 25 MG tablet Take 1 tablet (25 mg total) by mouth daily. 90 tablet 1   SYSTANE ULTRA 0.4-0.3 % SOLN SMARTSIG:1 Drop(s) In Eye(s) As Needed     valACYclovir (VALTREX) 500 MG tablet TAKE 1 TABLET BY MOUTH 2 TIMES DAILY, TAKE FOR 3 DAYS FOR ACUTE FLARE 180 tablet 0   albuterol (VENTOLIN HFA) 108 (90 Base) MCG/ACT inhaler Inhale 2 puffs into the lungs every 6 (six) hours as needed for wheezing or shortness of breath. 8 g 0   benzonatate (TESSALON) 200 MG capsule Take 1 capsule (200 mg total) by mouth 3 (three) times daily as needed for cough. 20 capsule 0   diclofenac (VOLTAREN) 75 MG EC tablet Take 1 tablet (75 mg total) by mouth 2 (two) times daily. 30 tablet 0   doxycycline (VIBRA-TABS) 100 MG tablet Take 1 tablet (100 mg total) by mouth 2 (two) times daily. 14 tablet 0   HYDROcodone bit-homatropine (HYCODAN) 5-1.5 MG/5ML syrup Take 5 mLs by mouth at bedtime as needed for cough. 120 mL 0   meloxicam (MOBIC) 7.5 MG tablet TAKE 1 TABLET BY MOUTH DAILY AS NEEDED FOR PAIN 30 tablet 0   No facility-administered medications prior to visit.     Per HPI unless specifically indicated in ROS section below Review of Systems  Constitutional:  Negative for fatigue and fever.  HENT:  Negative for congestion.   Eyes:  Negative for pain.  Respiratory:  Negative for cough and shortness of breath.   Cardiovascular:  Negative for chest pain, palpitations and leg swelling.  Gastrointestinal:  Negative for abdominal pain.  Genitourinary:  Negative for dysuria and vaginal bleeding.  Musculoskeletal:  Negative for back pain.  Neurological:  Negative for syncope, light-headedness and headaches.   Psychiatric/Behavioral:  Negative for dysphoric mood.    Objective:  BP (!) 144/70 (BP Location: Left Arm, Patient Position: Sitting, Cuff Size: Normal)   Pulse 85   Temp 97.6 F (36.4 C) (Temporal)   Ht 5' 6.25" (1.683 m)   Wt 185 lb 4 oz (84 kg)   LMP 02/02/2006   SpO2 96%   BMI 29.67 kg/m   Wt Readings from Last 3 Encounters:  07/01/22 185 lb 4 oz (84 kg)  04/29/22 189 lb (85.7 kg)  04/20/22 189 lb (85.7 kg)  Physical Exam Constitutional:      General: She is not in acute distress.    Appearance: Normal appearance. She is well-developed. She is not ill-appearing or toxic-appearing.  HENT:     Head: Normocephalic.     Right Ear: Hearing, tympanic membrane, ear canal and external ear normal. Tympanic membrane is not erythematous, retracted or bulging.     Left Ear: Hearing, tympanic membrane, ear canal and external ear normal. Tympanic membrane is not erythematous, retracted or bulging.     Nose: No mucosal edema or rhinorrhea.     Right Sinus: No maxillary sinus tenderness or frontal sinus tenderness.     Left Sinus: No maxillary sinus tenderness or frontal sinus tenderness.     Mouth/Throat:     Pharynx: Uvula midline.  Eyes:     General: Lids are normal. Lids are everted, no foreign bodies appreciated.     Conjunctiva/sclera: Conjunctivae normal.     Pupils: Pupils are equal, round, and reactive to light.  Neck:     Thyroid: No thyroid mass or thyromegaly.     Vascular: No carotid bruit.     Trachea: Trachea normal.  Cardiovascular:     Rate and Rhythm: Normal rate and regular rhythm.     Pulses: Normal pulses.     Heart sounds: Normal heart sounds, S1 normal and S2 normal. No murmur heard.    No friction rub. No gallop.  Pulmonary:     Effort: Pulmonary effort is normal. No tachypnea or respiratory distress.     Breath sounds: Normal breath sounds. No decreased breath sounds, wheezing, rhonchi or rales.  Abdominal:     General: Bowel sounds are normal.      Palpations: Abdomen is soft.     Tenderness: There is no abdominal tenderness.  Musculoskeletal:     Cervical back: Normal range of motion and neck supple.  Skin:    General: Skin is warm and dry.     Findings: No rash.  Neurological:     Mental Status: She is alert.  Psychiatric:        Mood and Affect: Mood is not anxious or depressed.        Speech: Speech normal.        Behavior: Behavior normal. Behavior is cooperative.        Thought Content: Thought content normal.        Judgment: Judgment normal.       Results for orders placed or performed in visit on 06/25/22  Hemoglobin A1c  Result Value Ref Range   Hgb A1c MFr Bld 9.3 (H) 4.6 - 6.5 %  PTH, Intact and Calcium  Result Value Ref Range   PTH 106 (H) 16 - 77 pg/mL   Calcium 9.9 8.6 - 10.4 mg/dL    Assessment and Plan  Uncontrolled type 2 diabetes mellitus with hyperglycemia (HCC) Assessment & Plan:  Inadequate control... she feel it is n part due to poor control of stress and sleep. Encouraged for there are tweaking in diet as far as decreasing sugar and coffee and avoiding sweets later in the day.  We will have her continue her current regimen but try an additional trial of Trulicity  Lantus 40-42 Units daily Novolog SSI, glucophage XR 500 mg MAx dose. Trulicity 0.75 daily.    High serum calcium -     VITAMIN D 25 Hydroxy (Vit-D Deficiency, Fractures)  Hyperparathyroidism (HCC) Assessment & Plan:  Re-eval vit D.. if low will replace. Consider returning to  see Dr. Talmage Nap versus continuing to follow.  She has had bone density in 2022 showing mild osteopenia. She is currently on Fosamax 70 mg weekly.   Orders: -     VITAMIN D 25 Hydroxy (Vit-D Deficiency, Fractures)  Hypertension associated with diabetes (HCC) Assessment & Plan: Chronic, now well-controlled, off meloxicam.    Continue lisinopril 40 mg p.o. daily.    Other orders -     Trulicity; Inject 0.75 mg into the skin once a week.  Dispense: 2  mL; Refill: 0    Return for diabetes follow up, fasting labs prior.   Kerby Nora, MD

## 2022-07-01 NOTE — Assessment & Plan Note (Addendum)
Inadequate control... she feel it is n part due to poor control of stress and sleep. Encouraged for there are tweaking in diet as far as decreasing sugar and coffee and avoiding sweets later in the day.  We will have her continue her current regimen but try an additional trial of Trulicity  Lantus 40-42 Units daily Novolog SSI, glucophage XR 500 mg MAx dose. Trulicity 0.75 daily.

## 2022-07-01 NOTE — Patient Instructions (Addendum)
Please stop at the lab to have labs drawn.  Work on increased exercise during day and healthy sleep hygiene.  Trial of  trulicity low dose, make sure hydrated.  Call if side effects.

## 2022-07-01 NOTE — Assessment & Plan Note (Signed)
Chronic, now well-controlled, off meloxicam.    Continue lisinopril 40 mg p.o. daily.

## 2022-07-01 NOTE — Addendum Note (Signed)
Addended by: Damita Lack on: 07/01/2022 10:08 AM   Modules accepted: Orders

## 2022-07-01 NOTE — Assessment & Plan Note (Signed)
Re-eval vit D.. if low will replace. Consider returning to see Dr. Talmage Nap versus continuing to follow.  She has had bone density in 2022 showing mild osteopenia. She is currently on Fosamax 70 mg weekly.

## 2022-07-01 NOTE — Telephone Encounter (Signed)
Please submit a PA on the medication. 

## 2022-07-05 ENCOUNTER — Other Ambulatory Visit: Payer: Self-pay | Admitting: Family Medicine

## 2022-07-05 DIAGNOSIS — E1165 Type 2 diabetes mellitus with hyperglycemia: Secondary | ICD-10-CM

## 2022-07-13 DIAGNOSIS — E119 Type 2 diabetes mellitus without complications: Secondary | ICD-10-CM | POA: Diagnosis not present

## 2022-07-13 DIAGNOSIS — H40013 Open angle with borderline findings, low risk, bilateral: Secondary | ICD-10-CM | POA: Diagnosis not present

## 2022-07-13 DIAGNOSIS — H26492 Other secondary cataract, left eye: Secondary | ICD-10-CM | POA: Diagnosis not present

## 2022-07-13 DIAGNOSIS — Z961 Presence of intraocular lens: Secondary | ICD-10-CM | POA: Diagnosis not present

## 2022-07-15 ENCOUNTER — Other Ambulatory Visit: Payer: Self-pay | Admitting: Family Medicine

## 2022-07-15 DIAGNOSIS — E213 Hyperparathyroidism, unspecified: Secondary | ICD-10-CM | POA: Diagnosis not present

## 2022-07-15 DIAGNOSIS — Z13820 Encounter for screening for osteoporosis: Secondary | ICD-10-CM | POA: Diagnosis not present

## 2022-07-15 DIAGNOSIS — Z1231 Encounter for screening mammogram for malignant neoplasm of breast: Secondary | ICD-10-CM | POA: Diagnosis not present

## 2022-07-15 DIAGNOSIS — N951 Menopausal and female climacteric states: Secondary | ICD-10-CM | POA: Diagnosis not present

## 2022-07-15 LAB — HM DEXA SCAN

## 2022-07-16 ENCOUNTER — Encounter: Payer: Self-pay | Admitting: Family Medicine

## 2022-07-17 ENCOUNTER — Ambulatory Visit (INDEPENDENT_AMBULATORY_CARE_PROVIDER_SITE_OTHER): Payer: PPO | Admitting: Family Medicine

## 2022-07-17 ENCOUNTER — Encounter: Payer: Self-pay | Admitting: Family Medicine

## 2022-07-17 ENCOUNTER — Encounter: Payer: Self-pay | Admitting: Obstetrics and Gynecology

## 2022-07-17 VITALS — BP 124/76 | HR 95 | Temp 98.8°F | Ht 66.25 in | Wt 185.4 lb

## 2022-07-17 DIAGNOSIS — I152 Hypertension secondary to endocrine disorders: Secondary | ICD-10-CM

## 2022-07-17 DIAGNOSIS — I7090 Unspecified atherosclerosis: Secondary | ICD-10-CM | POA: Diagnosis not present

## 2022-07-17 DIAGNOSIS — Z794 Long term (current) use of insulin: Secondary | ICD-10-CM

## 2022-07-17 DIAGNOSIS — E7800 Pure hypercholesterolemia, unspecified: Secondary | ICD-10-CM

## 2022-07-17 DIAGNOSIS — E1159 Type 2 diabetes mellitus with other circulatory complications: Secondary | ICD-10-CM | POA: Diagnosis not present

## 2022-07-17 DIAGNOSIS — E1165 Type 2 diabetes mellitus with hyperglycemia: Secondary | ICD-10-CM

## 2022-07-17 NOTE — Telephone Encounter (Signed)
PA for Trulicity is needed.  

## 2022-07-17 NOTE — Assessment & Plan Note (Signed)
Chronic, very difficult to control DM with highs and lows frequently fluctuating. To avoid dangerous lows we will decrease her Lantus.  She was doing better when she was on Trulicity and we will look into what is holding up the prior authorization.  Given her labile brittle diabetes I will go ahead and move forward with a referral to endocrine.

## 2022-07-17 NOTE — Progress Notes (Unsigned)
Patient ID: Jean Robinson, female    DOB: 15-Jul-1955, 67 y.o.   MRN: [id number]  This visit was conducted in person.  LMP 02/02/2006    CC:  No chief complaint on file.   Subjective:   HPI: Jean Robinson is a 67 y.o. female presenting on 07/17/2022 for No chief complaint on file.  Emergency department visit from February 27, 2022 for acute diarrhea. Occurred after starting low-dose Ozempic for the first time.  2 days following initial injection she started with profound vomiting followed by  explosive diarrhea (20-30 times a day).Today she reports  she has remained off ozempic and her symptoms have resolved.  Diabetes: At last OV discussed trial of re-trying GLP1 med... pharmacy said that Trulicity needs an approval.   She is here today given lows this last week. Happened after a meal sugar spiked to 300 did not take SSI... later that night 1 AM dropped to 50. Treated with sugar , but had second drop later.  No new infection, no fever.   FBS 120s typically.  Lantus 40-42 Units daily, now down at 39 Novolog SSI, glucophage XR 500 mg Max dose. Lab Results  Component Value Date   HGBA1C 9.3 (H) 06/25/2022  Using medications without difficulties: Hypoglycemic episodes:  yes Hyperglycemic episodes: afternoon Feet problems: none Blood Sugars averaging: CGM:   FBS 140-160 in last  2-3 weeks eye exam within last year: yes  She is willing to try Mounjaro or trulicity.  Wt Readings from Last 3 Encounters:  07/01/22 185 lb 4 oz (84 kg)  04/29/22 189 lb (85.7 kg)  04/20/22 189 lb (85.7 kg)   Mammogram showed some arterial breast calcification... could me increase risk of CAD.  Relevant past medical, surgical, family and social history reviewed and updated as indicated. Interim medical history since our last visit reviewed. Allergies and medications reviewed and updated. Outpatient Medications Prior to Visit  Medication Sig Dispense Refill   alendronate (FOSAMAX) 70 MG tablet TAKE 1  TABLET (70 MG TOTAL) BY MOUTH ONCE A WEEK 12 tablet 3   B-D ULTRAFINE III SHORT PEN 31G X 8 MM MISC USE 1 PEN AS DIRECTED 100 each 3   Blood Glucose Monitoring Suppl (ONETOUCH VERIO REFLECT) w/Device KIT Check blood sugar twice a day and as directed. Dx E11.65 1 kit 0   cetirizine (ZYRTEC) 10 MG tablet Take 1 tablet (10 mg total) by mouth daily. 30 tablet 11   colchicine 0.6 MG tablet Take 1 tablet (0.6 mg total) by mouth 2 (two) times daily as needed. 180 tablet 0   Continuous Blood Gluc Receiver (FREESTYLE LIBRE 2 READER) DEVI Use to check blood sugar continuous due to low blood sugars at night.  Updated Rx 1 each 0   Continuous Blood Gluc Sensor (FREESTYLE LIBRE 2 SENSOR) MISC USE TO CHECK BLOOD SUGAR CONTINUOUS DUE TO LOW BLOOD SUGARS AT NIGHT. UPDATED RX 2 each 11   Dulaglutide (TRULICITY) 0.75 MG/0.5ML SOPN Inject 0.75 mg into the skin once a week. 2 mL 0   fluticasone (FLONASE) 50 MCG/ACT nasal spray SPRAY 2 SPRAYS INTO EACH NOSTRIL EVERY DAY 48 mL 3   insulin aspart (NOVOLOG FLEXPEN) 100 UNIT/ML FlexPen USING SLIDING SCALE TO DETERMINE MEALTIME DOSE SUBCUTANEOUS INJECTION, MAX DOSE PER AY 20 UNITS 15 mL 3   insulin glargine (LANTUS SOLOSTAR) 100 UNIT/ML Solostar Pen Inject 38 Units into the skin daily. 15 mL 11   Lancet Devices (ONE TOUCH DELICA LANCING DEV) MISC Check blood  sugar twice a day and as directed. Dx E11.65 1 each 0   latanoprost (XALATAN) 0.005 % ophthalmic solution Place 1 drop into both eyes at bedtime.      lisinopril (ZESTRIL) 40 MG tablet Take 1 tablet (40 mg total) by mouth daily. 90 tablet 3   loperamide (IMODIUM) 2 MG capsule Take 2 mg by mouth as needed.     metFORMIN (GLUCOPHAGE-XR) 500 MG 24 hr tablet TAKE 2 TABLETS BY MOUTH TWICE A DAY 360 tablet 1   nitroGLYCERIN (NITRODUR - DOSED IN MG/24 HR) 0.2 mg/hr patch Place 1 patch (0.2 mg total) onto the skin daily. Apply to Achilles tendon 30 patch 0   ONETOUCH VERIO test strip USE TO CHECK BLOOD SUGAR THREE TIMES DAILY.  DX: E11.65 100 strip 11   pravastatin (PRAVACHOL) 40 MG tablet TAKE 1 TABLET BY MOUTH EVERY DAY 90 tablet 3   sertraline (ZOLOFT) 25 MG tablet Take 1 tablet (25 mg total) by mouth daily. 90 tablet 1   SYSTANE ULTRA 0.4-0.3 % SOLN SMARTSIG:1 Drop(s) In Eye(s) As Needed     valACYclovir (VALTREX) 500 MG tablet TAKE 1 TABLET BY MOUTH 2 TIMES DAILY, TAKE FOR 3 DAYS FOR ACUTE FLARE 180 tablet 0   No facility-administered medications prior to visit.     Per HPI unless specifically indicated in ROS section below Review of Systems  Constitutional:  Negative for fatigue and fever.  HENT:  Negative for congestion.   Eyes:  Negative for pain.  Respiratory:  Negative for cough and shortness of breath.   Cardiovascular:  Negative for chest pain, palpitations and leg swelling.  Gastrointestinal:  Negative for abdominal pain.  Genitourinary:  Negative for dysuria and vaginal bleeding.  Musculoskeletal:  Negative for back pain.  Neurological:  Negative for syncope, light-headedness and headaches.  Psychiatric/Behavioral:  Negative for dysphoric mood.    Objective:  LMP 02/02/2006   Wt Readings from Last 3 Encounters:  07/01/22 185 lb 4 oz (84 kg)  04/29/22 189 lb (85.7 kg)  04/20/22 189 lb (85.7 kg)      Physical Exam Constitutional:      General: She is not in acute distress.    Appearance: Normal appearance. She is well-developed. She is not ill-appearing or toxic-appearing.  HENT:     Head: Normocephalic.     Right Ear: Hearing, tympanic membrane, ear canal and external ear normal. Tympanic membrane is not erythematous, retracted or bulging.     Left Ear: Hearing, tympanic membrane, ear canal and external ear normal. Tympanic membrane is not erythematous, retracted or bulging.     Nose: No mucosal edema or rhinorrhea.     Right Sinus: No maxillary sinus tenderness or frontal sinus tenderness.     Left Sinus: No maxillary sinus tenderness or frontal sinus tenderness.     Mouth/Throat:      Pharynx: Uvula midline.  Eyes:     General: Lids are normal. Lids are everted, no foreign bodies appreciated.     Conjunctiva/sclera: Conjunctivae normal.     Pupils: Pupils are equal, round, and reactive to light.  Neck:     Thyroid: No thyroid mass or thyromegaly.     Vascular: No carotid bruit.     Trachea: Trachea normal.  Cardiovascular:     Rate and Rhythm: Normal rate and regular rhythm.     Pulses: Normal pulses.     Heart sounds: Normal heart sounds, S1 normal and S2 normal. No murmur heard.    No friction rub.  No gallop.  Pulmonary:     Effort: Pulmonary effort is normal. No tachypnea or respiratory distress.     Breath sounds: Normal breath sounds. No decreased breath sounds, wheezing, rhonchi or rales.  Abdominal:     General: Bowel sounds are normal.     Palpations: Abdomen is soft.     Tenderness: There is no abdominal tenderness.  Musculoskeletal:     Cervical back: Normal range of motion and neck supple.  Skin:    General: Skin is warm and dry.     Findings: No rash.  Neurological:     Mental Status: She is alert.  Psychiatric:        Mood and Affect: Mood is not anxious or depressed.        Speech: Speech normal.        Behavior: Behavior normal. Behavior is cooperative.        Thought Content: Thought content normal.        Judgment: Judgment normal.       Results for orders placed or performed in visit on 07/16/22  HM DEXA SCAN  Result Value Ref Range   HM Dexa Scan Osteopenia     Assessment and Plan  There are no diagnoses linked to this encounter.   No follow-ups on file.   Kerby Nora, MD

## 2022-07-17 NOTE — Patient Instructions (Addendum)
We will go ahead and move forward with ENDO referral.  Decrease the Lantus to  36 units daily. We will look into the status  of Trulicity prior auth.

## 2022-07-20 DIAGNOSIS — Z961 Presence of intraocular lens: Secondary | ICD-10-CM | POA: Diagnosis not present

## 2022-07-20 NOTE — Telephone Encounter (Signed)
PA submitted 06/14 and is pending determination  Key: [id number]

## 2022-07-22 NOTE — Assessment & Plan Note (Signed)
Chronic, now well-controlled, off meloxicam.    Continue lisinopril 40 mg p.o. daily.  

## 2022-07-22 NOTE — Assessment & Plan Note (Signed)
Noted on breast imaging.  Equivalent to cardiac disease risk factor.  She is currently on pravastatin 40 mg daily but I encouraged her to increase to moderate to high dose statin such as atorvastatin 40 mg daily.  I also recommended referral to cardiology for further evaluation and stress testing of her heart.

## 2022-07-22 NOTE — Assessment & Plan Note (Signed)
Stable, chronic.  Continue current medication.  Not at new goal less than 70 on pravastatin 40 mg p.o. daily.  Recommended increased dose to moderate high dose of atorvastatin.  She is hesitant and will consider. Lab Results  Component Value Date   CHOL 172 02/06/2022   HDL 54.60 02/06/2022   LDLCALC 99 02/06/2022   TRIG 93.0 02/06/2022   CHOLHDL 3 02/06/2022

## 2022-07-27 ENCOUNTER — Other Ambulatory Visit (HOSPITAL_COMMUNITY): Payer: Self-pay

## 2022-07-29 ENCOUNTER — Encounter: Payer: Self-pay | Admitting: *Deleted

## 2022-08-03 ENCOUNTER — Telehealth: Payer: Self-pay

## 2022-08-03 NOTE — Telephone Encounter (Signed)
Trulicity approved with [employer]. Effective: 07/27/22-07/27/23 Approval letter attached to charts

## 2022-08-25 DIAGNOSIS — I152 Hypertension secondary to endocrine disorders: Secondary | ICD-10-CM | POA: Diagnosis not present

## 2022-08-25 DIAGNOSIS — E119 Type 2 diabetes mellitus without complications: Secondary | ICD-10-CM | POA: Diagnosis not present

## 2022-08-25 DIAGNOSIS — E785 Hyperlipidemia, unspecified: Secondary | ICD-10-CM | POA: Diagnosis not present

## 2022-08-25 DIAGNOSIS — E1159 Type 2 diabetes mellitus with other circulatory complications: Secondary | ICD-10-CM | POA: Diagnosis not present

## 2022-08-25 DIAGNOSIS — E1169 Type 2 diabetes mellitus with other specified complication: Secondary | ICD-10-CM | POA: Diagnosis not present

## 2022-08-25 DIAGNOSIS — E1165 Type 2 diabetes mellitus with hyperglycemia: Secondary | ICD-10-CM | POA: Diagnosis not present

## 2022-08-25 DIAGNOSIS — Z794 Long term (current) use of insulin: Secondary | ICD-10-CM | POA: Diagnosis not present

## 2022-08-31 ENCOUNTER — Telehealth: Payer: Self-pay | Admitting: Family Medicine

## 2022-08-31 ENCOUNTER — Other Ambulatory Visit: Payer: Self-pay | Admitting: Family Medicine

## 2022-08-31 DIAGNOSIS — E1165 Type 2 diabetes mellitus with hyperglycemia: Secondary | ICD-10-CM

## 2022-08-31 MED ORDER — ONETOUCH DELICA LANCING DEV MISC
0 refills | Status: AC
Start: 2022-08-31 — End: ?

## 2022-08-31 MED ORDER — ONETOUCH DELICA LANCETS 33G MISC
0 refills | Status: DC
Start: 2022-08-31 — End: 2022-10-07

## 2022-08-31 MED ORDER — ONETOUCH VERIO VI STRP
ORAL_STRIP | 0 refills | Status: DC
Start: 2022-08-31 — End: 2022-12-11

## 2022-08-31 NOTE — Telephone Encounter (Signed)
Prescription Request  08/31/2022  LOV: 07/17/2022  What is the name of the medication or equipment? Lancet Devices (ONE TOUCH DELICA LANCING DEV) MISC   ONETOUCH VERIO test strip   Have you contacted your pharmacy to request a refill? No   Which pharmacy would you like this sent to?   [id number] - [place], [place] - [address] [place] [address] Phone: [phone] Fax: [phone]    Patient notified that their request is being sent to the clinical staff for review and that they should receive a response within 2 business days.   Please advise at Mobile [phone] (mobile)  Patient is out of town, freestyle has topped working and she is manually checking blood sugar, states she is in need of both of these

## 2022-08-31 NOTE — Telephone Encounter (Signed)
Refills sent as requested

## 2022-09-09 ENCOUNTER — Other Ambulatory Visit: Payer: Self-pay | Admitting: Family Medicine

## 2022-09-09 DIAGNOSIS — I10 Essential (primary) hypertension: Secondary | ICD-10-CM

## 2022-09-09 NOTE — Telephone Encounter (Signed)
Spoke to pt, pt stated they'd return our call to schedule appts.

## 2022-09-09 NOTE — Telephone Encounter (Signed)
Please schedule Medicare Wellness with nurse and CPE with fasting labs prior with Dr. Ermalene Searing after 09/27/2022.

## 2022-09-10 ENCOUNTER — Other Ambulatory Visit: Payer: Self-pay | Admitting: Family Medicine

## 2022-09-21 ENCOUNTER — Other Ambulatory Visit: Payer: Self-pay | Admitting: Family Medicine

## 2022-09-24 ENCOUNTER — Ambulatory Visit: Payer: PPO | Admitting: Family Medicine

## 2022-09-24 ENCOUNTER — Ambulatory Visit (INDEPENDENT_AMBULATORY_CARE_PROVIDER_SITE_OTHER): Payer: PPO | Admitting: Family Medicine

## 2022-09-24 ENCOUNTER — Encounter: Payer: Self-pay | Admitting: Family Medicine

## 2022-09-24 VITALS — BP 170/100 | HR 90 | Temp 97.7°F | Ht 66.25 in | Wt 182.0 lb

## 2022-09-24 DIAGNOSIS — K219 Gastro-esophageal reflux disease without esophagitis: Secondary | ICD-10-CM | POA: Diagnosis not present

## 2022-09-24 DIAGNOSIS — R5383 Other fatigue: Secondary | ICD-10-CM | POA: Diagnosis not present

## 2022-09-24 DIAGNOSIS — R197 Diarrhea, unspecified: Secondary | ICD-10-CM

## 2022-09-24 DIAGNOSIS — L918 Other hypertrophic disorders of the skin: Secondary | ICD-10-CM | POA: Insufficient documentation

## 2022-09-24 NOTE — Progress Notes (Signed)
Patient ID: Jean Robinson, female    DOB: 1955-07-25, 67 y.o.   MRN: [id number]  This visit was conducted in person.  BP (!) 170/100   Pulse 90   Temp 97.7 F (36.5 C)   Ht 5' 6.25" (1.683 m)   Wt 182 lb (82.6 kg)   LMP 02/02/2006   SpO2 99%   BMI 29.15 kg/m    CC:  Chief Complaint  Patient presents with   Gastroesophageal Reflux   Fatigue   Abdominal Pain    Subjective:   HPI: Jean Robinson is a 67 y.o. female presenting on 09/24/2022 for Gastroesophageal Reflux, Fatigue, and Abdominal Pain  Recent referral to endocrinology for poorly controlled diabetes.  She was seen at [facility] on August 25, 2022  Metformin was continued, basal insulin dose  34 Units was lowered, sliding scale insulin was increased to 6 to 18 units with meals.  She was started on low-dose Trulicity.  Today she reports she started Trulicity 09/04/2022.Marland KitchenMarland KitchenMarland Kitchen 3-4 days later had constipation, mild. Progressed to nausea and severe heartburn and indigestion. Used 2 Tums every few hours.  She then stopped Trulicity ( took 1 dose total) Started probiotic citrucel. Noted decreased energy, sleeping more.   Took COVID test 8/11.. negative   She has had resolution of indigestion, nausea and  Until today she has explosive diarrhea, gas.  Mild abdominal discomfort in last hour,at umbilicus. Decreased appetite   She has tried to decrease creamer, caffeine, greasy foods.  Had similar explosive diarrhea after using GLP1 in 02/2022  Took 2 immodium this morning.. no further diarrhea.  No blood loss.   No further blood sugar lows on lower dose of   She does have loose stool about  2 times a week  for last several month.   She reports  an irritate skin tag on left upper arm and right upper neck.  Relevant past medical, surgical, family and social history reviewed and updated as indicated. Interim medical history since our last visit reviewed. Allergies and medications reviewed and updated. Outpatient Medications  Prior to Visit  Medication Sig Dispense Refill   alendronate (FOSAMAX) 70 MG tablet TAKE 1 TABLET (70 MG TOTAL) BY MOUTH ONCE A WEEK 12 tablet 3   B-D ULTRAFINE III SHORT PEN 31G X 8 MM MISC USE 1 PEN AS DIRECTED 100 each 3   Blood Glucose Monitoring Suppl (ONETOUCH VERIO REFLECT) w/Device KIT Check blood sugar twice a day and as directed. Dx E11.65 1 kit 0   cetirizine (ZYRTEC) 10 MG tablet Take 1 tablet (10 mg total) by mouth daily. 30 tablet 11   colchicine 0.6 MG tablet Take 1 tablet (0.6 mg total) by mouth 2 (two) times daily as needed. 180 tablet 0   Continuous Blood Gluc Receiver (FREESTYLE LIBRE 2 READER) DEVI Use to check blood sugar continuous due to low blood sugars at night.  Updated Rx 1 each 0   Continuous Blood Gluc Sensor (FREESTYLE LIBRE 2 SENSOR) MISC USE TO CHECK BLOOD SUGAR CONTINUOUS DUE TO LOW BLOOD SUGARS AT NIGHT. UPDATED RX 2 each 11   fluticasone (FLONASE) 50 MCG/ACT nasal spray SPRAY 2 SPRAYS INTO EACH NOSTRIL EVERY DAY 48 mL 3   glucose blood (ONETOUCH VERIO) test strip Use to check blood sugar twice daily 100 strip 0   insulin aspart (NOVOLOG FLEXPEN) 100 UNIT/ML FlexPen USING SLIDING SCALE TO DETERMINE MEALTIME DOSE SUBCUTANEOUS INJECTION, MAX DOSE PER AY 20 UNITS 15 mL 3   insulin  glargine (LANTUS SOLOSTAR) 100 UNIT/ML Solostar Pen Inject 38 Units into the skin daily. 15 mL 11   Lancet Devices (ONE TOUCH DELICA LANCING DEV) MISC Check blood sugar twice a day.  Dx E11.65 1 each 0   latanoprost (XALATAN) 0.005 % ophthalmic solution Place 1 drop into both eyes at bedtime.      lisinopril (ZESTRIL) 40 MG tablet TAKE 1 TABLET BY MOUTH EVERY DAY 90 tablet 1   loperamide (IMODIUM) 2 MG capsule Take 2 mg by mouth as needed.     metFORMIN (GLUCOPHAGE-XR) 500 MG 24 hr tablet TAKE 2 TABLETS BY MOUTH TWICE A DAY 360 tablet 1   nitroGLYCERIN (NITRODUR - DOSED IN MG/24 HR) 0.2 mg/hr patch Place 1 patch (0.2 mg total) onto the skin daily. Apply to Achilles tendon 30 patch 0    OneTouch Delica Lancets 33G MISC Use to check blood sugar twice daily 100 each 0   pravastatin (PRAVACHOL) 40 MG tablet TAKE 1 TABLET BY MOUTH EVERY DAY 90 tablet 3   prednisoLONE acetate (PRED FORTE) 1 % ophthalmic suspension Place 1 drop into the left eye 4 (four) times daily.     sertraline (ZOLOFT) 25 MG tablet TAKE 1 TABLET (25 MG TOTAL) BY MOUTH DAILY. 90 tablet 1   SYSTANE ULTRA 0.4-0.3 % SOLN SMARTSIG:1 Drop(s) In Eye(s) As Needed     valACYclovir (VALTREX) 500 MG tablet TAKE 1 TABLET BY MOUTH 2 TIMES DAILY, TAKE FOR 3 DAYS FOR ACUTE FLARE 180 tablet 0   Dulaglutide (TRULICITY) 0.75 MG/0.5ML SOPN INJECT 0.75 MG INTO THE SKIN ONCE A WEEK (Patient not taking: Reported on 09/24/2022) 2 mL 2   No facility-administered medications prior to visit.     Per HPI unless specifically indicated in ROS section below Review of Systems  Constitutional:  Negative for fatigue and fever.  HENT:  Negative for congestion.   Eyes:  Negative for pain.  Respiratory:  Negative for cough and shortness of breath.   Cardiovascular:  Negative for chest pain, palpitations and leg swelling.  Gastrointestinal:  Negative for abdominal pain.  Genitourinary:  Negative for dysuria and vaginal bleeding.  Musculoskeletal:  Negative for back pain.  Neurological:  Negative for syncope, light-headedness and headaches.  Psychiatric/Behavioral:  Negative for dysphoric mood.    Objective:  BP (!) 170/100   Pulse 90   Temp 97.7 F (36.5 C)   Ht 5' 6.25" (1.683 m)   Wt 182 lb (82.6 kg)   LMP 02/02/2006   SpO2 99%   BMI 29.15 kg/m   Wt Readings from Last 3 Encounters:  09/24/22 182 lb (82.6 kg)  07/17/22 185 lb 6.4 oz (84.1 kg)  07/01/22 185 lb 4 oz (84 kg)      Physical Exam Constitutional:      General: She is not in acute distress.    Appearance: Normal appearance. She is well-developed. She is not ill-appearing or toxic-appearing.  HENT:     Head: Normocephalic.     Right Ear: Hearing, tympanic membrane,  ear canal and external ear normal. Tympanic membrane is not erythematous, retracted or bulging.     Left Ear: Hearing, tympanic membrane, ear canal and external ear normal. Tympanic membrane is not erythematous, retracted or bulging.     Nose: No mucosal edema or rhinorrhea.     Right Sinus: No maxillary sinus tenderness or frontal sinus tenderness.     Left Sinus: No maxillary sinus tenderness or frontal sinus tenderness.     Mouth/Throat:  Mouth: Oropharynx is clear and moist and mucous membranes are normal.     Pharynx: Uvula midline.  Eyes:     General: Lids are normal. Lids are everted, no foreign bodies appreciated.     Extraocular Movements: EOM normal.     Conjunctiva/sclera: Conjunctivae normal.     Pupils: Pupils are equal, round, and reactive to light.  Neck:     Thyroid: No thyroid mass or thyromegaly.     Vascular: No carotid bruit.     Trachea: Trachea normal.  Cardiovascular:     Rate and Rhythm: Normal rate and regular rhythm.     Pulses: Normal pulses.     Heart sounds: Normal heart sounds, S1 normal and S2 normal. No murmur heard.    No friction rub. No gallop.  Pulmonary:     Effort: Pulmonary effort is normal. No tachypnea or respiratory distress.     Breath sounds: Normal breath sounds. No decreased breath sounds, wheezing, rhonchi or rales.  Abdominal:     General: Bowel sounds are normal.     Palpations: Abdomen is soft.     Tenderness: There is no abdominal tenderness.  Musculoskeletal:     Cervical back: Normal range of motion and neck supple.  Skin:    General: Skin is warm, dry and intact.     Findings: No rash.     Comments: Irritated skin tag on left upper arm and right upper back  Neurological:     Mental Status: She is alert.  Psychiatric:        Mood and Affect: Mood is not anxious or depressed.        Speech: Speech normal.        Behavior: Behavior normal. Behavior is cooperative.        Thought Content: Thought content normal.         Cognition and Memory: Cognition and memory normal.        Judgment: Judgment normal.       Results for orders placed or performed in visit on 07/16/22  HM DEXA SCAN  Result Value Ref Range   HM Dexa Scan Osteopenia     Assessment and Plan  Acute diarrhea Assessment & Plan: Acute 1 episode of explosive diarrhea this morning.  She has had some increase in loose stool twice a week long-term. Will evaluate with labs for secondary causes. I wonder if the explosive diarrhea could be related to her recent use of GLP-1 although it has been 3 weeks from the last dose.  She has stopped this medication completely. Continue probiotic and push fluids.  If symptoms are persistent we can consider stool studies but there is no clear sign of infectious etiology at this time.   Gastroesophageal reflux disease, unspecified whether esophagitis present Assessment & Plan: Acute, now resolved Noted immediately after taking starting dose of Trulicity.  Likely medication side effect.   Other fatigue Assessment & Plan: Acute, unclear cause.  Will evaluate with lab.  Improved control of blood sugar.  Orders: -     Comprehensive metabolic panel -     CBC with Differential/Platelet -     Lipase -     TSH -     Vitamin B12 -     CBC with Differential/Platelet  Inflamed skin tag Assessment & Plan: Procedure Note for Cryotherapy Dx: Inflamed skin tag Location: Left upper arm and right upper neck Size: Both less than 0.5 cm Patient gave consent for procedure . Patient notified of  potential for scarring at site of cryotherapy. Area of concern treated with cryotherapy for 3 freeze thaw cycles with a 1 mm border of freeze. No complications.      No follow-ups on file.   Kerby Nora, MD

## 2022-09-24 NOTE — Addendum Note (Signed)
Addended by: Alvina Chou on: 09/24/2022 02:52 PM   Modules accepted: Orders

## 2022-09-24 NOTE — Assessment & Plan Note (Signed)
Acute, unclear cause.  Will evaluate with lab.  Improved control of blood sugar.

## 2022-09-24 NOTE — Assessment & Plan Note (Signed)
Acute, now resolved Noted immediately after taking starting dose of Trulicity.  Likely medication side effect.

## 2022-09-24 NOTE — Patient Instructions (Signed)
Please stop at the lab to have labs drawn.   Continue probiotic.  Can use immodium 3 times daily.  Keep  up with fluids.

## 2022-09-24 NOTE — Assessment & Plan Note (Signed)
Acute 1 episode of explosive diarrhea this morning.  She has had some increase in loose stool twice a week long-term. Will evaluate with labs for secondary causes. I wonder if the explosive diarrhea could be related to her recent use of GLP-1 although it has been 3 weeks from the last dose.  She has stopped this medication completely. Continue probiotic and push fluids.  If symptoms are persistent we can consider stool studies but there is no clear sign of infectious etiology at this time.

## 2022-09-24 NOTE — Assessment & Plan Note (Signed)
Procedure Note for Cryotherapy Dx: Inflamed skin tag Location: Left upper arm and right upper neck Size: Both less than 0.5 cm Patient gave consent for procedure . Patient notified of potential for scarring at site of cryotherapy. Area of concern treated with cryotherapy for 3 freeze thaw cycles with a 1 mm border of freeze. No complications.

## 2022-09-25 LAB — COMPREHENSIVE METABOLIC PANEL
ALT: 13 U/L (ref 0–35)
AST: 14 U/L (ref 0–37)
Albumin: 4.2 g/dL (ref 3.5–5.2)
Alkaline Phosphatase: 75 U/L (ref 39–117)
BUN: 32 mg/dL — ABNORMAL HIGH (ref 6–23)
CO2: 30 mEq/L (ref 19–32)
Calcium: 12.1 mg/dL — ABNORMAL HIGH (ref 8.4–10.5)
Chloride: 99 meq/L (ref 96–112)
Creatinine, Ser: 1.34 mg/dL — ABNORMAL HIGH (ref 0.40–1.20)
GFR: 40.57 mL/min — ABNORMAL LOW (ref >60.00)
Glucose, Bld: 176 mg/dL — ABNORMAL HIGH (ref 70–99)
Potassium: 4.3 meq/L (ref 3.5–5.1)
Sodium: 137 meq/L (ref 135–145)
Total Bilirubin: 0.5 mg/dL (ref 0.2–1.2)
Total Protein: 7 g/dL (ref 6.0–8.3)

## 2022-09-25 LAB — CBC WITH DIFFERENTIAL/PLATELET
Basophils Absolute: 0.1 10*3/uL (ref 0.0–0.1)
Basophils Relative: 0.7 % (ref 0.0–3.0)
Eosinophils Absolute: 0.3 10*3/uL (ref 0.0–0.7)
Eosinophils Relative: 2.4 % (ref 0.0–5.0)
HCT: 39 % (ref 36.0–46.0)
Hemoglobin: 13.1 g/dL (ref 12.0–15.0)
Lymphocytes Relative: 41.7 % (ref 12.0–46.0)
Lymphs Abs: 4.4 10*3/uL — ABNORMAL HIGH (ref 0.7–4.0)
MCHC: 33.5 g/dL (ref 30.0–36.0)
MCV: 94.5 fl (ref 78.0–100.0)
Monocytes Absolute: 0.8 10*3/uL (ref 0.1–1.0)
Monocytes Relative: 7.6 % (ref 3.0–12.0)
Neutro Abs: 5.1 10*3/uL (ref 1.4–7.7)
Neutrophils Relative %: 47.6 % (ref 43.0–77.0)
Platelets: 282 10*3/uL (ref 150.0–400.0)
RBC: 4.13 Mil/uL (ref 3.87–5.11)
RDW: 12.5 % (ref 11.5–15.5)
WBC: 10.6 10*3/uL — ABNORMAL HIGH (ref 4.0–10.5)

## 2022-09-25 LAB — LIPASE: Lipase: 69 U/L — ABNORMAL HIGH (ref 11.0–59.0)

## 2022-09-25 LAB — VITAMIN B12: Vitamin B-12: 294 pg/mL (ref 211–911)

## 2022-09-25 LAB — TSH: TSH: 3.25 u[IU]/mL (ref 0.35–5.50)

## 2022-10-07 ENCOUNTER — Ambulatory Visit: Payer: PPO | Attending: Internal Medicine | Admitting: Internal Medicine

## 2022-10-07 ENCOUNTER — Encounter: Payer: Self-pay | Admitting: Internal Medicine

## 2022-10-07 VITALS — BP 163/94 | HR 81 | Ht 66.5 in | Wt 185.8 lb

## 2022-10-07 DIAGNOSIS — I7090 Unspecified atherosclerosis: Secondary | ICD-10-CM

## 2022-10-07 DIAGNOSIS — I10 Essential (primary) hypertension: Secondary | ICD-10-CM | POA: Diagnosis not present

## 2022-10-07 NOTE — Patient Instructions (Signed)
Medication Instructions:   *If you need a refill on your cardiac medications before your next appointment, please call your pharmacy*   Lab Work: BMET, URIC ACID, HGBA1C, NMR, APO B, LIPO A If you have labs (blood work) drawn today and your tests are completely normal, you will receive your results only by: MyChart Message (if you have MyChart) OR A paper copy in the mail If you have any lab test that is abnormal or we need to change your treatment, we will call you to review the results.   Testing/Procedures:  How to Prepare for Your Cardiac PET/CT Stress Test:  1. Please do not take these medications before your test:   You may take your medications with water.  2. Nothing to eat or drink, except water, 3 hours prior to arrival time.   NO caffeine/decaffeinated products, or chocolate 12 hours prior to arrival.  3. NO perfume, cologne or lotion on chest or abdomen area.          - FEMALES - Please avoid wearing dresses to this appointment.  4. Total time is 1 to 2 hours; you may want to bring reading material for the waiting time.  5. Please report to Radiology at the [facility] Main Entrance 30 minutes early for your test.  [address] [place], [place] [address]  6. Please report to Radiology at [facility] Main Entrance, medical mall, 30 mins prior to your test.  [address]  [place], [place]  [phone]  Diabetic Preparation:  Hold oral medications. You may take NPH and Lantus insulin. Do not take Humalog or Humulin R (Regular Insulin) the day of your test. Check blood sugars prior to leaving the house. If able to eat breakfast prior to 3 hour fasting, you may take all medications, including your insulin, Do not worry if you miss your breakfast dose of insulin - start at your next meal. Patients who wear a continuous glucose monitor MUST remove the device prior to scanning.  In preparation for your appointment,  medication and supplies will be purchased.  Appointment availability is limited, so if you need to cancel or reschedule, please call the Radiology Department at [phone] [facility]) OR [phone] [facility])  24 hours in advance to avoid a cancellation fee of $100.00  What to Expect After you Arrive:  Once you arrive and check in for your appointment, you will be taken to a preparation room within the Radiology Department.  A technologist or Nurse will obtain your medical history, verify that you are correctly prepped for the exam, and explain the procedure.  Afterwards,  an IV will be started in your arm and electrodes will be placed on your skin for EKG monitoring during the stress portion of the exam. Then you will be escorted to the PET/CT scanner.  There, staff will get you positioned on the scanner and obtain a blood pressure and EKG.  During the exam, you will continue to be connected to the EKG and blood pressure machines.  A small, safe amount of a radioactive tracer will be injected in your IV to obtain a series of pictures of your heart along with an injection of a stress agent.    After your Exam:  It is recommended that you eat a meal and drink a caffeinated beverage to counter act any effects of the stress agent.  Drink plenty of fluids for the remainder of the day and urinate frequently for the first couple  of hours after the exam.  Your doctor will inform you of your test results within 7-10 business days.  For more information and frequently asked questions, please visit our website : [email or web address]  For questions about your test or how to prepare for your test, please call: Cardiac Imaging Nurse Navigators Office: [phone]    Follow-Up: At [facility], you and your health needs are our priority.  As part of our continuing mission to provide you with exceptional heart care, we have created designated Provider Care Teams.  These Care Teams  include your primary Cardiologist (physician) and Advanced Practice Providers (APPs -  Physician Assistants and Nurse Practitioners) who all work together to provide you with the care you need, when you need it.  We recommend signing up for the patient portal called "MyChart".  Sign up information is provided on this After Visit Summary.  MyChart is used to connect with patients for Virtual Visits (Telemedicine).  Patients are able to view lab/test results, encounter notes, upcoming appointments, etc.  Non-urgent messages can be sent to your provider as well.   To learn more about what you can do with MyChart, go to [email or web address].    Your next appointment: ONE MONTH WITH THE HTN CLINIC/ PHARM D

## 2022-10-07 NOTE — Progress Notes (Signed)
Cardiology Office Note   Date:  10/07/2022   ID:  Jean Robinson, DOB 05-Oct-1955, MRN [id number]  PCP:  Excell Seltzer, MD  Cardiologist:   Dietrich Pates, MD   Pt referred for evaluation of CAD     History of Present Illness: Jean Robinson is a 67 y.o. female with a history of  T2DM (had since age 24) and  HTN   L(n meds for 8 years) for HTN     The pt recently had a mammogram done   This showed calcification of breast vessls REferred to cardiology    The pt is active  She does not exercise or walk regularly for fitness   But she helps care for grandchildren, she gardens   Just got [place] She denies CP  Breathing is ok with activity now   Does get some SOB on [place] wh[place]ich is more exercise   No palpitation She has kept a BP log for a few wieeks  BP is running higher  150s to 170s/   Note:   Tried Ozempic   Dehydrated  Hospitalized   Trulicity tried   Had bad side effect.  Had bad side effect from this as well      Diet: Br:   Tea with 1 tsp sugar or nothng;    Small bowl of special K and blue berries  2% milk Lunch:   Sandwich, cheese and crackers  Ch[employer]ani Vanilla or fruit   Water Afternoon   Varies Snacks on Cheetos or cookies or ice cream  Water Dinner: Sandwich  Little red meat   Salad   Chicken Protein or fiber before Bed    Current Meds  Medication Sig   alendronate (FOSAMAX) 70 MG tablet TAKE 1 TABLET (70 MG TOTAL) BY MOUTH ONCE A WEEK   B-D ULTRAFINE III SHORT PEN 31G X 8 MM MISC USE 1 PEN AS DIRECTED   Blood Glucose Monitoring Suppl (ONETOUCH VERIO REFLECT) w/Device KIT Check blood sugar twice a day and as directed. Dx E11.65   cetirizine (ZYRTEC) 10 MG tablet Take 1 tablet (10 mg total) by mouth daily.   colchicine 0.6 MG tablet Take 1 tablet (0.6 mg total) by mouth 2 (two) times daily as needed.   Continuous Blood Gluc Receiver (FREESTYLE LIBRE 2 READER) DEVI Use to check blood sugar continuous due to low blood sugars at night.  Updated Rx   Continuous Blood Gluc Sensor  (FREESTYLE LIBRE 2 SENSOR) MISC USE TO CHECK BLOOD SUGAR CONTINUOUS DUE TO LOW BLOOD SUGARS AT NIGHT. UPDATED RX   fluticasone (FLONASE) 50 MCG/ACT nasal spray SPRAY 2 SPRAYS INTO EACH NOSTRIL EVERY DAY   glucose blood (ONETOUCH VERIO) test strip Use to check blood sugar twice daily   insulin aspart (NOVOLOG FLEXPEN) 100 UNIT/ML FlexPen USING SLIDING SCALE TO DETERMINE MEALTIME DOSE SUBCUTANEOUS INJECTION, MAX DOSE PER AY 20 UNITS   insulin glargine (LANTUS SOLOSTAR) 100 UNIT/ML Solostar Pen Inject 38 Units into the skin daily.   Lancet Devices (ONE TOUCH DELICA LANCING DEV) MISC Check blood sugar twice a day.  Dx E11.65   latanoprost (XALATAN) 0.005 % ophthalmic solution Place 1 drop into both eyes at bedtime.    lisinopril (ZESTRIL) 40 MG tablet TAKE 1 TABLET BY MOUTH EVERY DAY   loperamide (IMODIUM) 2 MG capsule Take 2 mg by mouth as needed.   metFORMIN (GLUCOPHAGE-XR) 500 MG 24 hr tablet TAKE 2 TABLETS BY MOUTH TWICE A DAY   nitroGLYCERIN (NITRODUR - DOSED  IN MG/24 HR) 0.2 mg/hr patch Place 1 patch (0.2 mg total) onto the skin daily. Apply to Achilles tendon   pravastatin (PRAVACHOL) 40 MG tablet TAKE 1 TABLET BY MOUTH EVERY DAY   sertraline (ZOLOFT) 25 MG tablet TAKE 1 TABLET (25 MG TOTAL) BY MOUTH DAILY.   SYSTANE ULTRA 0.4-0.3 % SOLN SMARTSIG:1 Drop(s) In Eye(s) As Needed   valACYclovir (VALTREX) 500 MG tablet TAKE 1 TABLET BY MOUTH 2 TIMES DAILY, TAKE FOR 3 DAYS FOR ACUTE FLARE     Allergies:   Patient has no known allergies.   Past Medical History:  Diagnosis Date   Achilles tendonitis    Allergy    Anxiety    Closed fracture of lateral portion of right tibial plateau 07/30/2017   COVID 2021   Depression    Diabetes mellitus    H/O renal calculi 10/08/2009   Hyperlipidemia    Hypertension    Serum calcium elevated    STD (sexually transmitted disease)    HSV I & II, IgG reflex positive   Tibial plateau fracture, right     Past Surgical History:  Procedure Laterality  Date   APPENDECTOMY     CHOLECYSTECTOMY     ORIF TIBIA PLATEAU Right 07/30/2017   Procedure: OPEN REDUCTION INTERNAL FIXATION (ORIF) TIBIAL PLATEAU;  Surgeon: Teryl Lucy, MD;  Location: [facility];  Service: Orthopedics;  Laterality: Right;   TONSILLECTOMY       Social History:  The patient  reports that she has never smoked. She has never used smokeless tobacco. She reports current alcohol use. She reports that she does not use drugs.   Family History:  The patient's family history includes COPD in her mother; Cancer in an other family member; Depression in her father; Diabetes in her brother, father, mother, and another family member; Macular degeneration in her mother.    ROS:  Please see the history of present illness. All other systems are reviewed and  Negative to the above problem except as noted.    PHYSICAL EXAM: VS:  BP (!) 163/94   Pulse 81   Ht 5' 6.5" (1.689 m)   Wt 185 lb 12.8 oz (84.3 kg)   LMP 02/02/2006   SpO2 98%   BMI 29.54 kg/m   GEN: Well nourished, well developed, in no acute distress  HEENT: normal  Neck: no JVD, carotid bruits   Cardiac: RRR; no murmur  No LE  edema  Respiratory:  clear to auscultation bilaterally, GI: soft, nontender, nondistended,   No hepatomegaly  MS: no deformity Skin: warm and dry, no rash Neuro:  Strength and sensation are intact Psych: euthymic mood, full affect   EKG:  EKG is ordered today.  SR 75 bpm  Septal MI   Lipid Panel    Component Value Date/Time   CHOL 172 02/06/2022 0845   TRIG 93.0 02/06/2022 0845   HDL 54.60 02/06/2022 0845   CHOLHDL 3 02/06/2022 0845   VLDL 18.6 02/06/2022 0845   LDLCALC 99 02/06/2022 0845      Wt Readings from Last 3 Encounters:  10/07/22 185 lb 12.8 oz (84.3 kg)  09/24/22 182 lb (82.6 kg)  07/17/22 185 lb 6.4 oz (84.1 kg)      ASSESSMENT AND PLAN:  1  Atherosclerosis   The pt has evid of atherosclerosis on recent mammogram   She has risks for CAD exp with  uncontrolled DM I would recomm, give some SOB with activity and the fact that she  is not too active, setting up for a PET / CT stresss test    Follow   2  HTN  BP is not optimal   Will check labs today   need to get tighter control  Results of blood work will help when deciding what to change Will set up for follow up in HTN clinic to follow response to changes   3  Lipds  LDL  is 99 in Jan 2024   Check lipomed apo B and Lpa      4 DM  Recheck A1C Needs to change diet   Minimally processed foods  Get rid of sugary snacks, sugar, processed carbs     Check BMET and Uric acid     Follow up based on test results      Current medicines are reviewed at length with the patient today.  The patient does not have concerns regarding medicines.  Signed, Dietrich Pates, MD  10/07/2022 9:22 AM    [facility] [address] [place], [place], [place]  [address] Phone: [phone]; Fax: [phone]

## 2022-10-08 LAB — NO SPECIMEN RECEIVED

## 2022-10-10 ENCOUNTER — Encounter: Payer: Self-pay | Admitting: Internal Medicine

## 2022-10-10 DIAGNOSIS — I7090 Unspecified atherosclerosis: Secondary | ICD-10-CM

## 2022-10-10 DIAGNOSIS — Z13220 Encounter for screening for lipoid disorders: Secondary | ICD-10-CM

## 2022-10-12 LAB — BASIC METABOLIC PANEL
BUN/Creatinine Ratio: 19 (ref 12–28)
BUN: 20 mg/dL (ref 8–27)
CO2: 25 mmol/L (ref 20–29)
Calcium: 10.7 mg/dL — ABNORMAL HIGH (ref 8.7–10.3)
Chloride: 102 mmol/L (ref 96–106)
Creatinine, Ser: 1.08 mg/dL — ABNORMAL HIGH (ref 0.57–1.00)
Glucose: 197 mg/dL — ABNORMAL HIGH (ref 70–99)
Potassium: 4.2 mmol/L (ref 3.5–5.2)
Sodium: 138 mmol/L (ref 134–144)
eGFR: 56 mL/min/{1.73_m2} — ABNORMAL LOW (ref >59)

## 2022-10-12 LAB — NMR, LIPOPROFILE

## 2022-10-12 LAB — HEMOGLOBIN A1C
Est. average glucose Bld gHb Est-mCnc: 214 mg/dL
Hgb A1c MFr Bld: 9.1 % — ABNORMAL HIGH (ref 4.8–5.6)

## 2022-10-12 LAB — LIPOPROTEIN A (LPA): Lipoprotein (a): 27 nmol/L (ref <75.0)

## 2022-10-12 LAB — APOLIPOPROTEIN B: Apolipoprotein B: 75 mg/dL (ref <90)

## 2022-10-12 LAB — URIC ACID: Uric Acid: 4.6 mg/dL (ref 3.0–7.2)

## 2022-10-13 ENCOUNTER — Other Ambulatory Visit: Payer: Self-pay

## 2022-10-13 MED ORDER — AMLODIPINE BESYLATE 5 MG PO TABS: 5.0000 mg | ORAL_TABLET | Freq: Every day | ORAL | 3 refills | Status: DC

## 2022-10-16 ENCOUNTER — Other Ambulatory Visit: Payer: Self-pay | Admitting: Family Medicine

## 2022-10-16 DIAGNOSIS — E1165 Type 2 diabetes mellitus with hyperglycemia: Secondary | ICD-10-CM

## 2022-10-20 ENCOUNTER — Encounter: Payer: Self-pay | Admitting: Family Medicine

## 2022-10-20 DIAGNOSIS — H401131 Primary open-angle glaucoma, bilateral, mild stage: Secondary | ICD-10-CM | POA: Diagnosis not present

## 2022-10-21 MED ORDER — FREESTYLE LIBRE 3 PLUS SENSOR MISC: 1.0000 | 3 refills | Status: AC

## 2022-10-21 NOTE — Telephone Encounter (Signed)
Ok to send in updated script as pended

## 2022-11-02 ENCOUNTER — Encounter (HOSPITAL_COMMUNITY): Payer: Self-pay

## 2022-11-04 ENCOUNTER — Ambulatory Visit: Payer: PPO

## 2022-11-04 ENCOUNTER — Encounter (HOSPITAL_COMMUNITY)
Admission: RE | Admit: 2022-11-04 | Discharge: 2022-11-04 | Disposition: A | Discharge status: Home / Self Care | Payer: PPO | Source: Ambulatory Visit | Attending: Internal Medicine | Admitting: Internal Medicine

## 2022-11-04 DIAGNOSIS — I10 Essential (primary) hypertension: Secondary | ICD-10-CM | POA: Diagnosis not present

## 2022-11-04 DIAGNOSIS — I7090 Unspecified atherosclerosis: Secondary | ICD-10-CM | POA: Insufficient documentation

## 2022-11-04 DIAGNOSIS — I7091 Generalized atherosclerosis: Secondary | ICD-10-CM | POA: Diagnosis not present

## 2022-11-04 DIAGNOSIS — I2510 Atherosclerotic heart disease of native coronary artery without angina pectoris: Secondary | ICD-10-CM | POA: Insufficient documentation

## 2022-11-04 LAB — NM PET CT CARDIAC PERFUSION MULTI W/ABSOLUTE BLOODFLOW
LV dias vol: 50 mL (ref 46–106)
LV sys vol: 11 mL
MBFR: 2.64
Nuc Rest EF: 78 %
Nuc Stress EF: 73 %
Peak HR: 112 {beats}/min
Rest HR: 95 {beats}/min
Rest MBF: 1.1 ml/g/min
Rest Nuclear Isotope Dose: 22 mCi
ST Depression (mm): 0 mm
Stress MBF: 2.9 ml/g/min
Stress Nuclear Isotope Dose: 22 mCi

## 2022-11-04 MED ORDER — REGADENOSON 0.4 MG/5ML IV SOLN
0.4000 mg | Freq: Once | INTRAVENOUS | Status: AC
  Administered 2022-11-04: 0.4 mg via INTRAVENOUS

## 2022-11-04 MED ORDER — REGADENOSON 0.4 MG/5ML IV SOLN
INTRAVENOUS | Status: AC
  Filled 2022-11-04: qty 5

## 2022-11-04 MED ORDER — RUBIDIUM RB82 GENERATOR (RUBYFILL)
22.1000 | PACK | Freq: Once | INTRAVENOUS | Status: AC
  Administered 2022-11-04: 22.1 via INTRAVENOUS

## 2022-11-04 MED ORDER — RUBIDIUM RB82 GENERATOR (RUBYFILL)
22.0000 | PACK | Freq: Once | INTRAVENOUS | Status: AC
  Administered 2022-11-04: 22 via INTRAVENOUS

## 2022-11-05 ENCOUNTER — Ambulatory Visit: Payer: PPO

## 2022-11-10 ENCOUNTER — Telehealth: Payer: Self-pay | Admitting: Pharmacist

## 2022-11-10 ENCOUNTER — Other Ambulatory Visit (HOSPITAL_COMMUNITY): Payer: Self-pay
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
## 2022-11-11 ENCOUNTER — Ambulatory Visit: Payer: PPO | Attending: Internal Medicine

## 2022-11-11 ENCOUNTER — Other Ambulatory Visit: Payer: Self-pay

## 2022-11-11 ENCOUNTER — Telehealth: Payer: Self-pay

## 2022-11-11 DIAGNOSIS — Z13220 Encounter for screening for lipoid disorders: Secondary | ICD-10-CM | POA: Diagnosis not present

## 2022-11-11 DIAGNOSIS — I7090 Unspecified atherosclerosis: Secondary | ICD-10-CM | POA: Diagnosis not present

## 2022-11-11 NOTE — Telephone Encounter (Signed)
Pt came in today to have her NMR redrawn... her Lipo a results has made it to the chart... will send to Dr Tenny Craw.   While the pt was here she reports that she stopped her Amlodipine x2 days since she was having knee swelling and was having trouble with stiffness... her retail Pharmacist advised her that she should hold it and since she did she has had full relief.   I checked her BP while she was here and it was 144/70.Marland Kitchen she is being seen in the HTN clinic tomorrow. She will address her BP meds whole she is here tomorrow.

## 2022-11-12 ENCOUNTER — Encounter: Payer: Self-pay | Admitting: Student

## 2022-11-12 ENCOUNTER — Ambulatory Visit (INDEPENDENT_AMBULATORY_CARE_PROVIDER_SITE_OTHER): Payer: PPO | Admitting: Student

## 2022-11-12 VITALS — BP 132/80 | HR 88

## 2022-11-12 DIAGNOSIS — E1165 Type 2 diabetes mellitus with hyperglycemia: Secondary | ICD-10-CM | POA: Diagnosis not present

## 2022-11-12 DIAGNOSIS — E119 Type 2 diabetes mellitus without complications: Secondary | ICD-10-CM | POA: Diagnosis not present

## 2022-11-12 DIAGNOSIS — E1169 Type 2 diabetes mellitus with other specified complication: Secondary | ICD-10-CM | POA: Diagnosis not present

## 2022-11-12 DIAGNOSIS — E1159 Type 2 diabetes mellitus with other circulatory complications: Secondary | ICD-10-CM | POA: Diagnosis not present

## 2022-11-12 DIAGNOSIS — E785 Hyperlipidemia, unspecified: Secondary | ICD-10-CM | POA: Diagnosis not present

## 2022-11-12 DIAGNOSIS — Z794 Long term (current) use of insulin: Secondary | ICD-10-CM | POA: Diagnosis not present

## 2022-11-12 DIAGNOSIS — I152 Hypertension secondary to endocrine disorders: Secondary | ICD-10-CM | POA: Diagnosis not present

## 2022-11-12 LAB — NMR, LIPOPROFILE
Cholesterol, Total: 163 mg/dL (ref 100–199)
HDL Particle Number: 29 umol/L — ABNORMAL LOW (ref >=30.5)
HDL-C: 49 mg/dL (ref >39)
LDL Particle Number: 920 nmol/L (ref <1000)
LDL Size: 20.4 nmol — ABNORMAL LOW (ref >20.5)
LDL-C (NIH Calc): 72 mg/dL (ref 0–99)
LP-IR Score: 63 — ABNORMAL HIGH (ref <=45)
Small LDL Particle Number: 511 nmol/L (ref <=527)
Triglycerides: 258 mg/dL — ABNORMAL HIGH (ref 0–149)

## 2022-11-12 NOTE — Assessment & Plan Note (Signed)
Assessment: BP in office BP 132/80 mmHg heart rate 88 ; goal (<130/80) Unable to tolerate amlodipine due to lower legs swelling stopped it and swelling went away in 2-3 days  Forgot to bring home BP log Takes and tolerates lisinopril well without any side effects Denies SOB, palpitation, chest pain, headaches,or swelling Has cut down on salt intake but still can improve Reiterated the importance of regular exercise and low salt diet   Plan:  Will validate home BP monitor and not making any medication adjustment today  Continue taking lisinopril 40 mg daily and cut down on salt intake exercise regularly - 150 min/week  Patient to keep record of BP readings with heart rate and report to [place] at the next visit Patient to see PharmD in 4 weeks for follow up  Follow up lab(s):none

## 2022-11-12 NOTE — Patient Instructions (Signed)
NO changes to your BP medication made by your pharmacist Carmela Hurt, PharmD at today's visit:   Bring all of your meds, your BP cuff and your record of home blood pressures to your next appointment.    HOW TO TAKE YOUR BLOOD PRESSURE AT HOME  Rest 5 minutes before taking your blood pressure.  Don't smoke or drink caffeinated beverages for at least 30 minutes before. Take your blood pressure before (not after) you eat. Sit comfortably with your back supported and both feet on the floor (don't cross your legs). Elevate your arm to heart level on a table or a desk. Use the proper sized cuff. It should fit smoothly and snugly around your bare upper arm. There should be enough room to slip a fingertip under the cuff. The bottom edge of the cuff should be 1 inch above the crease of the elbow. Ideally, take 3 measurements at one sitting and record the average.  Important lifestyle changes to control high blood pressure  Intervention  Effect on the BP  Lose extra pounds and watch your waistline Weight loss is one of the most effective lifestyle changes for controlling blood pressure. If you're overweight or obese, losing even a small amount of weight can help reduce blood pressure. Blood pressure might go down by about 1 millimeter of mercury (mm Hg) with each kilogram (about 2.2 pounds) of weight lost.  Exercise regularly As a general goal, aim for at least 30 minutes of moderate physical activity every day. Regular physical activity can lower high blood pressure by about 5 to 8 mm Hg.  Eat a healthy diet Eating a diet rich in whole grains, fruits, vegetables, and low-fat dairy products and low in saturated fat and cholesterol. A healthy diet can lower high blood pressure by up to 11 mm Hg.  Reduce salt (sodium) in your diet Even a small reduction of sodium in the diet can improve heart health and reduce high blood pressure by about 5 to 6 mm Hg.  Limit alcohol One drink equals 12 ounces of  beer, 5 ounces of wine, or 1.5 ounces of 80-proof liquor.  Limiting alcohol to less than one drink a day for women or two drinks a day for men can help lower blood pressure by about 4 mm Hg.   If you have any questions or concerns please use My Chart to send questions or call the office at [phone]

## 2022-11-20 NOTE — Telephone Encounter (Signed)
She could not tolerate any of GLP1 agents due to severe upset stomach.

## 2022-11-29 ENCOUNTER — Other Ambulatory Visit: Payer: Self-pay | Admitting: Family Medicine

## 2022-12-07 ENCOUNTER — Other Ambulatory Visit: Payer: Self-pay | Admitting: Family Medicine

## 2022-12-11 ENCOUNTER — Encounter: Payer: Self-pay | Admitting: Family Medicine

## 2022-12-11 DIAGNOSIS — E1165 Type 2 diabetes mellitus with hyperglycemia: Secondary | ICD-10-CM

## 2022-12-11 MED ORDER — ONETOUCH VERIO VI STRP: ORAL_STRIP | 3 refills | Status: AC

## 2022-12-17 ENCOUNTER — Ambulatory Visit: Payer: PPO | Attending: Cardiovascular Disease | Admitting: Student

## 2022-12-17 ENCOUNTER — Encounter: Payer: Self-pay | Admitting: Student

## 2022-12-17 VITALS — BP 124/70 | HR 83

## 2022-12-17 DIAGNOSIS — I152 Hypertension secondary to endocrine disorders: Secondary | ICD-10-CM | POA: Diagnosis not present

## 2022-12-17 DIAGNOSIS — E1159 Type 2 diabetes mellitus with other circulatory complications: Secondary | ICD-10-CM

## 2022-12-17 NOTE — Progress Notes (Signed)
Patient ID: Jean Robinson                 DOB: 19-Dec-1955                      MRN: [id number]      HPI: Jean Robinson is a 67 y.o. female referred by Dr. Tenny Craw to HTN clinic. PMH is significant for hypertension, diabetes, arterial calcification, hyperparathyroidism, hx of gout, HLD, pyelonephritis.  At last visit with Dr.Ross BP was elevated (163/94 heart rate 81) amlodipine 5 mg was added to other BP medications patient was referred to hypertension clinic. Patient is diabetic and manages sugar with insulin  (basal bolus regimen)and metformin recent A1c 9.1 still above goal can consider adding GLP1 agent to get CV benefit and it will lower BG. LDLc goal is <55 mg. BMI 29.54   At last visit with PharmD lifestyle interventions were discussed. In office BP was near to goal. Today patient presented for follow up. Brought in home BP machine for validation found out to be inaccurate. Patient report she has been watching sodium intake more carefully then before. Denies dizziness, swelling, palpitation SOB or chest pain.    Current HTN meds:  lisinopril 40 mg daily Previously tried: amlodipine 5 mg swelling of lower extremities  BP goal: <130/80  Family History:      Relation Problem Comments  Mother COPD   Diabetes   Macular degeneration     Father (Deceased) Depression   Diabetes     Brother Diabetes     Other - grandparents Cancer liver  Diabetes        Social History:  Alcohol: rarely 1 drink every 2 months  Smoking: never   Diet: low salt low fat diet   Exercise: bikes - 4 days week 5 miles stays active gets 5K steps during the day   Home BP monitor validation   147/86  heart rate 83 1st  on home monitor  139/81 heart rate 84  2nd on home monitor  126/73 heart rate 82 3rd  on home monitor  138/76 heart rate 83  4th  on home monitor  130/70 heart rate 82 1st on office cuff  124/70 heart rate 83 2nd on office cuff   Wt Readings from Last 3 Encounters:  10/07/22 185 lb 12.8 oz  (84.3 kg)  09/24/22 182 lb (82.6 kg)  07/17/22 185 lb 6.4 oz (84.1 kg)   BP Readings from Last 3 Encounters:  12/17/22 124/70  11/12/22 132/80  11/04/22 (!) 125/58   Pulse Readings from Last 3 Encounters:  12/17/22 83  11/12/22 88  11/04/22 100    Renal function: CrCl cannot be calculated (Patient's most recent lab result is older than the maximum 21 days allowed.).  Past Medical History:  Diagnosis Date   Achilles tendonitis    Allergy    Anxiety    Closed fracture of lateral portion of right tibial plateau 07/30/2017   COVID 2021   Depression    Diabetes mellitus    H/O renal calculi 10/08/2009   Hyperlipidemia    Hypertension    Serum calcium elevated    STD (sexually transmitted disease)    HSV I & II, IgG reflex positive   Tibial plateau fracture, right     Current Outpatient Medications on File Prior to Visit  Medication Sig Dispense Refill   alendronate (FOSAMAX) 70 MG tablet TAKE 1 TABLET (70 MG TOTAL) BY MOUTH ONCE A WEEK  12 tablet 3   B-D ULTRAFINE III SHORT PEN 31G X 8 MM MISC USE 1 PEN AS DIRECTED 100 each 3   Blood Glucose Monitoring Suppl (ONETOUCH VERIO REFLECT) w/Device KIT Check blood sugar twice a day and as directed. Dx E11.65 1 kit 0   cetirizine (ZYRTEC) 10 MG tablet Take 1 tablet (10 mg total) by mouth daily. 30 tablet 11   colchicine 0.6 MG tablet Take 1 tablet (0.6 mg total) by mouth 2 (two) times daily as needed. 180 tablet 0   Continuous Blood Gluc Receiver (FREESTYLE LIBRE 2 READER) DEVI Use to check blood sugar continuous due to low blood sugars at night.  Updated Rx 1 each 0   Continuous Glucose Sensor (FREESTYLE LIBRE 3 PLUS SENSOR) MISC 1 each by Does not apply route every 14 (fourteen) days. 12 each 3   fluticasone (FLONASE) 50 MCG/ACT nasal spray SPRAY 2 SPRAYS INTO EACH NOSTRIL EVERY DAY 48 mL 3   glucose blood (ONETOUCH VERIO) test strip Use to check blood sugar twice daily 100 strip 3   insulin aspart (NOVOLOG FLEXPEN) 100 UNIT/ML  FlexPen USING SLIDING SCALE TO DETERMINE MEALTIME DOSE SUBCUTANEOUS INJECTION, MAX DOSE PER AY 20 UNITS 15 mL 3   insulin glargine (LANTUS SOLOSTAR) 100 UNIT/ML Solostar Pen Inject 34 Units into the skin at bedtime. 15 mL 11   Lancet Devices (ONE TOUCH DELICA LANCING DEV) MISC Check blood sugar twice a day.  Dx E11.65 1 each 0   latanoprost (XALATAN) 0.005 % ophthalmic solution Place 1 drop into both eyes at bedtime.      lisinopril (ZESTRIL) 40 MG tablet TAKE 1 TABLET BY MOUTH EVERY DAY 90 tablet 1   loperamide (IMODIUM) 2 MG capsule Take 2 mg by mouth as needed.     metFORMIN (GLUCOPHAGE-XR) 500 MG 24 hr tablet TAKE 2 TABLETS BY MOUTH TWICE A DAY 360 tablet 1   nitroGLYCERIN (NITRODUR - DOSED IN MG/24 HR) 0.2 mg/hr patch Place 1 patch (0.2 mg total) onto the skin daily. Apply to Achilles tendon 30 patch 0   pravastatin (PRAVACHOL) 40 MG tablet TAKE 1 TABLET BY MOUTH EVERY DAY 90 tablet 3   sertraline (ZOLOFT) 25 MG tablet TAKE 1 TABLET (25 MG TOTAL) BY MOUTH DAILY. 90 tablet 1   SYSTANE ULTRA 0.4-0.3 % SOLN SMARTSIG:1 Drop(s) In Eye(s) As Needed     valACYclovir (VALTREX) 500 MG tablet TAKE 1 TABLET BY MOUTH 2 TIMES DAILY, TAKE FOR 3 DAYS FOR ACUTE FLARE 180 tablet 0   No current facility-administered medications on file prior to visit.    No Known Allergies  Blood pressure 124/70, pulse 83, last menstrual period 02/02/2006.   Assessment/Plan:  1. Hypertension -  Hypertension associated with diabetes (HCC) Assessment: BP in office BP 124/70 mmHg heart rate 83 ; goal (<130/80) Home BP monitor validated today- it was inaccurate, patient will be buying new one  Takes and tolerates lisinopril well without any side effects Denies SOB, palpitation, chest pain, headaches,or swelling Have been vigilant on salt intake    Plan:  Continue taking lisinopril 40 mg daily and buy new BP monitor  Patient to bring home BP readings and new monitor at the next OV in 6 weeks  Follow up  lab(s):none     Thank you  Carmela Hurt, Pharm.D [facility] A Division of [facility] [address] [place], [place] [address]  Phone: [phone]; Fax: [phone]

## 2022-12-17 NOTE — Assessment & Plan Note (Signed)
Assessment: BP in office BP 124/70 mmHg heart rate 83 ; goal (<130/80) Home BP monitor validated today- it was inaccurate, patient will be buying new one  Takes and tolerates lisinopril well without any side effects Denies SOB, palpitation, chest pain, headaches,or swelling Have been vigilant on salt intake    Plan:  Continue taking lisinopril 40 mg daily and buy new BP monitor  Patient to bring home BP readings and new monitor at the next OV in 6 weeks  Follow up lab(s):none

## 2022-12-28 DIAGNOSIS — E1165 Type 2 diabetes mellitus with hyperglycemia: Secondary | ICD-10-CM | POA: Diagnosis not present

## 2022-12-28 DIAGNOSIS — E1159 Type 2 diabetes mellitus with other circulatory complications: Secondary | ICD-10-CM | POA: Diagnosis not present

## 2022-12-28 DIAGNOSIS — E119 Type 2 diabetes mellitus without complications: Secondary | ICD-10-CM | POA: Diagnosis not present

## 2022-12-28 DIAGNOSIS — Z794 Long term (current) use of insulin: Secondary | ICD-10-CM | POA: Diagnosis not present

## 2022-12-28 DIAGNOSIS — E1169 Type 2 diabetes mellitus with other specified complication: Secondary | ICD-10-CM | POA: Diagnosis not present

## 2022-12-28 DIAGNOSIS — I152 Hypertension secondary to endocrine disorders: Secondary | ICD-10-CM | POA: Diagnosis not present

## 2022-12-28 DIAGNOSIS — E785 Hyperlipidemia, unspecified: Secondary | ICD-10-CM | POA: Diagnosis not present

## 2023-02-11 ENCOUNTER — Ambulatory Visit: Payer: PPO

## 2023-02-17 ENCOUNTER — Other Ambulatory Visit: Payer: Self-pay | Admitting: Family Medicine

## 2023-02-17 ENCOUNTER — Encounter: Payer: Self-pay | Admitting: Family Medicine

## 2023-02-17 MED ORDER — MONTELUKAST SODIUM 10 MG PO TABS: 10.0000 mg | ORAL_TABLET | Freq: Every day | ORAL | 3 refills | Status: DC

## 2023-02-21 ENCOUNTER — Other Ambulatory Visit: Payer: Self-pay | Admitting: Family Medicine

## 2023-02-22 NOTE — Telephone Encounter (Signed)
Spoke with patient and she will call back to schedule

## 2023-02-22 NOTE — Telephone Encounter (Signed)
 Please schedule Medicare Wellness with Steward Drone and CPE with fasting labs prior with Dr. Ermalene Searing.

## 2023-02-23 NOTE — Telephone Encounter (Signed)
Copied from CRM [id number]. Topic: General - Call Back - No Documentation >> Feb 23, 2023 11:12 AM Almira Coaster wrote: Reason for CRM: Patient received a call yesterday but was unable to answer regarding scheduling some appointments for Dr.Bedsole. No documentation on file. Patient states it's easier to reach her through MyChart. Please send a message for what she needs to schedule.

## 2023-02-23 NOTE — Telephone Encounter (Signed)
Patient has been scheduled

## 2023-03-03 ENCOUNTER — Encounter: Payer: Self-pay | Admitting: Family Medicine

## 2023-03-04 DIAGNOSIS — E1165 Type 2 diabetes mellitus with hyperglycemia: Secondary | ICD-10-CM | POA: Diagnosis not present

## 2023-03-04 DIAGNOSIS — E1169 Type 2 diabetes mellitus with other specified complication: Secondary | ICD-10-CM | POA: Diagnosis not present

## 2023-03-04 DIAGNOSIS — Z794 Long term (current) use of insulin: Secondary | ICD-10-CM | POA: Diagnosis not present

## 2023-03-04 DIAGNOSIS — E1159 Type 2 diabetes mellitus with other circulatory complications: Secondary | ICD-10-CM | POA: Diagnosis not present

## 2023-03-04 DIAGNOSIS — E785 Hyperlipidemia, unspecified: Secondary | ICD-10-CM | POA: Diagnosis not present

## 2023-03-04 DIAGNOSIS — I152 Hypertension secondary to endocrine disorders: Secondary | ICD-10-CM | POA: Diagnosis not present

## 2023-03-04 DIAGNOSIS — E119 Type 2 diabetes mellitus without complications: Secondary | ICD-10-CM | POA: Diagnosis not present

## 2023-03-04 LAB — HEMOGLOBIN A1C: Hemoglobin A1C: 10.1

## 2023-03-06 ENCOUNTER — Other Ambulatory Visit: Payer: Self-pay | Admitting: Family Medicine

## 2023-03-06 DIAGNOSIS — I10 Essential (primary) hypertension: Secondary | ICD-10-CM

## 2023-03-11 DIAGNOSIS — H401131 Primary open-angle glaucoma, bilateral, mild stage: Secondary | ICD-10-CM | POA: Diagnosis not present

## 2023-03-14 ENCOUNTER — Other Ambulatory Visit: Payer: Self-pay | Admitting: Family Medicine

## 2023-03-23 ENCOUNTER — Telehealth: Payer: Self-pay | Admitting: *Deleted

## 2023-03-23 ENCOUNTER — Encounter: Payer: Self-pay | Admitting: Family Medicine

## 2023-03-23 DIAGNOSIS — E1165 Type 2 diabetes mellitus with hyperglycemia: Secondary | ICD-10-CM

## 2023-03-23 DIAGNOSIS — Z1211 Encounter for screening for malignant neoplasm of colon: Secondary | ICD-10-CM

## 2023-03-23 DIAGNOSIS — E7800 Pure hypercholesterolemia, unspecified: Secondary | ICD-10-CM

## 2023-03-23 NOTE — Telephone Encounter (Signed)
Yes, it looks like there was an error with the lipid panel.  I do not see that it was done per Dr. Tenny Craw.  Apolipoprotein was checked. Order lipid unless patient plans to return to have done with Dr. Tenny Craw.  Panel

## 2023-03-23 NOTE — Telephone Encounter (Signed)
Please review labs and let me know if patient needs any CPE labs.  Had labs done with cardiology and endo.

## 2023-03-23 NOTE — Telephone Encounter (Signed)
-----   Message from Lovena Neighbours sent at 03/23/2023  9:09 AM EST ----- Regarding: Labs for Wednesday 2.26.25 Please put physical lab orders in future. Thank you, Denny Peon

## 2023-03-24 NOTE — Telephone Encounter (Signed)
 Unable to reach patient. Left voicemail to return call to our office.

## 2023-03-25 NOTE — Telephone Encounter (Signed)
 Noted

## 2023-03-25 NOTE — Addendum Note (Signed)
Addended by: Damita Lack on: 03/25/2023 12:34 PM   Modules accepted: Orders

## 2023-03-25 NOTE — Telephone Encounter (Signed)
Spoke with Jean Robinson.  She has not plans to return to Dr. Tenny Craw for Lipid Panel.  I went ahead and placed future orders for Lipid, Microalbumin and Cologuard.  FYI to Dr. Ermalene Searing.

## 2023-03-26 ENCOUNTER — Other Ambulatory Visit: Payer: Self-pay

## 2023-03-26 ENCOUNTER — Ambulatory Visit
Admission: RE | Admit: 2023-03-26 | Discharge: 2023-03-26 | Disposition: A | Discharge status: Home / Self Care | Payer: PPO | Source: Ambulatory Visit | Attending: Internal Medicine | Admitting: Internal Medicine

## 2023-03-26 VITALS — BP 140/82 | HR 105 | Temp 98.2°F | Resp 18

## 2023-03-26 DIAGNOSIS — J208 Acute bronchitis due to other specified organisms: Secondary | ICD-10-CM

## 2023-03-26 DIAGNOSIS — R051 Acute cough: Secondary | ICD-10-CM | POA: Diagnosis not present

## 2023-03-26 MED ORDER — HYDROCODONE BIT-HOMATROP MBR 5-1.5 MG/5ML PO SOLN: 5.0000 mL | Freq: Three times a day (TID) | ORAL | 0 refills | Status: DC | PRN

## 2023-03-26 MED ORDER — DOXYCYCLINE HYCLATE 100 MG PO CAPS: 100.0000 mg | ORAL_CAPSULE | Freq: Two times a day (BID) | ORAL | 0 refills | Status: AC

## 2023-03-26 MED ORDER — BENZONATATE 100 MG PO CAPS: 100.0000 mg | ORAL_CAPSULE | Freq: Three times a day (TID) | ORAL | 0 refills | Status: DC

## 2023-03-26 NOTE — ED Triage Notes (Signed)
Been going on since Sunday.  Coughing is really bad at night so not getting much sleep.  Switces between runny nose and congestion.  Can't really take OTC meds to help due to HBP and diabeties.    No fever or body aches. - Entered by patient  States "I've been coughing so much my stomach muscles hurt and I can't get any sleep"

## 2023-03-26 NOTE — ED Provider Notes (Signed)
[name]    CSN: [id number] Arrival date & time: 03/26/23  1751      History   Chief Complaint Chief Complaint  Patient presents with   URI    HPI Jean Robinson is a 68 y.o. female.   68 y.o. female who presents to urgent care with complaints of cough, congestion and runny nose.  This started on Sunday.  She has not had any fevers, chills or bodyaches.  She has had similar symptoms around a year ago.  She did try to contact her primary care physician but was unable to get an appointment.  She reports that the cough is so severe that she is unable to sleep at night and that at times she coughs so hard that it makes her want to throw up.  She denies sore throat, ear pain, shortness of breath, chest pain.  She does have diabetes and hypertension making it difficult for her to use a lot of over-the-counter medications.  She notes that her blood sugars have been more elevated since she started having the cough.   URI Presenting symptoms: congestion, cough and rhinorrhea   Presenting symptoms: no ear pain, no fever and no sore throat   Associated symptoms: no arthralgias     Past Medical History:  Diagnosis Date   Achilles tendonitis    Allergy    Anxiety    Closed fracture of lateral portion of right tibial plateau 07/30/2017   COVID 2021   Depression    Diabetes mellitus    H/O renal calculi 10/08/2009   Hyperlipidemia    Hypertension    Serum calcium elevated    STD (sexually transmitted disease)    HSV I & II, IgG reflex positive   Tibial plateau fracture, right     Patient Active Problem List   Diagnosis Date Noted   Gastroesophageal reflux disease 09/24/2022   Other fatigue 09/24/2022   Inflamed skin tag 09/24/2022   Arterial calcification 07/17/2022   Gout 04/20/2022   Acute diarrhea 03/06/2022   Multiple joint pain 07/29/2021   Hyperparathyroidism (HCC) 09/19/2020   Osteopenia 09/19/2020   Hypertension associated with diabetes (HCC) 03/17/2019    Motion sickness 03/17/2019   Disorder of right Achilles tendon 03/17/2019   Pyelonephritis 12/28/2017   Acute right-sided low back pain without sciatica 12/08/2017   High serum calcium 10/01/2017   Closed fracture of lateral portion of right tibial plateau 07/30/2017   Genital herpes 10/10/2015   Uncontrolled type 2 diabetes mellitus with hyperglycemia (HCC)    DEPRESSION, MILD 02/27/2008   High cholesterol 11/18/2006   ALLERGIC RHINITIS 11/18/2006    Past Surgical History:  Procedure Laterality Date   APPENDECTOMY     CHOLECYSTECTOMY     ORIF TIBIA PLATEAU Right 07/30/2017   Procedure: OPEN REDUCTION INTERNAL FIXATION (ORIF) TIBIAL PLATEAU;  Surgeon: Teryl Lucy, MD;  Location: [facility];  Service: Orthopedics;  Laterality: Right;   TONSILLECTOMY      OB History     Gravida  4   Para  4   Term  4   Preterm  0   AB  0   Living  4      SAB  0   IAB  0   Ectopic  0   Multiple  0   Live Births  4            Home Medications    Prior to Admission medications   Medication Sig Start Date  End Date Taking? Authorizing Provider  alendronate (FOSAMAX) 70 MG tablet TAKE 1 TABLET (70 MG TOTAL) BY MOUTH ONCE A WEEK 07/15/22  Yes Bedsole, Amy E, MD  cetirizine (ZYRTEC) 10 MG tablet Take 1 tablet (10 mg total) by mouth daily. 10/30/18  Yes Hawks, Christy A, FNP  colchicine 0.6 MG tablet Take 1 tablet (0.6 mg total) by mouth 2 (two) times daily as needed. 05/12/22  Yes Karie Schwalbe, MD  Continuous Glucose Sensor (FREESTYLE LIBRE 3 PLUS SENSOR) MISC 1 each by Does not apply route every 14 (fourteen) days. 10/21/22  Yes Bedsole, Amy E, MD  fluticasone (FLONASE) 50 MCG/ACT nasal spray SPRAY 2 SPRAYS INTO EACH NOSTRIL EVERY DAY 03/30/22  Yes Bedsole, Amy E, MD  insulin aspart (NOVOLOG FLEXPEN) 100 UNIT/ML FlexPen USING SLIDING SCALE TO DETERMINE MEALTIME DOSE SUBCUTANEOUS INJECTION, MAX DOSE PER AY 20 UNITS 12/11/21  Yes Bedsole, Amy E, MD  insulin  glargine (LANTUS SOLOSTAR) 100 UNIT/ML Solostar Pen Inject 34 Units into the skin at bedtime. 12/07/22  Yes Bedsole, Amy E, MD  latanoprost (XALATAN) 0.005 % ophthalmic solution Place 1 drop into both eyes at bedtime.  07/26/15  Yes [provider]  lisinopril (ZESTRIL) 40 MG tablet TAKE 1 TABLET BY MOUTH EVERY DAY 03/08/23  Yes Bedsole, Amy E, MD  loperamide (IMODIUM) 2 MG capsule Take 2 mg by mouth as needed. 02/27/22  Yes [provider]  metFORMIN (GLUCOPHAGE-XR) 500 MG 24 hr tablet TAKE 2 TABLETS BY MOUTH TWICE A DAY 11/30/22  Yes Bedsole, Amy E, MD  montelukast (SINGULAIR) 10 MG tablet Take 1 tablet (10 mg total) by mouth at bedtime. 02/17/23  Yes Bedsole, Amy E, MD  pravastatin (PRAVACHOL) 40 MG tablet TAKE 1 TABLET BY MOUTH EVERY DAY 02/22/23  Yes Bedsole, Amy E, MD  sertraline (ZOLOFT) 25 MG tablet TAKE 1 TABLET (25 MG TOTAL) BY MOUTH DAILY. 03/15/23  Yes Bedsole, Amy E, MD  SYSTANE ULTRA 0.4-0.3 % SOLN SMARTSIG:1 Drop(s) In Eye(s) As Needed 10/26/18  Yes [provider]  valACYclovir (VALTREX) 500 MG tablet TAKE 1 TABLET BY MOUTH 2 TIMES DAILY, TAKE FOR 3 DAYS FOR ACUTE FLARE 04/07/22  Yes Bedsole, Amy E, MD  B-D ULTRAFINE III SHORT PEN 31G X 8 MM MISC USE 1 PEN AS DIRECTED 07/07/22   Bedsole, Amy E, MD  Blood Glucose Monitoring Suppl (ONETOUCH VERIO REFLECT) w/Device KIT Check blood sugar twice a day and as directed. Dx E11.65 01/09/20   Excell Seltzer, MD  Continuous Blood Gluc Receiver (FREESTYLE LIBRE 2 READER) DEVI Use to check blood sugar continuous due to low blood sugars at night.  Updated Rx 11/25/20   Excell Seltzer, MD  glucose blood (ONETOUCH VERIO) test strip Use to check blood sugar twice daily 12/11/22   Excell Seltzer, MD  Lancet Devices (ONE TOUCH DELICA LANCING DEV) MISC Check blood sugar twice a day.  Dx E11.65 08/31/22   Excell Seltzer, MD  nitroGLYCERIN (NITRODUR - DOSED IN MG/24 HR) 0.2 mg/hr patch Place 1 patch (0.2 mg total) onto the skin daily. Apply to  Achilles tendon 12/01/21   Vivi Barrack, DPM    Family History Family History  Problem Relation Age of Onset   Depression Father    Diabetes Father    COPD Mother    Diabetes Mother    Macular degeneration Mother    Diabetes Brother    Cancer Other        liver   Diabetes Other  Social History Social History   Tobacco Use   Smoking status: Never   Smokeless tobacco: Never  Vaping Use   Vaping status: Never Used  Substance Use Topics   Alcohol use: Yes    Alcohol/week: 0.0 - 2.0 standard drinks of alcohol    Comment: 2 drinks/month   Drug use: No     Allergies   Amlodipine   Review of Systems Review of Systems  Constitutional:  Negative for chills and fever.  HENT:  Positive for congestion and rhinorrhea. Negative for ear pain and sore throat.   Eyes:  Negative for pain and visual disturbance.  Respiratory:  Positive for cough. Negative for shortness of breath.   Cardiovascular:  Negative for chest pain and palpitations.  Gastrointestinal:  Negative for abdominal pain and vomiting.  Genitourinary:  Negative for dysuria and hematuria.  Musculoskeletal:  Negative for arthralgias and back pain.  Skin:  Negative for color change and rash.  Neurological:  Negative for seizures and syncope.  All other systems reviewed and are negative.    Physical Exam Triage Vital Signs ED Triage Vitals  Encounter Vitals Group     BP 03/26/23 0932 (!) 140/82     Systolic BP Percentile --      Diastolic BP Percentile --      Pulse Rate 03/26/23 0932 (!) 105     Resp 03/26/23 0932 18     Temp 03/26/23 0932 98.2 F (36.8 C)     Temp Source 03/26/23 0932 Oral     SpO2 03/26/23 0932 97 %     Weight --      Height --      Head Circumference --      Peak Flow --      Pain Score 03/26/23 0934 2     Pain Loc --      Pain Education --      Exclude from Growth Chart --    No data found.  Updated Vital Signs BP (!) 140/82 (BP Location: Right Arm)   Pulse (!) 105    Temp 98.2 F (36.8 C) (Oral)   Resp 18   LMP 02/02/2006   SpO2 97%   Visual Acuity Right Eye Distance:   Left Eye Distance:   Bilateral Distance:    Right Eye Near:   Left Eye Near:    Bilateral Near:     Physical Exam Vitals and nursing note reviewed.  Constitutional:      General: She is not in acute distress.    Appearance: She is well-developed.  HENT:     Head: Normocephalic and atraumatic.     Right Ear: Tympanic membrane normal.     Left Ear: Tympanic membrane normal.     Nose: Nose normal.     Mouth/Throat:     Mouth: Mucous membranes are moist.  Eyes:     Conjunctiva/sclera: Conjunctivae normal.  Cardiovascular:     Rate and Rhythm: Normal rate and regular rhythm.     Heart sounds: No murmur heard. Pulmonary:     Effort: Pulmonary effort is normal. No respiratory distress.     Breath sounds: Normal breath sounds.  Abdominal:     Palpations: Abdomen is soft.     Tenderness: There is no abdominal tenderness.  Musculoskeletal:        General: No swelling.     Cervical back: Neck supple.  Skin:    General: Skin is warm and dry.     Capillary Refill: Capillary  refill takes less than 2 seconds.  Neurological:     Mental Status: She is alert.  Psychiatric:        Mood and Affect: Mood normal.      UC Treatments / Results  Labs (all labs ordered are listed, but only abnormal results are displayed) Labs Reviewed - No data to display  EKG   Radiology No results found.  Procedures Procedures (including critical care time)  Medications Ordered in UC Medications - No data to display  Initial Impression / Assessment and Plan / UC Course  I have reviewed the triage vital signs and the nursing notes.  Pertinent labs & imaging results that were available during my care of the patient were reviewed by me and considered in my medical decision making (see chart for details).     Acute cough  Acute bronchitis due to other specified organisms      Likely with acute bronchitis but due to DM and high blood pressure it is more difficult to treat. Given no fever/body aches, low concern for flu or covid so testing deferred. We will treat with the following:  Doxycycline 100 mg twice daily for 7 days. Take this with food. This is an antibiotic that can help with inflammation as well.   Benzonatate (tessalon) 100 mg every 8 hours as needed for cough.  Use this medication during the day. Hycodan 5 mLs every 8 hours as needed for cough. Use this medication at night as this medication can cause drowsiness.  Continue to use humidifier  Rest and stay hydrated. Drink plenty of fluids even if you don't feel like taking in solid food. Return to urgent care or PCP if symptoms worsen or fail to resolve.    Final Clinical Impressions(s) / UC Diagnoses   Final diagnoses:  None   Discharge Instructions   None    ED Prescriptions   None    PDMP not reviewed this encounter.   Landis Martins, [place] 03/26/23 1041

## 2023-03-26 NOTE — Discharge Instructions (Addendum)
Likely with acute bronchitis but due to DM and high blood pressure it is more difficult to treat. Given no fever/body aches, low concern for flu or covid so testing deferred. We will treat with the following:  Doxycycline 100 mg twice daily for 7 days. Take this with food. This is an antibiotic that can help with inflammation as well.   Benzonatate (tessalon) 100 mg every 8 hours as needed for cough.  Use this medication during the day. Hycodan 5 mLs every 8 hours as needed for cough. Use this medication at night as this medication can cause drowsiness.  Continue to use humidifier  Rest and stay hydrated. Drink plenty of fluids even if you don't feel like taking in solid food. Return to urgent care or PCP if symptoms worsen or fail to resolve.

## 2023-03-30 ENCOUNTER — Ambulatory Visit: Payer: Self-pay | Admitting: Family Medicine

## 2023-03-30 NOTE — Telephone Encounter (Signed)
 Chief Complaint: Cold symptoms Symptoms: Productive cough,  fatigue Frequency: Ongoing Pertinent Negatives: Patient denies fever, difficulty breathing, aches, runny nose, earache, rattling in chest Disposition: [] ED /[] Urgent Care (no appt availability in office) / [x] Appointment(In office/virtual)/ []  [facility]/ [] Home Care/ [] Refused Recommended Disposition /[]  Mobile Bus/ []  Follow-up with PCP Additional Notes: Pt states she has had a cold for about a week and hasn't gotten better. Pt went to [facility] urgent care last week and was diagnosed with bronchitis. Pt was prescribed tessalon pearls, codeine cough syrup and doxycycline which she finishes today. Pt states she has gotten a little better but still not feeling well. Pt is wanting to be tested for RSV since she is taking care of her grand-daughter next week. Pt scheduled for an appointment with PCP this Thursday, 2/27. This RN educated pt on home care, new-worsening symptoms, when to call back/seek emergent care. Pt verbalized understanding and agrees to plan.      Copied from CRM [id number]. Topic: Clinical - Red Word Triage >> Mar 30, 2023  4:10 PM Jean Robinson wrote: Red Word that prompted transfer to Nurse Triage: Cold for about a week not getting better ; increased mucous tends to be clear but sometimes white Reason for Disposition  [1] Sinus congestion (pressure, fullness) AND [2] present > 10 days  Answer Assessment - Initial Assessment Questions 1. ONSET: "When did the nasal discharge start?"      A week go 2. AMOUNT: "How much discharge is there?"      A lot 3. COUGH: "Do you have a cough?" If Yes, ask: "Describe the color of your sputum" (clear, white, yellow, green)     Yes, white  4. RESPIRATORY DISTRESS: "Describe your breathing."      Denies 5. FEVER: "Do you have a fever?" If Yes, ask: "What is your temperature, how was it measured, and when did it start?"     Denies  Protocols used: Common  Cold-A-AH

## 2023-03-31 ENCOUNTER — Other Ambulatory Visit: Payer: PPO

## 2023-03-31 NOTE — Telephone Encounter (Signed)
 Jean Robinson notified by telephone that Dr. Ermalene Searing feels she is no longer contagious and safe to go visit grand baby. Advised she doesn't need to come in tomorrow to be tested for RSV unless she really wants to.  Patient states understanding and is good with cancelling appointment.  Office visit cancelled on 04/01/23.

## 2023-03-31 NOTE — Telephone Encounter (Signed)
 Left message for Jean Robinson to return my call to office.

## 2023-04-01 ENCOUNTER — Other Ambulatory Visit: Payer: PPO

## 2023-04-01 ENCOUNTER — Ambulatory Visit: Payer: PPO | Admitting: Family Medicine

## 2023-04-01 ENCOUNTER — Encounter: Payer: Self-pay | Admitting: Family Medicine

## 2023-04-01 DIAGNOSIS — E1165 Type 2 diabetes mellitus with hyperglycemia: Secondary | ICD-10-CM | POA: Diagnosis not present

## 2023-04-01 DIAGNOSIS — E7800 Pure hypercholesterolemia, unspecified: Secondary | ICD-10-CM | POA: Diagnosis not present

## 2023-04-01 LAB — LIPID PANEL
Cholesterol: 133 mg/dL (ref 0–200)
HDL: 49.4 mg/dL (ref >39.00)
LDL Cholesterol: 55 mg/dL (ref 0–99)
NonHDL: 83.42
Total CHOL/HDL Ratio: 3
Triglycerides: 140 mg/dL (ref 0.0–149.0)
VLDL: 28 mg/dL (ref 0.0–40.0)

## 2023-04-01 LAB — MICROALBUMIN / CREATININE URINE RATIO
Creatinine,U: 100.6 mg/dL
Microalb Creat Ratio: 20.9 mg/g (ref 0.0–30.0)
Microalb, Ur: 2.1 mg/dL — ABNORMAL HIGH (ref 0.0–1.9)

## 2023-04-01 NOTE — Progress Notes (Signed)
 No critical labs need to be addressed urgently. We will discuss labs in detail at upcoming office visit.

## 2023-04-02 ENCOUNTER — Ambulatory Visit: Payer: PPO

## 2023-04-02 VITALS — Ht 66.5 in | Wt 185.0 lb

## 2023-04-02 DIAGNOSIS — Z1231 Encounter for screening mammogram for malignant neoplasm of breast: Secondary | ICD-10-CM | POA: Diagnosis not present

## 2023-04-02 DIAGNOSIS — Z0000 Encounter for general adult medical examination without abnormal findings: Secondary | ICD-10-CM | POA: Diagnosis not present

## 2023-04-02 NOTE — Patient Instructions (Signed)
 Ms. Copus , Thank you for taking time to come for your Medicare Wellness Visit. I appreciate your ongoing commitment to your health goals. Please review the following plan we discussed and let me know if I can assist you in the future.   Referrals/Orders/Follow-Ups/Clinician Recommendations:   You have an order for:  []   2D Mammogram  [x]   3D Mammogram  []   Bone Density     Please call for appointment:  [facility] [address] [place], [place] [address] [phone]  [facility] [address] [place], [place] [address] [phone]    Make sure to wear two-piece clothing.  No lotions, powders, or deodorants the day of the appointment. Make sure to bring picture ID and insurance card.  Bring list of medications you are currently taking including any supplements.   Schedule your [place] screening mammogram through MyChart!   Log into your MyChart account.  Go to 'Visit' (or 'Appointments' if on mobile App) --> Schedule an Appointment  Under 'Select a Reason for Visit' choose the Mammogram Screening option.  Complete the pre-visit questions and select the time and place that best fits your schedule.    This is a list of the screening recommended for you and due dates:  Health Maintenance  Topic Date Due   Complete foot exam   09/27/2022   COVID-19 Vaccine (5 - 2024-25 season) 10/04/2022   Cologuard (Stool DNA test)  12/19/2022   Hemoglobin A1C  04/06/2023   Eye exam for diabetics  06/17/2023   Yearly kidney function blood test for diabetes  10/07/2023   Yearly kidney health urinalysis for diabetes  03/31/2024   Medicare Annual Wellness Visit  04/01/2024   Mammogram  07/14/2024   DTaP/Tdap/Td vaccine (3 - Td or Tdap) 09/12/2028   Pneumonia Vaccine  Completed   Flu Shot  Completed   DEXA scan (bone density measurement)  Completed   Hepatitis C Screening  Completed   Zoster (Shingles) Vaccine  Completed   HPV Vaccine   Aged Out   Colon Cancer Screening  Discontinued    Advanced directives: (Declined) Advance directive discussed with you today. Even though you declined this today, please call our office should you change your mind, and we can give you the proper paperwork for you to fill out. Pt says in the process of completing these.  Next Medicare Annual Wellness Visit scheduled for next year: Yes 04/04/24 @ 1pm televisit

## 2023-04-02 NOTE — Progress Notes (Signed)
 Subjective:   Jean Robinson is a 68 y.o. who presents for a Medicare Wellness preventive visit.  Visit Complete: Virtual I connected with  Jean Robinson on 04/02/23 by a audio enabled telemedicine application and verified that I am speaking with the correct person using two identifiers.  Patient Location: Home  Provider Location: Office/Clinic  I discussed the limitations of evaluation and management by telemedicine. The patient expressed understanding and agreed to proceed.  Vital Signs: Because this visit was a virtual/telehealth visit, some criteria may be missing or patient reported. Any vitals not documented were not able to be obtained and vitals that have been documented are patient reported.  VideoDeclined- This patient declined Librarian, academic. Therefore the visit was completed with audio only.  AWV Questionnaire: No: Patient Medicare AWV questionnaire was not completed prior to this visit.  Cardiac Risk Factors include: advanced age (>25men, >73 women);diabetes mellitus;dyslipidemia;hypertension     Objective:    Today's Vitals   04/02/23 1304 04/02/23 1305  Weight: 185 lb (83.9 kg)   Height: 5' 6.5" (1.689 m)   PainSc:  0-No pain   Body mass index is 29.41 kg/m.     04/02/2023    1:16 PM 11/22/2020    2:16 PM 01/24/2019   11:51 AM 12/08/2017    7:18 PM 07/27/2017    9:59 AM 09/19/2015    8:52 AM 10/30/2014    4:35 AM  Advanced Directives  Does Patient Have a Medical Advance Directive? No No No No No No Yes  Would patient like information on creating a medical advance directive?  No - Patient declined Yes (MAU/Ambulatory/Procedural Areas - Information given) No - Patient declined No - Patient declined      Current Medications (verified) Outpatient Encounter Medications as of 04/02/2023  Medication Sig   alendronate (FOSAMAX) 70 MG tablet TAKE 1 TABLET (70 MG TOTAL) BY MOUTH ONCE A WEEK   B-D ULTRAFINE III SHORT PEN 31G X 8 MM MISC USE  1 PEN AS DIRECTED   Blood Glucose Monitoring Suppl (ONETOUCH VERIO REFLECT) w/Device KIT Check blood sugar twice a day and as directed. Dx E11.65   cetirizine (ZYRTEC) 10 MG tablet Take 1 tablet (10 mg total) by mouth daily.   colchicine 0.6 MG tablet Take 1 tablet (0.6 mg total) by mouth 2 (two) times daily as needed.   Continuous Blood Gluc Receiver (FREESTYLE LIBRE 2 READER) DEVI Use to check blood sugar continuous due to low blood sugars at night.  Updated Rx   Continuous Glucose Sensor (FREESTYLE LIBRE 3 PLUS SENSOR) MISC 1 each by Does not apply route every 14 (fourteen) days.   fluticasone (FLONASE) 50 MCG/ACT nasal spray SPRAY 2 SPRAYS INTO EACH NOSTRIL EVERY DAY   glucose blood (ONETOUCH VERIO) test strip Use to check blood sugar twice daily   insulin aspart (NOVOLOG FLEXPEN) 100 UNIT/ML FlexPen USING SLIDING SCALE TO DETERMINE MEALTIME DOSE SUBCUTANEOUS INJECTION, MAX DOSE PER AY 20 UNITS   insulin glargine (LANTUS SOLOSTAR) 100 UNIT/ML Solostar Pen Inject 34 Units into the skin at bedtime.   Lancet Devices (ONE TOUCH DELICA LANCING DEV) MISC Check blood sugar twice a day.  Dx E11.65   latanoprost (XALATAN) 0.005 % ophthalmic solution Place 1 drop into both eyes at bedtime.    lisinopril (ZESTRIL) 40 MG tablet TAKE 1 TABLET BY MOUTH EVERY DAY   loperamide (IMODIUM) 2 MG capsule Take 2 mg by mouth as needed.   metFORMIN (GLUCOPHAGE-XR) 500 MG 24 hr  tablet TAKE 2 TABLETS BY MOUTH TWICE A DAY   montelukast (SINGULAIR) 10 MG tablet Take 1 tablet (10 mg total) by mouth at bedtime.   nitroGLYCERIN (NITRODUR - DOSED IN MG/24 HR) 0.2 mg/hr patch Place 1 patch (0.2 mg total) onto the skin daily. Apply to Achilles tendon   pravastatin (PRAVACHOL) 40 MG tablet TAKE 1 TABLET BY MOUTH EVERY DAY   sertraline (ZOLOFT) 25 MG tablet TAKE 1 TABLET (25 MG TOTAL) BY MOUTH DAILY.   SYSTANE ULTRA 0.4-0.3 % SOLN SMARTSIG:1 Drop(s) In Eye(s) As Needed   valACYclovir (VALTREX) 500 MG tablet TAKE 1 TABLET BY  MOUTH 2 TIMES DAILY, TAKE FOR 3 DAYS FOR ACUTE FLARE   benzonatate (TESSALON) 100 MG capsule Take 1 capsule (100 mg total) by mouth every 8 (eight) hours. Take this medication during the day (Patient not taking: Reported on 04/02/2023)   HYDROcodone bit-homatropine (HYCODAN) 5-1.5 MG/5ML syrup Take 5 mLs by mouth every 8 (eight) hours as needed for cough. Take this medication at night (Patient not taking: Reported on 04/02/2023)   No facility-administered encounter medications on file as of 04/02/2023.    Allergies (verified) Amlodipine   History: Past Medical History:  Diagnosis Date   Achilles tendonitis    Allergy    Anxiety    Closed fracture of lateral portion of right tibial plateau 07/30/2017   COVID 2021   Depression    Diabetes mellitus    H/O renal calculi 10/08/2009   Hyperlipidemia    Hypertension    Serum calcium elevated    STD (sexually transmitted disease)    HSV I & II, IgG reflex positive   Tibial plateau fracture, right    Past Surgical History:  Procedure Laterality Date   APPENDECTOMY     CHOLECYSTECTOMY     ORIF TIBIA PLATEAU Right 07/30/2017   Procedure: OPEN REDUCTION INTERNAL FIXATION (ORIF) TIBIAL PLATEAU;  Surgeon: Teryl Lucy, MD;  Location: [facility];  Service: Orthopedics;  Laterality: Right;   TONSILLECTOMY     Family History  Problem Relation Age of Onset   COPD Mother    Diabetes Mother    Macular degeneration Mother    Depression Father    Diabetes Father    Diabetes Brother    Cancer Other        liver   Diabetes Other    Social History   Socioeconomic History   Marital status: Married    Spouse name: Not on file   Number of children: 4   Years of education: Not on file   Highest education level: Not on file  Occupational History   Occupation: [employer]    Employer: [employer]  Tobacco Use   Smoking status: Never   Smokeless tobacco: Never  Vaping Use   Vaping status: Never Used  Substance and Sexual  Activity   Alcohol use: Yes    Alcohol/week: 0.0 - 2.0 standard drinks of alcohol    Comment: 2 drinks/month   Drug use: No   Sexual activity: Yes    Partners: Male    Birth control/protection: Post-menopausal  Other Topics Concern   Not on file  Social History Narrative   Regular exercise --yes, treadmill 15 min 3 days per week    Originally from [place] and moved here 30 years ago and went to college at [employer] with a degree in the child education   Occasional drinker and nonsmoker   Has 4 children   Lives with  her second husband   Social Drivers of Corporate investment banker Strain: Low Risk  (04/02/2023)   Overall Financial Resource Strain (CARDIA)    Difficulty of Paying Living Expenses: Not hard at all  Food Insecurity: No Food Insecurity (04/02/2023)   Hunger Vital Sign    Worried About Running Out of Food in the Last Year: Never true    Ran Out of Food in the Last Year: Never true  Transportation Needs: No Transportation Needs (04/02/2023)   PRAPARE - Administrator, Civil Service (Medical): No    Lack of Transportation (Non-Medical): No  Physical Activity: Insufficiently Active (04/02/2023)   Exercise Vital Sign    Days of Exercise per Week: 3 days    Minutes of Exercise per Session: 30 min  Stress: No Stress Concern Present (04/02/2023)   [employer] of Occupational Health - Occupational Stress Questionnaire    Feeling of Stress : Not at all  Social Connections: Socially Integrated (04/02/2023)   Social Connection and Isolation Panel [NHANES]    Frequency of Communication with Friends and Family: More than three times a week    Frequency of Social Gatherings with Friends and Family: More than three times a week    Attends Religious Services: More than 4 times per year    Active Member of [employer] or Organizations: Yes    Attends Engineer, structural: More than 4 times per year    Marital Status: Married    Tobacco  Counseling Counseling given: Not Answered  Clinical Intake:  Pre-visit preparation completed: Yes  Pain : No/denies pain Pain Score: 0-No pain    BMI - recorded: 29.41 Nutritional Status: BMI 25 -29 Overweight Nutritional Risks: None Diabetes: Yes CBG done?: Yes (BS 134 this am at home) CBG resulted in Enter/ Edit results?: No Did pt. bring in CBG monitor from home?: No  How often do you need to have someone help you when you read instructions, pamphlets, or other written materials from your doctor or pharmacy?: 1 - Never  Interpreter Needed?: No  Comments: lives with husband Information entered by :: B.Meyah Corle,LPN   Activities of Daily Living     04/02/2023    1:17 PM  In your present state of health, do you have any difficulty performing the following activities:  Hearing? 0  Vision? 0  Difficulty concentrating or making decisions? 0  Walking or climbing stairs? 0  Dressing or bathing? 0  Doing errands, shopping? 0  Preparing Food and eating ? N  Using the Toilet? N  In the past six months, have you accidently leaked urine? N  Do you have problems with loss of bowel control? N  Managing your Medications? N  Managing your Finances? N  Housekeeping or managing your Housekeeping? N    Patient Care Team: Excell Seltzer, MD as PCP - General [place], Heather, OD (Optometry)  Indicate any recent Medical Services you may have received from other than [facility] providers in the past year (date may be approximate).     Assessment:   This is a routine wellness examination for Jean Robinson.  Hearing/Vision screen Hearing Screening - Comments:: Pt says her hearing is good Vision Screening - Comments:: Pt says her vision is good after cataract surgery Dr [place]   Goals Addressed               This Visit's Progress     Patient Stated (pt-stated)        I need to  get more serious about not eating sweets and eliminating snack       Depression Screen     04/02/2023    1:12  PM 09/24/2022    2:04 PM 07/01/2022   10:10 AM 04/29/2022   11:34 AM 09/26/2021   10:31 AM 07/28/2021    4:34 PM 11/22/2020    2:17 PM  PHQ 2/9 Scores  PHQ - 2 Score 0 0 0 0 0 0 0  PHQ- 9 Score   2        Fall Risk     04/02/2023    1:07 PM 09/24/2022    2:04 PM 07/17/2022    2:06 PM 07/01/2022   10:10 AM 04/29/2022   11:30 AM  Fall Risk   Falls in the past year? 0 0 0 0 0  Number falls in past yr: 0 0 0 0 0  Injury with Fall? 0 0 0 0 0  Risk for fall due to : No Fall Risks No Fall Risks No Fall Risks No Fall Risks No Fall Risks  Follow up Education provided;Falls prevention discussed Falls evaluation completed Falls evaluation completed Falls evaluation completed Falls evaluation completed    MEDICARE RISK AT HOME:  Medicare Risk at Home Any stairs in or around the home?: Yes If so, are there any without handrails?: Yes Home free of loose throw rugs in walkways, pet beds, electrical cords, etc?: Yes Adequate lighting in your home to reduce risk of falls?: Yes Life alert?: No Use of a cane, walker or w/c?: No Grab bars in the bathroom?: Yes Shower chair or bench in shower?: Yes Elevated toilet seat or a handicapped toilet?: No  TIMED UP AND GO:  Was the test performed?  No  Cognitive Function: 6CIT completed        04/02/2023    1:20 PM  6CIT Screen  What Year? 0 points  What month? 0 points  What time? 0 points  Count back from 20 0 points  Months in reverse 0 points  Repeat phrase 0 points  Total Score 0 points    Immunizations Immunization History  Administered Date(s) Administered   Fluad Quad(high Dose 65+) 10/10/2021   Fluad Trivalent(High Dose 65+) 10/19/2022   Influenza Split 11/30/2012   Influenza Whole 11/18/2006, 11/30/2007, 11/12/2009   Influenza, High Dose Seasonal PF 10/01/2018, 10/30/2019, 10/15/2020   Influenza,inj,Quad PF,6+ Mos 11/16/2014, 10/10/2015, 10/01/2017   Influenza,inj,quad, With Preservative 11/25/2016   PFIZER(Purple  Top)SARS-COV-2 Vaccination 02/19/2019, 03/11/2019, 11/06/2019   PNEUMOCOCCAL CONJUGATE-20 01/07/2023   Pfizer Covid-19 Vaccine Bivalent Booster 66yrs & up 10/21/2020   Pneumococcal Conjugate-13 09/13/2018   Pneumococcal Polysaccharide-23 02/02/2002, 01/09/2020   Td 09/13/2018   Tdap 12/27/2006   Zoster Recombinant(Shingrix) 10/01/2018, 01/02/2019   Zoster, Live 10/10/2015    Screening Tests Health Maintenance  Topic Date Due   FOOT EXAM  09/27/2022   COVID-19 Vaccine (5 - 2024-25 season) 10/04/2022   Fecal DNA (Cologuard)  12/19/2022   HEMOGLOBIN A1C  04/06/2023   OPHTHALMOLOGY EXAM  06/17/2023   Diabetic kidney evaluation - eGFR measurement  10/07/2023   Diabetic kidney evaluation - Urine ACR  03/31/2024   Medicare Annual Wellness (AWV)  04/01/2024   MAMMOGRAM  07/14/2024   DTaP/Tdap/Td (3 - Td or Tdap) 09/12/2028   Pneumonia Vaccine 44+ Years old  Completed   INFLUENZA VACCINE  Completed   DEXA SCAN  Completed   Hepatitis C Screening  Completed   Zoster Vaccines- Shingrix  Completed   HPV VACCINES  Aged Out   Colonoscopy  Discontinued    Health Maintenance  Health Maintenance Due  Topic Date Due   FOOT EXAM  09/27/2022   COVID-19 Vaccine (5 - 2024-25 season) 10/04/2022   Fecal DNA (Cologuard)  12/19/2022   Health Maintenance Items Addressed: Mammogram ordered  Additional Screening:  Vision Screening: Recommended annual ophthalmology exams for early detection of glaucoma and other disorders of the eye.  Dental Screening: Recommended annual dental exams for proper oral hygiene  Community Resource Referral / Chronic Care Management: CRR required this visit?  No   CCM required this visit?  No    Plan:     I have personally reviewed and noted the following in the patient's chart:   Medical and social history Use of alcohol, tobacco or illicit drugs  Current medications and supplements including opioid prescriptions. Patient is not currently taking opioid  prescriptions. Functional ability and status Nutritional status Physical activity Advanced directives List of other physicians Hospitalizations, surgeries, and ER visits in previous 12 months Vitals Screenings to include cognitive, depression, and falls Referrals and appointments  In addition, I have reviewed and discussed with patient certain preventive protocols, quality metrics, and best practice recommendations. A written personalized care plan for preventive services as well as general preventive health recommendations were provided to patient.    Sue Lush, LPN   1/61/0960   After Visit Summary: (MyChart) Due to this being a telephonic visit, the after visit summary with patients personalized plan was offered to patient via MyChart   Notes: Nothing significant to report at this time.

## 2023-04-07 ENCOUNTER — Encounter: Payer: Self-pay | Admitting: Family Medicine

## 2023-04-07 ENCOUNTER — Ambulatory Visit (INDEPENDENT_AMBULATORY_CARE_PROVIDER_SITE_OTHER): Payer: PPO | Admitting: Family Medicine

## 2023-04-07 VITALS — BP 124/68 | HR 98 | Temp 98.3°F | Ht 66.0 in | Wt 188.2 lb

## 2023-04-07 DIAGNOSIS — E785 Hyperlipidemia, unspecified: Secondary | ICD-10-CM

## 2023-04-07 DIAGNOSIS — E1165 Type 2 diabetes mellitus with hyperglycemia: Secondary | ICD-10-CM

## 2023-04-07 DIAGNOSIS — Z0000 Encounter for general adult medical examination without abnormal findings: Secondary | ICD-10-CM | POA: Diagnosis not present

## 2023-04-07 DIAGNOSIS — I152 Hypertension secondary to endocrine disorders: Secondary | ICD-10-CM

## 2023-04-07 DIAGNOSIS — E1169 Type 2 diabetes mellitus with other specified complication: Secondary | ICD-10-CM | POA: Diagnosis not present

## 2023-04-07 DIAGNOSIS — E213 Hyperparathyroidism, unspecified: Secondary | ICD-10-CM

## 2023-04-07 DIAGNOSIS — E1159 Type 2 diabetes mellitus with other circulatory complications: Secondary | ICD-10-CM | POA: Diagnosis not present

## 2023-04-07 LAB — HM DIABETES FOOT EXAM

## 2023-04-07 NOTE — Progress Notes (Signed)
 Patient ID: Jean Robinson, female    DOB: Aug 07, 1955, 68 y.o.   MRN: [id number]  This visit was conducted in person.  BP 124/68   Pulse 98   Temp 98.3 F (36.8 C) (Oral)   Ht 5\' 6"  (1.676 m)   Wt 188 lb 3.2 oz (85.4 kg)   LMP 02/02/2006   SpO2 95%   BMI 30.38 kg/m    CC:  Chief Complaint  Patient presents with   Annual Exam    Subjective:   HPI: Jean Robinson is a 68 y.o. female presenting on 04/07/2023 for Annual Exam  The patient presents for  complete physical and review of chronic health problems. He/She also has the following acute concerns today: none  The patient saw a LPN or RN for medicare wellness visit.  March 25, 2023  Prevention and wellness was reviewed in detail. Note reviewed.  Flowsheet Row Office Visit from 04/07/2023 in [facility] [place]  PHQ-2 Total Score 0       Diabetes: Inadequate control  Reviewed recent Endo note from [place] [facility]  and Dr. Tedd Sias endocrinology from March 04, 2023  Using continuous glucose monitor  A1C 02/2023 10.1 Feet problems: no ulcers On metformin 1000 mg twice daily, Lantus 30 units nightly, NovoLog GI intolerance with GLP-1 eye exam within last year: yes   Elevated Cholesterol: LDL at goal on pravastatin 40 mg p.o. every other day Lab Results  Component Value Date   CHOL 133 04/01/2023   HDL 49.40 04/01/2023   LDLCALC 55 04/01/2023   TRIG 140.0 04/01/2023   CHOLHDL 3 04/01/2023  Using medications without problems: Muscle aches:  Diet compliance: moderate Exercise: biking, but not as much given weather. Other complaints:  Hypertension:  Well-controlled on lisinopril 40 mg p.o. daily BP Readings from Last 3 Encounters:  04/07/23 124/68  03/26/23 (!) 140/82  12/17/22 124/70  Using medication without problems or lightheadedness: none Chest pain with exertion: none Edema: some  in ight foot. Short of breath: none Average home BPs: Other issues:      Relevant past  medical, surgical, family and social history reviewed and updated as indicated. Interim medical history since our last visit reviewed. Allergies and medications reviewed and updated. Outpatient Medications Prior to Visit  Medication Sig Dispense Refill   alendronate (FOSAMAX) 70 MG tablet TAKE 1 TABLET (70 MG TOTAL) BY MOUTH ONCE A WEEK 12 tablet 3   B-D ULTRAFINE III SHORT PEN 31G X 8 MM MISC USE 1 PEN AS DIRECTED 100 each 3   benzonatate (TESSALON) 100 MG capsule Take 1 capsule (100 mg total) by mouth every 8 (eight) hours. Take this medication during the day (Patient taking differently: Take 100 mg by mouth every 8 (eight) hours. Take this medication during the day/PRN) 21 capsule 0   Blood Glucose Monitoring Suppl (ONETOUCH VERIO REFLECT) w/Device KIT Check blood sugar twice a day and as directed. Dx E11.65 1 kit 0   cetirizine (ZYRTEC) 10 MG tablet Take 1 tablet (10 mg total) by mouth daily. 30 tablet 11   colchicine 0.6 MG tablet Take 1 tablet (0.6 mg total) by mouth 2 (two) times daily as needed. 180 tablet 0   Continuous Blood Gluc Receiver (FREESTYLE LIBRE 2 READER) DEVI Use to check blood sugar continuous due to low blood sugars at night.  Updated Rx 1 each 0   Continuous Glucose Sensor (FREESTYLE LIBRE 3 PLUS SENSOR) MISC 1 each by Does not apply  route every 14 (fourteen) days. 12 each 3   fluticasone (FLONASE) 50 MCG/ACT nasal spray SPRAY 2 SPRAYS INTO EACH NOSTRIL EVERY DAY 48 mL 3   glucose blood (ONETOUCH VERIO) test strip Use to check blood sugar twice daily 100 strip 3   insulin aspart (NOVOLOG FLEXPEN) 100 UNIT/ML FlexPen USING SLIDING SCALE TO DETERMINE MEALTIME DOSE SUBCUTANEOUS INJECTION, MAX DOSE PER AY 20 UNITS 15 mL 3   insulin glargine (LANTUS SOLOSTAR) 100 UNIT/ML Solostar Pen Inject 34 Units into the skin at bedtime. 15 mL 11   Lancet Devices (ONE TOUCH DELICA LANCING DEV) MISC Check blood sugar twice a day.  Dx E11.65 1 each 0   latanoprost (XALATAN) 0.005 % ophthalmic  solution Place 1 drop into both eyes at bedtime.      lisinopril (ZESTRIL) 40 MG tablet TAKE 1 TABLET BY MOUTH EVERY DAY 90 tablet 0   loperamide (IMODIUM) 2 MG capsule Take 2 mg by mouth as needed.     metFORMIN (GLUCOPHAGE-XR) 500 MG 24 hr tablet TAKE 2 TABLETS BY MOUTH TWICE A DAY 360 tablet 1   montelukast (SINGULAIR) 10 MG tablet Take 1 tablet (10 mg total) by mouth at bedtime. 30 tablet 3   nitroGLYCERIN (NITRODUR - DOSED IN MG/24 HR) 0.2 mg/hr patch Place 1 patch (0.2 mg total) onto the skin daily. Apply to Achilles tendon 30 patch 0   pravastatin (PRAVACHOL) 40 MG tablet TAKE 1 TABLET BY MOUTH EVERY DAY 90 tablet 0   sertraline (ZOLOFT) 25 MG tablet TAKE 1 TABLET (25 MG TOTAL) BY MOUTH DAILY. 90 tablet 0   SYSTANE ULTRA 0.4-0.3 % SOLN SMARTSIG:1 Drop(s) In Eye(s) As Needed     valACYclovir (VALTREX) 500 MG tablet TAKE 1 TABLET BY MOUTH 2 TIMES DAILY, TAKE FOR 3 DAYS FOR ACUTE FLARE 180 tablet 0   HYDROcodone bit-homatropine (HYCODAN) 5-1.5 MG/5ML syrup Take 5 mLs by mouth every 8 (eight) hours as needed for cough. Take this medication at night (Patient not taking: Reported on 04/02/2023) 120 mL 0   No facility-administered medications prior to visit.     Per HPI unless specifically indicated in ROS section below Review of Systems  Constitutional:  Negative for fatigue and fever.  HENT:  Negative for congestion.   Eyes:  Negative for pain.  Respiratory:  Negative for cough and shortness of breath.   Cardiovascular:  Negative for chest pain, palpitations and leg swelling.  Gastrointestinal:  Negative for abdominal pain.  Genitourinary:  Negative for dysuria and vaginal bleeding.  Musculoskeletal:  Negative for back pain.  Neurological:  Negative for syncope, light-headedness and headaches.  Psychiatric/Behavioral:  Negative for dysphoric mood.    Objective:  BP 124/68   Pulse 98   Temp 98.3 F (36.8 C) (Oral)   Ht 5\' 6"  (1.676 m)   Wt 188 lb 3.2 oz (85.4 kg)   LMP 02/02/2006    SpO2 95%   BMI 30.38 kg/m   Wt Readings from Last 3 Encounters:  04/07/23 188 lb 3.2 oz (85.4 kg)  04/02/23 185 lb (83.9 kg)  10/07/22 185 lb 12.8 oz (84.3 kg)      Physical Exam Vitals and nursing note reviewed.  Constitutional:      General: She is not in acute distress.    Appearance: Normal appearance. She is well-developed. She is not ill-appearing or toxic-appearing.  HENT:     Head: Normocephalic.     Right Ear: Hearing, tympanic membrane, ear canal and external ear normal.  Left Ear: Hearing, tympanic membrane, ear canal and external ear normal.     Nose: Nose normal.  Eyes:     General: Lids are normal. Lids are everted, no foreign bodies appreciated.     Conjunctiva/sclera: Conjunctivae normal.     Pupils: Pupils are equal, round, and reactive to light.  Neck:     Thyroid: No thyroid mass or thyromegaly.     Vascular: No carotid bruit.     Trachea: Trachea normal.  Cardiovascular:     Rate and Rhythm: Normal rate and regular rhythm.     Heart sounds: Normal heart sounds, S1 normal and S2 normal. No murmur heard.    No gallop.  Pulmonary:     Effort: Pulmonary effort is normal. No respiratory distress.     Breath sounds: Normal breath sounds. No wheezing, rhonchi or rales.  Abdominal:     General: Bowel sounds are normal. There is no distension or abdominal bruit.     Palpations: Abdomen is soft. There is no fluid wave or mass.     Tenderness: There is no abdominal tenderness. There is no guarding or rebound.     Hernia: No hernia is present.  Musculoskeletal:     Cervical back: Normal range of motion and neck supple.  Lymphadenopathy:     Cervical: No cervical adenopathy.  Skin:    General: Skin is warm and dry.     Findings: No rash.  Neurological:     Mental Status: She is alert.     Cranial Nerves: No cranial nerve deficit.     Sensory: No sensory deficit.  Psychiatric:        Mood and Affect: Mood is not anxious or depressed.        Speech:  Speech normal.        Behavior: Behavior normal. Behavior is cooperative.        Judgment: Judgment normal.     Diabetic foot exam: Normal inspection No skin breakdown No calluses  Normal DP pulses Normal sensation to light touch and monofilament Nails normal     Results for orders placed or performed in visit on 04/07/23  HM DIABETES FOOT EXAM   Collection Time: 04/07/23 12:00 AM  Result Value Ref Range   HM Diabetic Foot Exam done      COVID 19 screen:  No recent travel or known exposure to COVID19 The patient denies respiratory symptoms of COVID 19 at this time. The importance of social distancing was discussed today.   Assessment and Plan The patient's preventative maintenance and recommended screening tests for an annual wellness exam were reviewed in full today. Brought up to date unless services declined.  Counselled on the importance of diet, exercise, and its role in overall health and mortality. The patient's FH and SH was reviewed, including their home life, tobacco status, and drug and alcohol status.   Vaccines:  S/P COVID x 3,   Shingrix, PNA, Tdap uptodate Pap/DVE:  GYN Mammo: GYN 07/2022 Bone Density:  06/2020 osteopenia, started on fosamax 04/2020., 07/2022  osteopenia plan repeat in 2 years.. plan stop fosamax in 07/2024 Colon: 2009 repeat in 10 years. Cologuard 12/2019, repeat due  Smoking Status: none ETOH/ drug NWG:NFAO/ZHYQ  Hep C: done  HIV screen:  refused   Problem List Items Addressed This Visit     Hyperlipidemia associated with type 2 diabetes mellitus (HCC)   Stable, chronic.  Continue current medication.  At new goal less than 70 on pravastatin 40 mg  p.o. daily.  Recommended increased dose to moderate high dose of atorvastatin.  She is hesitant and will consider. Lab Results  Component Value Date   CHOL 133 04/01/2023   HDL 49.40 04/01/2023   LDLCALC 55 04/01/2023   TRIG 140.0 04/01/2023   CHOLHDL 3 04/01/2023          Hyperparathyroidism (HCC)     Followed by ENDO  She has had bone density in 2022 showing mild osteopenia. She is currently on Fosamax 70 mg weekly.   Repeat in 2026 and plan stopping fosamax.      Hypertension associated with diabetes (HCC)   Chronic, now well-controlled, off meloxicam.    Continue lisinopril 40 mg p.o. daily.       Uncontrolled type 2 diabetes mellitus with hyperglycemia (HCC)    Chronic, very difficult to control DM with highs and lows frequently fluctuating.  Now followed by ENDO On metformin 1000 mg twice daily, Lantus 30 units nightly, NovoLog GI intolerance with GLP-1      Other Visit Diagnoses       Routine general medical examination at a health care facility    -  Primary        Kerby Nora, MD

## 2023-04-07 NOTE — Assessment & Plan Note (Signed)
Chronic, now well-controlled, off meloxicam.    Continue lisinopril 40 mg p.o. daily.  

## 2023-04-07 NOTE — Assessment & Plan Note (Signed)
 Stable, chronic.  Continue current medication.  At new goal less than 70 on pravastatin 40 mg p.o. daily.  Recommended increased dose to moderate high dose of atorvastatin.  She is hesitant and will consider. Lab Results  Component Value Date   CHOL 133 04/01/2023   HDL 49.40 04/01/2023   LDLCALC 55 04/01/2023   TRIG 140.0 04/01/2023   CHOLHDL 3 04/01/2023

## 2023-04-07 NOTE — Assessment & Plan Note (Addendum)
 Followed by ENDO  She has had bone density in 2022 showing mild osteopenia. She is currently on Fosamax 70 mg weekly.   Repeat in 2026 and plan stopping fosamax.

## 2023-04-07 NOTE — Assessment & Plan Note (Addendum)
 Chronic, very difficult to control DM with highs and lows frequently fluctuating.  Now followed by ENDO On metformin 1000 mg twice daily, Lantus 30 units nightly, NovoLog GI intolerance with GLP-1

## 2023-04-08 ENCOUNTER — Encounter: Payer: Self-pay | Admitting: Pharmacist

## 2023-04-08 ENCOUNTER — Ambulatory Visit: Payer: PPO | Attending: Cardiovascular Disease | Admitting: Pharmacist

## 2023-04-08 ENCOUNTER — Encounter: Payer: Self-pay | Admitting: Family Medicine

## 2023-04-08 VITALS — BP 134/78 | HR 94

## 2023-04-08 DIAGNOSIS — E1159 Type 2 diabetes mellitus with other circulatory complications: Secondary | ICD-10-CM

## 2023-04-08 DIAGNOSIS — I152 Hypertension secondary to endocrine disorders: Secondary | ICD-10-CM

## 2023-04-08 NOTE — Assessment & Plan Note (Signed)
 Assessment: BP in office BP 134/78 mmHg heart rate 94 ; goal (<130/80) Forgot to bring new home monitor for validation today  Takes and tolerates lisinopril well without any side effects Denies SOB, palpitation, chest pain, headaches,or swelling Have been vigilant on salt intake    Plan:  Continue taking lisinopril 40 mg daily and buy new BP monitor  Patient to bring home BP readings and new monitor at the next OV in 6 weeks  In future if BP remains above goal we will add low dose CCB -felodipine (amlodipine 5 mg lower extremities swelling- thiazide would be inappropriate baseline elevated Calcium)

## 2023-04-08 NOTE — Progress Notes (Signed)
 Patient ID: Jean Robinson                 DOB: 03-18-1955                      MRN: [id number]      HPI: Jean Robinson is a 68 y.o. female referred by Dr. Tenny Craw to HTN clinic. PMH is significant for hypertension, diabetes, arterial calcification, hyperparathyroidism, hx of gout, HLD, pyelonephritis.  At last visit with Dr.Ross BP was elevated (163/94 heart rate 81) amlodipine 5 mg was added to other BP medications patient was referred to hypertension clinic.   At last visit with PharmD lifestyle interventions were discussed. In office BP was near to goal. Today patient presented for follow up. Brought in home BP machine for validation found out to be inaccurate. Patient report she has been watching sodium intake more carefully then before. Denies dizziness, swelling, palpitation SOB or chest pain.  Today patient presented for follow up. She bought new BP monitor but forgot to bring it for validation. Home BP ~140-150/78-80 range HR 85-95. Yesterday at doctor's office her BP was 124/68. She admits that at home when she checks her BP she doesn't wait for 3-5 min. Reduced salt intake significantly. Not going for biking due to cold wether but stays active around the house. When weather permits she go for hiking with grandchildren.     Current HTN meds:  lisinopril 40 mg daily Previously tried: amlodipine 5 mg swelling of lower extremities  BP goal: <130/80  Family History:      Relation Problem Comments  Mother COPD   Diabetes   Macular degeneration     Father (Deceased) Depression   Diabetes     Brother Diabetes     Other - grandparents Cancer liver  Diabetes        Social History:  Alcohol: rarely 1 drink every 2 months  Smoking: never   Diet: low salt low fat diet   Exercise: occasional hiking when weather permits. Stays active gets 5K steps during the day    Wt Readings from Last 3 Encounters:  04/07/23 188 lb 3.2 oz (85.4 kg)  04/02/23 185 lb (83.9 kg)  10/07/22 185 lb 12.8 oz  (84.3 kg)   BP Readings from Last 3 Encounters:  04/08/23 134/78  04/07/23 124/68  03/26/23 (!) 140/82   Pulse Readings from Last 3 Encounters:  04/08/23 94  04/07/23 98  03/26/23 (!) 105    Renal function: CrCl cannot be calculated (Patient's most recent lab result is older than the maximum 21 days allowed.).  Past Medical History:  Diagnosis Date   Achilles tendonitis    Allergy    Anxiety    Closed fracture of lateral portion of right tibial plateau 07/30/2017   COVID 2021   Depression    Diabetes mellitus    H/O renal calculi 10/08/2009   Hyperlipidemia    Hypertension    Serum calcium elevated    STD (sexually transmitted disease)    HSV I & II, IgG reflex positive   Tibial plateau fracture, right     Current Outpatient Medications on File Prior to Visit  Medication Sig Dispense Refill   alendronate (FOSAMAX) 70 MG tablet TAKE 1 TABLET (70 MG TOTAL) BY MOUTH ONCE A WEEK 12 tablet 3   B-D ULTRAFINE III SHORT PEN 31G X 8 MM MISC USE 1 PEN AS DIRECTED 100 each 3   benzonatate (TESSALON) 100 MG capsule  Take 1 capsule (100 mg total) by mouth every 8 (eight) hours. Take this medication during the day (Patient taking differently: Take 100 mg by mouth every 8 (eight) hours. Take this medication during the day/PRN) 21 capsule 0   Blood Glucose Monitoring Suppl (ONETOUCH VERIO REFLECT) w/Device KIT Check blood sugar twice a day and as directed. Dx E11.65 1 kit 0   cetirizine (ZYRTEC) 10 MG tablet Take 1 tablet (10 mg total) by mouth daily. 30 tablet 11   colchicine 0.6 MG tablet Take 1 tablet (0.6 mg total) by mouth 2 (two) times daily as needed. 180 tablet 0   Continuous Blood Gluc Receiver (FREESTYLE LIBRE 2 READER) DEVI Use to check blood sugar continuous due to low blood sugars at night.  Updated Rx 1 each 0   Continuous Glucose Sensor (FREESTYLE LIBRE 3 PLUS SENSOR) MISC 1 each by Does not apply route every 14 (fourteen) days. 12 each 3   fluticasone (FLONASE) 50 MCG/ACT  nasal spray SPRAY 2 SPRAYS INTO EACH NOSTRIL EVERY DAY 48 mL 3   glucose blood (ONETOUCH VERIO) test strip Use to check blood sugar twice daily 100 strip 3   insulin aspart (NOVOLOG FLEXPEN) 100 UNIT/ML FlexPen USING SLIDING SCALE TO DETERMINE MEALTIME DOSE SUBCUTANEOUS INJECTION, MAX DOSE PER AY 20 UNITS 15 mL 3   insulin glargine (LANTUS SOLOSTAR) 100 UNIT/ML Solostar Pen Inject 34 Units into the skin at bedtime. 15 mL 11   Lancet Devices (ONE TOUCH DELICA LANCING DEV) MISC Check blood sugar twice a day.  Dx E11.65 1 each 0   latanoprost (XALATAN) 0.005 % ophthalmic solution Place 1 drop into both eyes at bedtime.      lisinopril (ZESTRIL) 40 MG tablet TAKE 1 TABLET BY MOUTH EVERY DAY 90 tablet 0   loperamide (IMODIUM) 2 MG capsule Take 2 mg by mouth as needed.     metFORMIN (GLUCOPHAGE-XR) 500 MG 24 hr tablet TAKE 2 TABLETS BY MOUTH TWICE A DAY 360 tablet 1   montelukast (SINGULAIR) 10 MG tablet Take 1 tablet (10 mg total) by mouth at bedtime. 30 tablet 3   nitroGLYCERIN (NITRODUR - DOSED IN MG/24 HR) 0.2 mg/hr patch Place 1 patch (0.2 mg total) onto the skin daily. Apply to Achilles tendon 30 patch 0   pravastatin (PRAVACHOL) 40 MG tablet TAKE 1 TABLET BY MOUTH EVERY DAY 90 tablet 0   sertraline (ZOLOFT) 25 MG tablet TAKE 1 TABLET (25 MG TOTAL) BY MOUTH DAILY. 90 tablet 0   SYSTANE ULTRA 0.4-0.3 % SOLN SMARTSIG:1 Drop(s) In Eye(s) As Needed     valACYclovir (VALTREX) 500 MG tablet TAKE 1 TABLET BY MOUTH 2 TIMES DAILY, TAKE FOR 3 DAYS FOR ACUTE FLARE 180 tablet 0   No current facility-administered medications on file prior to visit.    Allergies  Allergen Reactions   Amlodipine Swelling    Blood pressure 134/78, pulse 94, last menstrual period 02/02/2006, SpO2 98%.   Assessment/Plan:  1. Hypertension -  Hypertension associated with diabetes (HCC) Assessment: BP in office BP 134/78 mmHg heart rate 94 ; goal (<130/80) Forgot to bring new home monitor for validation today  Takes and  tolerates lisinopril well without any side effects Denies SOB, palpitation, chest pain, headaches,or swelling Have been vigilant on salt intake    Plan:  Continue taking lisinopril 40 mg daily and buy new BP monitor  Patient to bring home BP readings and new monitor at the next OV in 6 weeks  In future if BP remains  above goal we will add low dose CCB -felodipine (amlodipine 5 mg lower extremities swelling- thiazide would be inappropriate baseline elevated Calcium)    Thank you  Carmela Hurt, Pharm.D [facility] of [facility] [address] [place], [place] [address]  Phone: [phone]; Fax: [phone]

## 2023-04-08 NOTE — Patient Instructions (Signed)
 No Changes made by your pharmacist Carmela Hurt, PharmD at today's visit:     Bring your BP monitor for validation at your next appointment.    HOW TO TAKE YOUR BLOOD PRESSURE AT HOME  Rest 5 minutes before taking your blood pressure.  Don't smoke or drink caffeinated beverages for at least 30 minutes before. Take your blood pressure before (not after) you eat. Sit comfortably with your back supported and both feet on the floor (don't cross your legs). Elevate your arm to heart level on a table or a desk. Use the proper sized cuff. It should fit smoothly and snugly around your bare upper arm. There should be enough room to slip a fingertip under the cuff. The bottom edge of the cuff should be 1 inch above the crease of the elbow. Ideally, take 3 measurements at one sitting and record the average.  Important lifestyle changes to control high blood pressure  Intervention  Effect on the BP  Lose extra pounds and watch your waistline Weight loss is one of the most effective lifestyle changes for controlling blood pressure. If you're overweight or obese, losing even a small amount of weight can help reduce blood pressure. Blood pressure might go down by about 1 millimeter of mercury (mm Hg) with each kilogram (about 2.2 pounds) of weight lost.  Exercise regularly As a general goal, aim for at least 30 minutes of moderate physical activity every day. Regular physical activity can lower high blood pressure by about 5 to 8 mm Hg.  Eat a healthy diet Eating a diet rich in whole grains, fruits, vegetables, and low-fat dairy products and low in saturated fat and cholesterol. A healthy diet can lower high blood pressure by up to 11 mm Hg.  Reduce salt (sodium) in your diet Even a small reduction of sodium in the diet can improve heart health and reduce high blood pressure by about 5 to 6 mm Hg.  Limit alcohol One drink equals 12 ounces of beer, 5 ounces of wine, or 1.5 ounces of 80-proof liquor.   Limiting alcohol to less than one drink a day for women or two drinks a day for men can help lower blood pressure by about 4 mm Hg.   If you have any questions or concerns please use My Chart to send questions or call the office at [phone]

## 2023-04-21 DIAGNOSIS — Z1211 Encounter for screening for malignant neoplasm of colon: Secondary | ICD-10-CM | POA: Diagnosis not present

## 2023-04-25 LAB — COLOGUARD: COLOGUARD: NEGATIVE

## 2023-05-15 ENCOUNTER — Other Ambulatory Visit: Payer: Self-pay | Admitting: Family Medicine

## 2023-05-20 ENCOUNTER — Other Ambulatory Visit: Payer: Self-pay | Admitting: Family Medicine

## 2023-05-24 DIAGNOSIS — L853 Xerosis cutis: Secondary | ICD-10-CM | POA: Diagnosis not present

## 2023-05-24 DIAGNOSIS — H02831 Dermatochalasis of right upper eyelid: Secondary | ICD-10-CM | POA: Diagnosis not present

## 2023-05-24 DIAGNOSIS — L814 Other melanin hyperpigmentation: Secondary | ICD-10-CM | POA: Diagnosis not present

## 2023-05-24 DIAGNOSIS — L821 Other seborrheic keratosis: Secondary | ICD-10-CM | POA: Diagnosis not present

## 2023-05-24 DIAGNOSIS — H02834 Dermatochalasis of left upper eyelid: Secondary | ICD-10-CM | POA: Diagnosis not present

## 2023-05-24 DIAGNOSIS — D225 Melanocytic nevi of trunk: Secondary | ICD-10-CM | POA: Diagnosis not present

## 2023-05-24 DIAGNOSIS — D2271 Melanocytic nevi of right lower limb, including hip: Secondary | ICD-10-CM | POA: Diagnosis not present

## 2023-05-24 DIAGNOSIS — I872 Venous insufficiency (chronic) (peripheral): Secondary | ICD-10-CM | POA: Diagnosis not present

## 2023-05-24 DIAGNOSIS — L578 Other skin changes due to chronic exposure to nonionizing radiation: Secondary | ICD-10-CM | POA: Diagnosis not present

## 2023-05-26 ENCOUNTER — Other Ambulatory Visit: Payer: Self-pay | Admitting: Family Medicine

## 2023-05-28 ENCOUNTER — Encounter: Payer: Self-pay | Admitting: Family Medicine

## 2023-05-28 ENCOUNTER — Ambulatory Visit (INDEPENDENT_AMBULATORY_CARE_PROVIDER_SITE_OTHER): Admitting: Family Medicine

## 2023-05-28 VITALS — BP 110/68 | HR 96 | Temp 98.0°F | Ht 67.2 in | Wt 191.2 lb

## 2023-05-28 DIAGNOSIS — R197 Diarrhea, unspecified: Secondary | ICD-10-CM

## 2023-05-28 DIAGNOSIS — R1010 Upper abdominal pain, unspecified: Secondary | ICD-10-CM | POA: Diagnosis not present

## 2023-05-28 LAB — COMPREHENSIVE METABOLIC PANEL WITH GFR
ALT: 16 U/L (ref 0–35)
AST: 16 U/L (ref 0–37)
Albumin: 4.2 g/dL (ref 3.5–5.2)
Alkaline Phosphatase: 65 U/L (ref 39–117)
BUN: 23 mg/dL (ref 6–23)
CO2: 28 meq/L (ref 19–32)
Calcium: 10.4 mg/dL (ref 8.4–10.5)
Chloride: 101 meq/L (ref 96–112)
Creatinine, Ser: 1.05 mg/dL (ref 0.40–1.20)
GFR: 54.11 mL/min — ABNORMAL LOW (ref >60.00)
Glucose, Bld: 198 mg/dL — ABNORMAL HIGH (ref 70–99)
Potassium: 4.4 meq/L (ref 3.5–5.1)
Sodium: 137 meq/L (ref 135–145)
Total Bilirubin: 0.5 mg/dL (ref 0.2–1.2)
Total Protein: 7.2 g/dL (ref 6.0–8.3)

## 2023-05-28 LAB — CBC WITH DIFFERENTIAL/PLATELET
Basophils Absolute: 0.1 10*3/uL (ref 0.0–0.1)
Basophils Relative: 0.7 % (ref 0.0–3.0)
Eosinophils Absolute: 0.4 10*3/uL (ref 0.0–0.7)
Eosinophils Relative: 4.2 % (ref 0.0–5.0)
HCT: 41 % (ref 36.0–46.0)
Hemoglobin: 13.6 g/dL (ref 12.0–15.0)
Lymphocytes Relative: 40.5 % (ref 12.0–46.0)
Lymphs Abs: 3.6 10*3/uL (ref 0.7–4.0)
MCHC: 33.1 g/dL (ref 30.0–36.0)
MCV: 95.7 fl (ref 78.0–100.0)
Monocytes Absolute: 0.6 10*3/uL (ref 0.1–1.0)
Monocytes Relative: 6.4 % (ref 3.0–12.0)
Neutro Abs: 4.3 10*3/uL (ref 1.4–7.7)
Neutrophils Relative %: 48.2 % (ref 43.0–77.0)
Platelets: 292 10*3/uL (ref 150.0–400.0)
RBC: 4.28 Mil/uL (ref 3.87–5.11)
RDW: 13 % (ref 11.5–15.5)
WBC: 9 10*3/uL (ref 4.0–10.5)

## 2023-05-28 LAB — LIPASE: Lipase: 34 U/L (ref 11.0–59.0)

## 2023-05-28 NOTE — Patient Instructions (Signed)
 Please stop at the lab to have labs drawn.  Avoid greasy foods, decrease stress. Start fiber supplement ( Benefiber) and increase fiber in diet. Can try a probiotic .Aaron Aas Align, or Culturelle. Keep a food diary... to determine dairy trigger or wheat trigger.

## 2023-05-28 NOTE — Progress Notes (Signed)
 Patient ID: Jean Robinson, female    DOB: Oct 31, 1955, 68 y.o.   MRN: [id number]  This visit was conducted in person.  BP 110/68 (BP Location: Right Arm, Patient Position: Sitting, Cuff Size: Normal)   Pulse 96   Temp 98 F (36.7 C) (Temporal)   Ht 5' 7.2" (1.707 m)   Wt 191 lb 4 oz (86.8 kg)   LMP 02/02/2006   SpO2 96%   BMI 29.78 kg/m    CC:  Chief Complaint  Patient presents with   Abdominal Pain    RUQ mostly but sometimes occurs on Left side as well   Diarrhea    Subjective:   HPI: Jean Robinson is a 68 y.o. female  with DM presenting on 05/28/2023 for Abdominal Pain (RUQ mostly but sometimes occurs on Left side as well) and Diarrhea  New onset pain in RUQ, sometimes LUQ  Started about month ago, intermittent, occurring every 2-3 daysa.. last 1-2 days.  No diet change. No new med change.   Associated with  increase of diarrhea, soft... occ after eating rush to the bathroom, but only 1-2 a day.  Possibly worse with greasy food.    History of kidney stone, s/p cholecystectomy, appendectomy. Poor control DM.. followed by ENDO.. on metformin  and  insulin . Lab Results  Component Value Date   HGBA1C 10.1 03/04/2023    No recent antibiotics.  Relevant past medical, surgical, family and social history reviewed and updated as indicated. Interim medical history since our last visit reviewed. Allergies and medications reviewed and updated. Outpatient Medications Prior to Visit  Medication Sig Dispense Refill   alendronate  (FOSAMAX ) 70 MG tablet TAKE 1 TABLET (70 MG TOTAL) BY MOUTH ONCE A WEEK 12 tablet 3   B-D ULTRAFINE III SHORT PEN 31G X 8 MM MISC USE 1 PEN AS DIRECTED 100 each 3   Blood Glucose Monitoring Suppl (ONETOUCH VERIO REFLECT) w/Device KIT Check blood sugar twice a day and as directed. Dx E11.65 1 kit 0   cetirizine  (ZYRTEC ) 10 MG tablet Take 1 tablet (10 mg total) by mouth daily. 30 tablet 11   colchicine  0.6 MG tablet Take 1 tablet (0.6 mg total) by mouth 2 (two)  times daily as needed. 180 tablet 0   Continuous Blood Gluc Receiver (FREESTYLE LIBRE 2 READER) DEVI Use to check blood sugar continuous due to low blood sugars at night.  Updated Rx 1 each 0   Continuous Glucose Sensor (FREESTYLE LIBRE 3 PLUS SENSOR) MISC 1 each by Does not apply route every 14 (fourteen) days. 12 each 3   fluticasone  (FLONASE ) 50 MCG/ACT nasal spray SPRAY 2 SPRAYS INTO EACH NOSTRIL EVERY DAY 48 mL 3   glucose blood (ONETOUCH VERIO) test strip Use to check blood sugar twice daily 100 strip 3   insulin  aspart (NOVOLOG  FLEXPEN) 100 UNIT/ML FlexPen USING SLIDING SCALE TO DETERMINE MEALTIME DOSE SUBCUTANEOUS INJECTION, MAX DOSE PER AY 20 UNITS 15 mL 3   insulin  glargine (LANTUS  SOLOSTAR) 100 UNIT/ML Solostar Pen Inject 34 Units into the skin at bedtime. 15 mL 11   Lancet Devices (ONE TOUCH DELICA LANCING DEV) MISC Check blood sugar twice a day.  Dx E11.65 1 each 0   latanoprost (XALATAN) 0.005 % ophthalmic solution Place 1 drop into both eyes at bedtime.      lisinopril  (ZESTRIL ) 40 MG tablet TAKE 1 TABLET BY MOUTH EVERY DAY 90 tablet 0   loperamide (IMODIUM) 2 MG capsule Take 2 mg by mouth as needed.  metFORMIN  (GLUCOPHAGE -XR) 500 MG 24 hr tablet TAKE 2 TABLETS BY MOUTH TWICE A DAY 360 tablet 1   nitroGLYCERIN  (NITRODUR - DOSED IN MG/24 HR) 0.2 mg/hr patch Place 1 patch (0.2 mg total) onto the skin daily. Apply to Achilles tendon 30 patch 0   pravastatin  (PRAVACHOL ) 40 MG tablet TAKE 1 TABLET BY MOUTH EVERY DAY 90 tablet 3   sertraline  (ZOLOFT ) 25 MG tablet TAKE 1 TABLET (25 MG TOTAL) BY MOUTH DAILY. 90 tablet 0   SYSTANE ULTRA 0.4-0.3 % SOLN SMARTSIG:1 Drop(s) In Eye(s) As Needed     valACYclovir  (VALTREX ) 500 MG tablet TAKE 1 TABLET BY MOUTH 2 TIMES DAILY, TAKE FOR 3 DAYS FOR ACUTE FLARE 180 tablet 0   benzonatate  (TESSALON ) 100 MG capsule Take 1 capsule (100 mg total) by mouth every 8 (eight) hours. Take this medication during the day (Patient taking differently: Take 100 mg by  mouth every 8 (eight) hours. Take this medication during the day/PRN) 21 capsule 0   montelukast  (SINGULAIR ) 10 MG tablet Take 1 tablet (10 mg total) by mouth at bedtime. 30 tablet 3   No facility-administered medications prior to visit.     Per HPI unless specifically indicated in ROS section below Review of Systems  Constitutional:  Negative for fatigue and fever.  HENT:  Negative for congestion.   Eyes:  Negative for pain.  Respiratory:  Negative for cough and shortness of breath.   Cardiovascular:  Negative for chest pain, palpitations and leg swelling.  Gastrointestinal:  Positive for abdominal pain.  Genitourinary:  Negative for dysuria and vaginal bleeding.  Musculoskeletal:  Negative for back pain.  Neurological:  Negative for syncope, light-headedness and headaches.  Psychiatric/Behavioral:  Negative for dysphoric mood.    Objective:  BP 110/68 (BP Location: Right Arm, Patient Position: Sitting, Cuff Size: Normal)   Pulse 96   Temp 98 F (36.7 C) (Temporal)   Ht 5' 7.2" (1.707 m)   Wt 191 lb 4 oz (86.8 kg)   LMP 02/02/2006   SpO2 96%   BMI 29.78 kg/m   Wt Readings from Last 3 Encounters:  05/28/23 191 lb 4 oz (86.8 kg)  04/07/23 188 lb 3.2 oz (85.4 kg)  04/02/23 185 lb (83.9 kg)      Physical Exam Constitutional:      General: She is not in acute distress.    Appearance: Normal appearance. She is well-developed. She is not ill-appearing or toxic-appearing.  HENT:     Head: Normocephalic.     Right Ear: Hearing, tympanic membrane, ear canal and external ear normal. Tympanic membrane is not erythematous, retracted or bulging.     Left Ear: Hearing, tympanic membrane, ear canal and external ear normal. Tympanic membrane is not erythematous, retracted or bulging.     Nose: No mucosal edema or rhinorrhea.     Right Sinus: No maxillary sinus tenderness or frontal sinus tenderness.     Left Sinus: No maxillary sinus tenderness or frontal sinus tenderness.      Mouth/Throat:     Pharynx: Uvula midline.  Eyes:     General: Lids are normal. Lids are everted, no foreign bodies appreciated.     Conjunctiva/sclera: Conjunctivae normal.     Pupils: Pupils are equal, round, and reactive to light.  Neck:     Thyroid : No thyroid  mass or thyromegaly.     Vascular: No carotid bruit.     Trachea: Trachea normal.  Cardiovascular:     Rate and Rhythm: Normal rate and  regular rhythm.     Pulses: Normal pulses.     Heart sounds: Normal heart sounds, S1 normal and S2 normal. No murmur heard.    No friction rub. No gallop.  Pulmonary:     Effort: Pulmonary effort is normal. No tachypnea or respiratory distress.     Breath sounds: Normal breath sounds. No decreased breath sounds, wheezing, rhonchi or rales.  Abdominal:     General: Bowel sounds are normal.     Palpations: Abdomen is soft.     Tenderness: There is no abdominal tenderness.  Musculoskeletal:     Cervical back: Normal range of motion and neck supple.  Skin:    General: Skin is warm and dry.     Findings: No rash.  Neurological:     Mental Status: She is alert.  Psychiatric:        Mood and Affect: Mood is not anxious or depressed.        Speech: Speech normal.        Behavior: Behavior normal. Behavior is cooperative.        Thought Content: Thought content normal.        Judgment: Judgment normal.       Results for orders placed or performed in visit on 05/28/23  Hemoglobin A1c   Collection Time: 03/04/23 12:00 AM  Result Value Ref Range   Hemoglobin A1C 10.1     Assessment and Plan  Upper abdominal pain -     Comprehensive metabolic panel with GFR -     Lipase -     CBC with Differential/Platelet  Intermittent diarrhea -     Comprehensive metabolic panel with GFR -     Lipase -     CBC with Differential/Platelet  Acute, symptoms most consistent with irritable bowel syndrome versus bacterial overgrowth. We discussed keeping a food diary to determine if there is any  possible intolerance. We will evaluate with labs to rule out liver, kidney, electrolyte issue or pancreatitis although I think this is unlikely. She is status postcholecystectomy and appendectomy. Recommended increasing fiber in diet, water, probiotic trial and if still not improving consider FODMAP diet. She will let me know if it is not improving and we will review FODMAP diet and possible trial of Bentyl versus GI referral.  No follow-ups on file.   Herby Lolling, MD

## 2023-05-31 NOTE — Progress Notes (Deleted)
 Patient ID: Jean Robinson                 DOB: May 26, 1955                      MRN: [id number]      HPI: KEMARI LOGA is a 68 y.o. female referred by Dr. Avanell Bob to HTN clinic. PMH is significant for hypertension, diabetes, arterial calcification, hyperparathyroidism, hx of gout, HLD, pyelonephritis.  At last visit with Dr.Ross BP was elevated (163/94 heart rate 81) amlodipine  5 mg was added to other BP medications patient was referred to hypertension clinic.   At last visit with PharmD lifestyle interventions were discussed. In office BP was near to goal. Today patient presented for follow up. Brought in home BP machine for validation found out to be inaccurate. Patient report she has been watching sodium intake more carefully then before. Denies dizziness, swelling, palpitation SOB or chest pain.  Today patient presented for follow up. She bought new BP monitor but forgot to bring it for validation. Home BP ~140-150/78-80 range HR 85-95. Yesterday at doctor's office her BP was 124/68. She admits that at home when she checks her BP she doesn't wait for 3-5 min. Reduced salt intake significantly. Not going for biking due to cold weather but stays active around the house. When weather permits she go for hiking with grandchildren.     Current HTN meds:  lisinopril  40 mg daily Previously tried: amlodipine  5 mg swelling of lower extremities  BP goal: <130/80  Family History:      Relation Problem Comments  Mother COPD   Diabetes   Macular degeneration     Father (Deceased) Depression   Diabetes     Brother Diabetes     Other - grandparents Cancer liver  Diabetes        Social History:  Alcohol: rarely 1 drink every 2 months  Smoking: never   Diet: low salt low fat diet   Exercise: occasional hiking when weather permits. Stays active gets 5K steps during the day    Wt Readings from Last 3 Encounters:  05/28/23 191 lb 4 oz (86.8 kg)  04/07/23 188 lb 3.2 oz (85.4 kg)  04/02/23 185 lb  (83.9 kg)   BP Readings from Last 3 Encounters:  05/28/23 110/68  04/08/23 134/78  04/07/23 124/68   Pulse Readings from Last 3 Encounters:  05/28/23 96  04/08/23 94  04/07/23 98    Renal function: Estimated Creatinine Clearance: 57.5 mL/min (by C-G formula based on SCr of 1.05 mg/dL).  Past Medical History:  Diagnosis Date   Achilles tendonitis    Allergy    Anxiety    Closed fracture of lateral portion of right tibial plateau 07/30/2017   COVID 2021   Depression    Diabetes mellitus    H/O renal calculi 10/08/2009   Hyperlipidemia    Hypertension    Serum calcium elevated    STD (sexually transmitted disease)    HSV I & II, IgG reflex positive   Tibial plateau fracture, right     Current Outpatient Medications on File Prior to Visit  Medication Sig Dispense Refill   alendronate  (FOSAMAX ) 70 MG tablet TAKE 1 TABLET (70 MG TOTAL) BY MOUTH ONCE A WEEK 12 tablet 3   B-D ULTRAFINE III SHORT PEN 31G X 8 MM MISC USE 1 PEN AS DIRECTED 100 each 3   Blood Glucose Monitoring Suppl (ONETOUCH VERIO REFLECT) w/Device KIT Check  blood sugar twice a day and as directed. Dx E11.65 1 kit 0   cetirizine  (ZYRTEC ) 10 MG tablet Take 1 tablet (10 mg total) by mouth daily. 30 tablet 11   colchicine  0.6 MG tablet Take 1 tablet (0.6 mg total) by mouth 2 (two) times daily as needed. 180 tablet 0   Continuous Blood Gluc Receiver (FREESTYLE LIBRE 2 READER) DEVI Use to check blood sugar continuous due to low blood sugars at night.  Updated Rx 1 each 0   Continuous Glucose Sensor (FREESTYLE LIBRE 3 PLUS SENSOR) MISC 1 each by Does not apply route every 14 (fourteen) days. 12 each 3   fluticasone  (FLONASE ) 50 MCG/ACT nasal spray SPRAY 2 SPRAYS INTO EACH NOSTRIL EVERY DAY 48 mL 3   glucose blood (ONETOUCH VERIO) test strip Use to check blood sugar twice daily 100 strip 3   insulin  aspart (NOVOLOG  FLEXPEN) 100 UNIT/ML FlexPen USING SLIDING SCALE TO DETERMINE MEALTIME DOSE SUBCUTANEOUS INJECTION, MAX DOSE  PER AY 20 UNITS 15 mL 3   insulin  glargine (LANTUS  SOLOSTAR) 100 UNIT/ML Solostar Pen Inject 34 Units into the skin at bedtime. 15 mL 11   Lancet Devices (ONE TOUCH DELICA LANCING DEV) MISC Check blood sugar twice a day.  Dx E11.65 1 each 0   latanoprost (XALATAN) 0.005 % ophthalmic solution Place 1 drop into both eyes at bedtime.      lisinopril  (ZESTRIL ) 40 MG tablet TAKE 1 TABLET BY MOUTH EVERY DAY 90 tablet 0   loperamide (IMODIUM) 2 MG capsule Take 2 mg by mouth as needed.     metFORMIN  (GLUCOPHAGE -XR) 500 MG 24 hr tablet TAKE 2 TABLETS BY MOUTH TWICE A DAY 360 tablet 1   nitroGLYCERIN  (NITRODUR - DOSED IN MG/24 HR) 0.2 mg/hr patch Place 1 patch (0.2 mg total) onto the skin daily. Apply to Achilles tendon 30 patch 0   pravastatin  (PRAVACHOL ) 40 MG tablet TAKE 1 TABLET BY MOUTH EVERY DAY 90 tablet 3   sertraline  (ZOLOFT ) 25 MG tablet TAKE 1 TABLET (25 MG TOTAL) BY MOUTH DAILY. 90 tablet 0   SYSTANE ULTRA 0.4-0.3 % SOLN SMARTSIG:1 Drop(s) In Eye(s) As Needed     valACYclovir  (VALTREX ) 500 MG tablet TAKE 1 TABLET BY MOUTH 2 TIMES DAILY, TAKE FOR 3 DAYS FOR ACUTE FLARE 180 tablet 0   No current facility-administered medications on file prior to visit.    Allergies  Allergen Reactions   Amlodipine  Swelling    Last menstrual period 02/02/2006.   Assessment/Plan:  1. Hypertension -  No problem-specific Assessment & Plan notes found for this encounter.   Thank you  Nickola Baron, Pharm.D  [facility] [address], [place], [place] [address]  Phone: [phone]; Fax: [phone]

## 2023-06-02 ENCOUNTER — Ambulatory Visit: Admitting: Pharmacist

## 2023-06-04 ENCOUNTER — Other Ambulatory Visit: Payer: Self-pay | Admitting: Family Medicine

## 2023-06-04 DIAGNOSIS — I10 Essential (primary) hypertension: Secondary | ICD-10-CM

## 2023-06-09 ENCOUNTER — Other Ambulatory Visit: Payer: Self-pay | Admitting: Family Medicine

## 2023-06-13 ENCOUNTER — Other Ambulatory Visit: Payer: Self-pay | Admitting: Family Medicine

## 2023-07-01 ENCOUNTER — Encounter: Payer: HMO | Admitting: Obstetrics and Gynecology

## 2023-07-08 DIAGNOSIS — E119 Type 2 diabetes mellitus without complications: Secondary | ICD-10-CM | POA: Diagnosis not present

## 2023-07-08 LAB — HM DIABETES EYE EXAM

## 2023-07-14 ENCOUNTER — Ambulatory Visit: Admitting: Family Medicine

## 2023-07-15 ENCOUNTER — Ambulatory Visit: Admitting: Family Medicine

## 2023-07-21 DIAGNOSIS — Z1231 Encounter for screening mammogram for malignant neoplasm of breast: Secondary | ICD-10-CM | POA: Diagnosis not present

## 2023-07-21 LAB — HM MAMMOGRAPHY

## 2023-07-22 ENCOUNTER — Ambulatory Visit: Payer: Self-pay | Admitting: Primary Care

## 2023-07-22 ENCOUNTER — Other Ambulatory Visit: Payer: Self-pay | Admitting: Family Medicine

## 2023-07-22 DIAGNOSIS — E1165 Type 2 diabetes mellitus with hyperglycemia: Secondary | ICD-10-CM

## 2023-07-23 NOTE — Telephone Encounter (Signed)
 Copied from CRM [id number]. Topic: Clinical - Prescription Issue >> Jul 23, 2023 10:02 AM Alyse July wrote: Reason for CRM: Patient called to check the status of medication refill BD PEN NEEDLE SHORT ULTRAFINE 31G X 8 MM MISC. Medication still currently pending and patient is headed out of town later today and would like to pick up needles prior to. Please contact patient to advise CB# [phone] a message in MyChart will suffice as well once medication is submitted to the pharmacy.

## 2023-08-03 ENCOUNTER — Ambulatory Visit (INDEPENDENT_AMBULATORY_CARE_PROVIDER_SITE_OTHER): Admitting: Family Medicine

## 2023-08-03 ENCOUNTER — Ambulatory Visit (INDEPENDENT_AMBULATORY_CARE_PROVIDER_SITE_OTHER)
Admission: RE | Admit: 2023-08-03 | Discharge: 2023-08-03 | Disposition: A | Discharge status: Home / Self Care | Source: Ambulatory Visit | Attending: Family Medicine | Admitting: Family Medicine

## 2023-08-03 ENCOUNTER — Other Ambulatory Visit: Payer: Self-pay | Admitting: Family Medicine

## 2023-08-03 ENCOUNTER — Encounter: Payer: Self-pay | Admitting: Family Medicine

## 2023-08-03 VITALS — BP 110/66 | HR 84 | Temp 98.8°F | Ht 67.2 in | Wt 193.1 lb

## 2023-08-03 DIAGNOSIS — M25562 Pain in left knee: Secondary | ICD-10-CM | POA: Diagnosis not present

## 2023-08-03 DIAGNOSIS — M1712 Unilateral primary osteoarthritis, left knee: Secondary | ICD-10-CM | POA: Diagnosis not present

## 2023-08-03 DIAGNOSIS — M25462 Effusion, left knee: Secondary | ICD-10-CM | POA: Diagnosis not present

## 2023-08-03 NOTE — Progress Notes (Signed)
 Patient ID: Jean Robinson, female    DOB: 01/22/1956, 68 y.o.   MRN: [id number]  This visit was conducted in person.  BP 110/66   Pulse 84   Temp 98.8 F (37.1 C) (Temporal)   Ht 5' 7.2 (1.707 m)   Wt 193 lb 2 oz (87.6 kg)   LMP 02/02/2006   SpO2 99%   BMI 30.07 kg/m    CC:  Chief Complaint  Patient presents with   Joint Swelling    Bilateral-Worse at Night-Left is worse    Subjective:   HPI: Jean Robinson is a 68 y.o. female presenting on 08/03/2023 for Joint Swelling (Bilateral-Worse at Night-Left is worse)   Bilateral left > right joint pain and swelling  PAin 2-3/10  Started 2-3 weeks ago   No known fall or injury. No change in activity.  Knee tightness and swelling, stiffness.  No redness  No fever  No flulike symptoms.   Has been using elevating and icing.    Hx of gout, osteopenia  Relevant past medical, surgical, family and social history reviewed and updated as indicated. Interim medical history since our last visit reviewed. Allergies and medications reviewed and updated. Outpatient Medications Prior to Visit  Medication Sig Dispense Refill   alendronate  (FOSAMAX ) 70 MG tablet TAKE 1 TABLET (70 MG TOTAL) BY MOUTH ONCE A WEEK 12 tablet 3   BD PEN NEEDLE SHORT ULTRAFINE 31G X 8 MM MISC USE 1 PEN AS DIRECTED 100 each 3   Blood Glucose Monitoring Suppl (ONETOUCH VERIO REFLECT) w/Device KIT Check blood sugar twice a day and as directed. Dx E11.65 1 kit 0   cetirizine  (ZYRTEC ) 10 MG tablet Take 1 tablet (10 mg total) by mouth daily. 30 tablet 11   colchicine  0.6 MG tablet Take 1 tablet (0.6 mg total) by mouth 2 (two) times daily as needed. 180 tablet 0   Continuous Blood Gluc Receiver (FREESTYLE LIBRE 2 READER) DEVI Use to check blood sugar continuous due to low blood sugars at night.  Updated Rx 1 each 0   Continuous Glucose Sensor (FREESTYLE LIBRE 3 PLUS SENSOR) MISC 1 each by Does not apply route every 14 (fourteen) days. 12 each 3   fluticasone  (FLONASE ) 50  MCG/ACT nasal spray SPRAY 2 SPRAYS INTO EACH NOSTRIL EVERY DAY 48 mL 3   glucose blood (ONETOUCH VERIO) test strip Use to check blood sugar twice daily 100 strip 3   insulin  aspart (NOVOLOG  FLEXPEN) 100 UNIT/ML FlexPen USING SLIDING SCALE TO DETERMINE MEALTIME DOSE SUBCUTANEOUS INJECTION, MAX DOSE PER AY 20 UNITS 15 mL 3   insulin  glargine (LANTUS  SOLOSTAR) 100 UNIT/ML Solostar Pen Inject 34 Units into the skin at bedtime. 15 mL 11   Lancet Devices (ONE TOUCH DELICA LANCING DEV) MISC Check blood sugar twice a day.  Dx E11.65 1 each 0   latanoprost (XALATAN) 0.005 % ophthalmic solution Place 1 drop into both eyes at bedtime.      lisinopril  (ZESTRIL ) 40 MG tablet TAKE 1 TABLET BY MOUTH EVERY DAY 90 tablet 3   loperamide (IMODIUM) 2 MG capsule Take 2 mg by mouth as needed.     metFORMIN  (GLUCOPHAGE -XR) 500 MG 24 hr tablet TAKE 2 TABLETS BY MOUTH TWICE A DAY 360 tablet 1   nitroGLYCERIN  (NITRODUR - DOSED IN MG/24 HR) 0.2 mg/hr patch Place 1 patch (0.2 mg total) onto the skin daily. Apply to Achilles tendon 30 patch 0   pravastatin  (PRAVACHOL ) 40 MG tablet TAKE 1 TABLET BY  MOUTH EVERY DAY 90 tablet 3   sertraline  (ZOLOFT ) 25 MG tablet TAKE 1 TABLET (25 MG TOTAL) BY MOUTH DAILY. 90 tablet 1   SYSTANE ULTRA 0.4-0.3 % SOLN SMARTSIG:1 Drop(s) In Eye(s) As Needed     valACYclovir  (VALTREX ) 500 MG tablet TAKE 1 TABLET BY MOUTH 2 TIMES DAILY, TAKE FOR 3 DAYS FOR ACUTE FLARE 180 tablet 0   No facility-administered medications prior to visit.     Per HPI unless specifically indicated in ROS section below Review of Systems  Constitutional:  Negative for fatigue and fever.  HENT:  Negative for congestion.   Eyes:  Negative for pain.  Respiratory:  Negative for cough and shortness of breath.   Cardiovascular:  Negative for chest pain, palpitations and leg swelling.  Gastrointestinal:  Negative for abdominal pain.  Genitourinary:  Negative for dysuria and vaginal bleeding.  Musculoskeletal:  Negative for  back pain.  Neurological:  Negative for syncope, light-headedness and headaches.  Psychiatric/Behavioral:  Negative for dysphoric mood.    Objective:  BP 110/66   Pulse 84   Temp 98.8 F (37.1 C) (Temporal)   Ht 5' 7.2 (1.707 m)   Wt 193 lb 2 oz (87.6 kg)   LMP 02/02/2006   SpO2 99%   BMI 30.07 kg/m   Wt Readings from Last 3 Encounters:  08/03/23 193 lb 2 oz (87.6 kg)  05/28/23 191 lb 4 oz (86.8 kg)  04/07/23 188 lb 3.2 oz (85.4 kg)      Physical Exam Constitutional:      General: She is not in acute distress.    Appearance: Normal appearance. She is well-developed. She is not ill-appearing or toxic-appearing.  HENT:     Head: Normocephalic.     Right Ear: Hearing, tympanic membrane, ear canal and external ear normal. Tympanic membrane is not erythematous, retracted or bulging.     Left Ear: Hearing, tympanic membrane, ear canal and external ear normal. Tympanic membrane is not erythematous, retracted or bulging.     Nose: No mucosal edema or rhinorrhea.     Right Sinus: No maxillary sinus tenderness or frontal sinus tenderness.     Left Sinus: No maxillary sinus tenderness or frontal sinus tenderness.     Mouth/Throat:     Pharynx: Uvula midline.   Eyes:     General: Lids are normal. Lids are everted, no foreign bodies appreciated.     Conjunctiva/sclera: Conjunctivae normal.     Pupils: Pupils are equal, round, and reactive to light.   Neck:     Thyroid : No thyroid  mass or thyromegaly.     Vascular: No carotid bruit.     Trachea: Trachea normal.   Cardiovascular:     Rate and Rhythm: Normal rate and regular rhythm.     Pulses: Normal pulses.     Heart sounds: Normal heart sounds, S1 normal and S2 normal. No murmur heard.    No friction rub. No gallop.  Pulmonary:     Effort: Pulmonary effort is normal. No tachypnea or respiratory distress.     Breath sounds: Normal breath sounds. No decreased breath sounds, wheezing, rhonchi or rales.  Abdominal:     General:  Bowel sounds are normal.     Palpations: Abdomen is soft.     Tenderness: There is no abdominal tenderness.   Musculoskeletal:     Cervical back: Normal range of motion and neck supple.     Right knee: Normal. No swelling, deformity, effusion, erythema, ecchymosis, lacerations or bony tenderness.  Normal alignment, normal meniscus and normal patellar mobility.     Instability Tests: Anterior drawer test negative. Posterior drawer test negative. Anterior Lachman test negative. Medial McMurray test negative and lateral McMurray test negative.     Left knee: Swelling, effusion and bony tenderness present. No crepitus. Decreased range of motion. Normal alignment, normal meniscus and normal patellar mobility.     Instability Tests: Anterior drawer test negative. Posterior drawer test negative. Anterior Lachman test negative. Medial McMurray test negative and lateral McMurray test negative.   Skin:    General: Skin is warm and dry.     Findings: No rash.   Neurological:     Mental Status: She is alert.   Psychiatric:        Mood and Affect: Mood is not anxious or depressed.        Speech: Speech normal.        Behavior: Behavior normal. Behavior is cooperative.        Thought Content: Thought content normal.        Judgment: Judgment normal.       Results for orders placed or performed in visit on 07/22/23  HM MAMMOGRAPHY   Collection Time: 07/21/23 12:00 AM  Result Value Ref Range   HM Mammogram 0-4 Bi-Rad 0-4 Bi-Rad, Self Reported Normal    Assessment and Plan  Acute pain of left knee Assessment & Plan: Acute, no known injury or fall. Most likely osteoarthritis flare. Can use Voltaren  gel up to 4 times a day for pain and inflammation.  Start glucosamine 500 mg 1-3 times a day and/or turmeric for joint inflammation/lubrication. Can wear knee brace if up on feet for prolonged time.  Continue home physical therapy.  Will evaluate with x-ray, nonurgent to assess severity of  osteoarthritis.  She will call if pain is not controlled for possible 2-week course of oral anti-inflammatory.  Return and ER precautions provided.  Orders: -     DG Knee Complete 4 Views Left; Future    No follow-ups on file.   Greig Ring, MD

## 2023-08-03 NOTE — Assessment & Plan Note (Signed)
 Acute, no known injury or fall. Most likely osteoarthritis flare. Can use Voltaren  gel up to 4 times a day for pain and inflammation.  Start glucosamine 500 mg 1-3 times a day and/or turmeric for joint inflammation/lubrication. Can wear knee brace if up on feet for prolonged time.  Continue home physical therapy.  Will evaluate with x-ray, nonurgent to assess severity of osteoarthritis.  She will call if pain is not controlled for possible 2-week course of oral anti-inflammatory.  Return and ER precautions provided.

## 2023-08-04 ENCOUNTER — Ambulatory Visit: Payer: Self-pay | Admitting: Family Medicine

## 2023-08-09 ENCOUNTER — Other Ambulatory Visit: Payer: Self-pay | Admitting: Family Medicine

## 2023-08-10 ENCOUNTER — Ambulatory Visit (INDEPENDENT_AMBULATORY_CARE_PROVIDER_SITE_OTHER): Admitting: Obstetrics and Gynecology

## 2023-08-10 ENCOUNTER — Encounter: Payer: Self-pay | Admitting: Obstetrics and Gynecology

## 2023-08-10 VITALS — BP 134/86 | HR 108 | Ht 66.5 in | Wt 193.0 lb

## 2023-08-10 DIAGNOSIS — A6009 Herpesviral infection of other urogenital tract: Secondary | ICD-10-CM | POA: Diagnosis not present

## 2023-08-10 DIAGNOSIS — Z01419 Encounter for gynecological examination (general) (routine) without abnormal findings: Secondary | ICD-10-CM

## 2023-08-10 DIAGNOSIS — R152 Fecal urgency: Secondary | ICD-10-CM | POA: Diagnosis not present

## 2023-08-10 DIAGNOSIS — N812 Incomplete uterovaginal prolapse: Secondary | ICD-10-CM | POA: Diagnosis not present

## 2023-08-10 DIAGNOSIS — Z9189 Other specified personal risk factors, not elsewhere classified: Secondary | ICD-10-CM

## 2023-08-10 MED ORDER — VALACYCLOVIR HCL 500 MG PO TABS: ORAL_TABLET | ORAL | 1 refills | Status: AC

## 2023-08-10 NOTE — Progress Notes (Unsigned)
 68 y.o. [id number] Married Caucasian female here for a breast and pelvic exam.    The patient is also followed for HSV and pelvic organ prolapse. Urinary leakage if jumps on a trampoline.  No daytime frequency.  Can get up once nightly to void and does drink 2 glasses of water before bed.  Voiding well.  Has IBS and diarrhea, which is not always controllable.  It may come on suddenly.   Not very sexually active.    Taking Valtrex  as needed 3 times a year.   Doing ok on Zoloft .    PCP: Avelina Greig BRAVO, MD   Patient's last menstrual period was 02/02/2006.           Sexually active: Yes.    The current method of family planning is post menopausal status.    Menopausal hormone therapy:  n/a Exercising: No.   Smoker:  no  OB History     Gravida  4   Para  4   Term  4   Preterm  0   AB  0   Living  4      SAB  0   IAB  0   Ectopic  0   Multiple  0   Live Births  4           HEALTH MAINTENANCE: Last 2 paps: 05/10/19 neg HR HPV neg, 09/24/15 neg  History of abnormal Pap or positive HPV:  no Mammogram:  07/21/23 Breast density Cat A, BIRADS Cat 1 neg  Colonoscopy:  04/20/07 - Cologuard 04/21/23 - negative.  Bone Density:  07/15/22  Result  osteopenia - bilateral hips.    Immunization History  Administered Date(s) Administered   Fluad Quad(high Dose 65+) 10/10/2021   Fluad Trivalent(High Dose 65+) 10/19/2022   Influenza Split 11/30/2012   Influenza Whole 11/18/2006, 11/30/2007, 11/12/2009   Influenza, High Dose Seasonal PF 10/01/2018, 10/30/2019, 10/15/2020   Influenza,inj,Quad PF,6+ Mos 11/16/2014, 10/10/2015, 10/01/2017   Influenza,inj,quad, With Preservative 11/25/2016   PFIZER(Purple Top)SARS-COV-2 Vaccination 02/19/2019, 03/11/2019, 11/06/2019   PNEUMOCOCCAL CONJUGATE-20 01/07/2023   Pfizer Covid-19 Vaccine Bivalent Booster 5yrs & up 10/21/2020   Pneumococcal Conjugate-13 09/13/2018   Pneumococcal Polysaccharide-23 02/02/2002, 01/09/2020   Td  09/13/2018   Tdap 12/27/2006   Zoster Recombinant(Shingrix) 10/01/2018, 01/02/2019   Zoster, Live 10/10/2015      reports that she has never smoked. She has never used smokeless tobacco. She reports current alcohol use. She reports that she does not use drugs.  Past Medical History:  Diagnosis Date   Achilles tendonitis    Allergy    Anxiety    Closed fracture of lateral portion of right tibial plateau 07/30/2017   COVID 2021   Depression    Diabetes mellitus    H/O renal calculi 10/08/2009   Hyperlipidemia    Hypertension    Serum calcium elevated    STD (sexually transmitted disease)    HSV I & II, IgG reflex positive   Tibial plateau fracture, right     Past Surgical History:  Procedure Laterality Date   APPENDECTOMY     CHOLECYSTECTOMY     ORIF TIBIA PLATEAU Right 07/30/2017   Procedure: OPEN REDUCTION INTERNAL FIXATION (ORIF) TIBIAL PLATEAU;  Surgeon: Josefina Chew, MD;  Location: [facility];  Service: Orthopedics;  Laterality: Right;   TONSILLECTOMY      Current Outpatient Medications  Medication Sig Dispense Refill   alendronate  (FOSAMAX ) 70 MG tablet TAKE 1 TABLET (70 MG TOTAL) BY MOUTH  ONCE A WEEK 12 tablet 3   BD PEN NEEDLE SHORT ULTRAFINE 31G X 8 MM MISC USE 1 PEN AS DIRECTED 100 each 3   Blood Glucose Monitoring Suppl (ONETOUCH VERIO REFLECT) w/Device KIT Check blood sugar twice a day and as directed. Dx E11.65 1 kit 0   cetirizine  (ZYRTEC ) 10 MG tablet Take 1 tablet (10 mg total) by mouth daily. 30 tablet 11   colchicine  0.6 MG tablet Take 1 tablet (0.6 mg total) by mouth 2 (two) times daily as needed. 180 tablet 0   Continuous Blood Gluc Receiver (FREESTYLE LIBRE 2 READER) DEVI Use to check blood sugar continuous due to low blood sugars at night.  Updated Rx 1 each 0   Continuous Glucose Sensor (FREESTYLE LIBRE 3 PLUS SENSOR) MISC 1 each by Does not apply route every 14 (fourteen) days. 12 each 3   fluticasone  (FLONASE ) 50 MCG/ACT nasal spray  SPRAY 2 SPRAYS INTO EACH NOSTRIL EVERY DAY 48 mL 3   glucose blood (ONETOUCH VERIO) test strip Use to check blood sugar twice daily 100 strip 3   insulin  aspart (NOVOLOG  FLEXPEN) 100 UNIT/ML FlexPen USING SLIDING SCALE TO DETERMINE MEALTIME DOSE SUBCUTANEOUS INJECTION, MAX DOSE PER AY 20 UNITS 15 mL 3   insulin  glargine (LANTUS  SOLOSTAR) 100 UNIT/ML Solostar Pen Inject 34 Units into the skin at bedtime. 15 mL 11   Lancet Devices (ONE TOUCH DELICA LANCING DEV) MISC Check blood sugar twice a day.  Dx E11.65 1 each 0   latanoprost (XALATAN) 0.005 % ophthalmic solution Place 1 drop into both eyes at bedtime.      lisinopril  (ZESTRIL ) 40 MG tablet TAKE 1 TABLET BY MOUTH EVERY DAY 90 tablet 3   loperamide (IMODIUM) 2 MG capsule Take 2 mg by mouth as needed.     metFORMIN  (GLUCOPHAGE -XR) 500 MG 24 hr tablet TAKE 2 TABLETS BY MOUTH TWICE A DAY 360 tablet 1   nitroGLYCERIN  (NITRODUR - DOSED IN MG/24 HR) 0.2 mg/hr patch Place 1 patch (0.2 mg total) onto the skin daily. Apply to Achilles tendon 30 patch 0   pravastatin  (PRAVACHOL ) 40 MG tablet TAKE 1 TABLET BY MOUTH EVERY DAY 90 tablet 3   sertraline  (ZOLOFT ) 25 MG tablet TAKE 1 TABLET (25 MG TOTAL) BY MOUTH DAILY. 90 tablet 1   SYSTANE ULTRA 0.4-0.3 % SOLN SMARTSIG:1 Drop(s) In Eye(s) As Needed     valACYclovir  (VALTREX ) 500 MG tablet TAKE 1 TABLET BY MOUTH 2 TIMES DAILY, TAKE FOR 3 DAYS FOR ACUTE FLARE 180 tablet 0   No current facility-administered medications for this visit.    ALLERGIES: Amlodipine   Family History  Problem Relation Age of Onset   COPD Mother    Diabetes Mother    Macular degeneration Mother    Depression Father    Diabetes Father    Diabetes Brother    Cancer Other        liver   Diabetes Other     Review of Systems  All other systems reviewed and are negative.   PHYSICAL EXAM:  BP 134/86 (BP Location: Right Arm, Patient Position: Sitting)   Pulse (!) 108   Ht 5' 6.5 (1.689 m)   Wt 193 lb (87.5 kg)   LMP  02/02/2006   SpO2 95%   BMI 30.68 kg/m     General appearance: alert, cooperative and appears stated age Head: normocephalic, without obvious abnormality, atraumatic Neck: no adenopathy, supple, symmetrical, trachea midline and thyroid  normal to inspection and palpation Lungs: clear to  auscultation bilaterally Breasts: normal appearance, no masses or tenderness, No nipple retraction or dimpling, No nipple discharge or bleeding, No axillary adenopathy Heart: regular rate and rhythm Abdomen: soft, non-tender; no masses, no organomegaly Extremities: extremities normal, atraumatic, no cyanosis or edema Skin: skin color, texture, turgor normal. No rashes or lesions Lymph nodes: cervical, supraclavicular, and axillary nodes normal. Neurologic: grossly normal  Pelvic: External genitalia:  no lesions              No abnormal inguinal nodes palpated.              Urethra:  normal appearing urethra with no masses, tenderness or lesions              Bartholins and Skenes: normal                 Vagina: normal appearing vagina with normal color and discharge, no lesions.  Second degree cystocele, first degree uterine prolapse, first degree rectocele.              Cervix: no lesions              Pap taken: no Bimanual Exam:  Uterus:  normal size, contour, position, consistency, mobility, non-tender              Adnexa: no mass, fullness, tenderness              Rectal exam: yes.  Confirms.              Anus:  normal sphincter tone, no lesions  Chaperone was present for exam:  Kari HERO, CMA  ASSESSMENT: Encounter for breast and pelvic exam.  Personal history of other risk factors.  Hx HSV I and II.   Second degree cystocele, first degree uterine prolapse, first degree rectocele.  Relatively asymptomatic.  Hx elevated calcium. Osteopenia.  On Fosamax .  PCP prescribing.  DM. IBS diarrhea.   PLAN: Mammogram screening discussed. Self breast awareness reviewed. Pap and HRV collected:  no. Due  in 2026.  Guidelines for Calcium, Vitamin D , regular exercise program including cardiovascular and weight bearing exercise. Medication refills:  Valtrex  500 mg po bid x 3 days prn.  #30, RF 1.  Try OTC Metamucil to help with fecal urgency.   Labs with PCP.  Follow up:  1 year for breast and pelvic exam and recheck.     Additional counseling given.  yes. 25 min  total time was spent for this patient encounter, including preparation, face-to-face counseling with the patient, coordination of care, and documentation of the encounter in addition to doing the breast and pelvic exam.

## 2023-08-10 NOTE — Patient Instructions (Signed)

## 2023-08-16 DIAGNOSIS — M25562 Pain in left knee: Secondary | ICD-10-CM | POA: Diagnosis not present

## 2023-08-23 DIAGNOSIS — M25562 Pain in left knee: Secondary | ICD-10-CM | POA: Diagnosis not present

## 2023-09-06 DIAGNOSIS — M1712 Unilateral primary osteoarthritis, left knee: Secondary | ICD-10-CM | POA: Diagnosis not present

## 2023-10-05 ENCOUNTER — Ambulatory Visit: Payer: Self-pay

## 2023-10-05 NOTE — Telephone Encounter (Signed)
 FYI Only or Action Required?: FYI only for provider.  Patient was last seen in primary care on 08/03/2023 by Avelina Greig BRAVO, MD.  Called Nurse Triage reporting Urinary Tract Infection.  Symptoms began several weeks ago.  Interventions attempted: Nothing.  Symptoms are: unchanged.  Triage Disposition: See Physician Within 24 Hours  Patient/caregiver understands and will follow disposition?: Yes, will follow disposition  Copied from CRM #[id number]. Topic: Clinical - Red Word Triage >> Oct 05, 2023  8:32 AM Tiffini S wrote: [facility] that prompted transfer to Nurse Triage: Urine with cloudiness and odor- started a few weeks ago- no pain/ discomfort Reason for Disposition  Bad or foul-smelling urine  Answer Assessment - Initial Assessment Questions 1. SYMPTOM: What's the main symptom you're concerned about? (e.g., frequency, incontinence)     Odorous urine, cloudy urine 2. ONSET: When did the  odor/cloudy urine  start?     Few weeks 3. PAIN: Is there any pain? If Yes, ask: How bad is it? (Scale: 1-10; mild, moderate, severe)     denies 4. CAUSE: What do you think is causing the symptoms?     UTI 5. OTHER SYMPTOMS: Do you have any other symptoms? (e.g., blood in urine, fever, flank pain, pain with urination)     Denies  Pt states that she is DM2 and blood sugars have been higher. Pt denies fevers. States that sugars have been higher in AM.  Protocols used: Urinary Symptoms-A-AH

## 2023-10-05 NOTE — Telephone Encounter (Signed)
 Per Veva, patient stopped by and picked up supplies.

## 2023-10-05 NOTE — Telephone Encounter (Signed)
 FYI: It looks like triage told her to come in for collection materials. Will see patient tomorrow.

## 2023-10-06 ENCOUNTER — Encounter: Payer: Self-pay | Admitting: Family Medicine

## 2023-10-06 ENCOUNTER — Ambulatory Visit (INDEPENDENT_AMBULATORY_CARE_PROVIDER_SITE_OTHER): Admitting: Family Medicine

## 2023-10-06 VITALS — BP 118/62 | HR 86 | Temp 98.1°F | Ht 66.5 in | Wt 192.6 lb

## 2023-10-06 DIAGNOSIS — E1165 Type 2 diabetes mellitus with hyperglycemia: Secondary | ICD-10-CM | POA: Diagnosis not present

## 2023-10-06 DIAGNOSIS — R8290 Unspecified abnormal findings in urine: Secondary | ICD-10-CM

## 2023-10-06 LAB — POC URINALSYSI DIPSTICK (AUTOMATED)
Bilirubin, UA: NEGATIVE
Blood, UA: NEGATIVE
Glucose, UA: NEGATIVE
Ketones, UA: NEGATIVE
Nitrite, UA: NEGATIVE
Protein, UA: NEGATIVE
Spec Grav, UA: 1.025 (ref 1.010–1.025)
Urobilinogen, UA: NEGATIVE U/dL — AB
pH, UA: 6 (ref 5.0–8.0)

## 2023-10-06 NOTE — Assessment & Plan Note (Signed)
 Chronic, very poorly controlled.  Patient to return to see endocrinology for repeat evaluation ASAP.

## 2023-10-06 NOTE — Addendum Note (Signed)
 Addended by: HOPE VEVA PARAS on: 10/06/2023 02:46 PM   Modules accepted: Orders

## 2023-10-06 NOTE — Progress Notes (Signed)
 Patient ID: Jean Robinson, female    DOB: [birth year], 68 y.o.   MRN: [id number]  This visit was conducted in person.  BP 118/62   Pulse 86   Temp 98.1 F (36.7 C) (Oral)   Ht 5' 6.5 (1.689 m)   Wt 192 lb 9.6 oz (87.4 kg)   LMP 02/02/2006   SpO2 97%   BMI 30.62 kg/m    CC:  Chief Complaint  Patient presents with  . Urine Odor    Ongoing for a couple of weeks. Been cloudy    Subjective:   HPI: Jean Robinson is a 68 y.o. female presenting on 10/06/2023 for Urine Odor (Ongoing for a couple of weeks. Been cloudy)   She reports new urine odor in last weeks. Urine is cloudy.  No dysuria, no change in frequency or urgency.  No blood in urine.  No abdominal pain, no flank pain.   Pt with diabetes. Followed by ENDO.. she has not been lately Last A1C per patient   FBS 166 Lab Results  Component Value Date   HGBA1C 10.1 03/04/2023  Lantus , NovoLog , metformin    Only new med meloxicam  and tramadol  but took only for short time, none now for left knee issues.  Dr. Josefina. Orttho.   No dietary changes.     She drinking 6 glasses 3of water a day.  Relevant past medical, surgical, family and social history reviewed and updated as indicated. Interim medical history since our last visit reviewed. Allergies and medications reviewed and updated. Outpatient Medications Prior to Visit  Medication Sig Dispense Refill  . alendronate  (FOSAMAX ) 70 MG tablet TAKE 1 TABLET (70 MG TOTAL) BY MOUTH ONCE A WEEK 12 tablet 3  . BD PEN NEEDLE SHORT ULTRAFINE 31G X 8 MM MISC USE 1 PEN AS DIRECTED 100 each 3  . Blood Glucose Monitoring Suppl (ONETOUCH VERIO REFLECT) w/Device KIT Check blood sugar twice a day and as directed. Dx E11.65 1 kit 0  . cetirizine  (ZYRTEC ) 10 MG tablet Take 1 tablet (10 mg total) by mouth daily. 30 tablet 11  . colchicine  0.6 MG tablet Take 1 tablet (0.6 mg total) by mouth 2 (two) times daily as needed. 180 tablet 0  . Continuous Blood Gluc Receiver (FREESTYLE LIBRE 2 READER)  DEVI Use to check blood sugar continuous due to low blood sugars at night.  Updated Rx 1 each 0  . Continuous Glucose Sensor (FREESTYLE LIBRE 3 PLUS SENSOR) MISC 1 each by Does not apply route every 14 (fourteen) days. 12 each 3  . fluticasone  (FLONASE ) 50 MCG/ACT nasal spray SPRAY 2 SPRAYS INTO EACH NOSTRIL EVERY DAY 48 mL 3  . glucose blood (ONETOUCH VERIO) test strip Use to check blood sugar twice daily 100 strip 3  . insulin  aspart (NOVOLOG  FLEXPEN) 100 UNIT/ML FlexPen USING SLIDING SCALE TO DETERMINE MEALTIME DOSE SUBCUTANEOUS INJECTION, MAX DOSE PER AY 20 UNITS 15 mL 3  . insulin  glargine (LANTUS  SOLOSTAR) 100 UNIT/ML Solostar Pen Inject 34 Units into the skin at bedtime. 15 mL 11  . Lancet Devices (ONE TOUCH DELICA LANCING DEV) MISC Check blood sugar twice a day.  Dx E11.65 1 each 0  . latanoprost (XALATAN) 0.005 % ophthalmic solution Place 1 drop into both eyes at bedtime.     . lisinopril  (ZESTRIL ) 40 MG tablet TAKE 1 TABLET BY MOUTH EVERY DAY 90 tablet 3  . loperamide (IMODIUM) 2 MG capsule Take 2 mg by mouth as needed.    . meloxicam  (  MOBIC ) 15 MG tablet Take 15 mg by mouth as needed for pain.    . metFORMIN  (GLUCOPHAGE -XR) 500 MG 24 hr tablet TAKE 2 TABLETS BY MOUTH TWICE A DAY 360 tablet 1  . nitroGLYCERIN  (NITRODUR - DOSED IN MG/24 HR) 0.2 mg/hr patch Place 1 patch (0.2 mg total) onto the skin daily. Apply to Achilles tendon 30 patch 0  . pravastatin  (PRAVACHOL ) 40 MG tablet TAKE 1 TABLET BY MOUTH EVERY DAY 90 tablet 3  . sertraline  (ZOLOFT ) 25 MG tablet TAKE 1 TABLET (25 MG TOTAL) BY MOUTH DAILY. 90 tablet 1  . SYSTANE ULTRA 0.4-0.3 % SOLN SMARTSIG:1 Drop(s) In Eye(s) As Needed    . valACYclovir  (VALTREX ) 500 MG tablet TAKE 1 TABLET BY MOUTH 2 TIMES DAILY, TAKE FOR 3 DAYS FOR ACUTE FLARE 30 tablet 1   No facility-administered medications prior to visit.     Per HPI unless specifically indicated in ROS section below Review of Systems  Constitutional:  Negative for fatigue and  fever.  HENT:  Negative for congestion.   Eyes:  Negative for pain.  Respiratory:  Negative for cough and shortness of breath.   Cardiovascular:  Negative for chest pain, palpitations and leg swelling.  Gastrointestinal:  Negative for abdominal pain.  Genitourinary:  Negative for dysuria and vaginal bleeding.  Musculoskeletal:  Negative for back pain.  Neurological:  Negative for syncope, light-headedness and headaches.  Psychiatric/Behavioral:  Negative for dysphoric mood.    Objective:  BP 118/62   Pulse 86   Temp 98.1 F (36.7 C) (Oral)   Ht 5' 6.5 (1.689 m)   Wt 192 lb 9.6 oz (87.4 kg)   LMP 02/02/2006   SpO2 97%   BMI 30.62 kg/m   Wt Readings from Last 3 Encounters:  10/06/23 192 lb 9.6 oz (87.4 kg)  08/10/23 193 lb (87.5 kg)  08/03/23 193 lb 2 oz (87.6 kg)      Physical Exam Constitutional:      General: She is not in acute distress.    Appearance: Normal appearance. She is well-developed. She is not ill-appearing or toxic-appearing.  HENT:     Head: Normocephalic.     Right Ear: Hearing, tympanic membrane, ear canal and external ear normal. Tympanic membrane is not erythematous, retracted or bulging.     Left Ear: Hearing, tympanic membrane, ear canal and external ear normal. Tympanic membrane is not erythematous, retracted or bulging.     Nose: No mucosal edema or rhinorrhea.     Right Sinus: No maxillary sinus tenderness or frontal sinus tenderness.     Left Sinus: No maxillary sinus tenderness or frontal sinus tenderness.     Mouth/Throat:     Pharynx: Uvula midline.  Eyes:     General: Lids are normal. Lids are everted, no foreign bodies appreciated.     Conjunctiva/sclera: Conjunctivae normal.     Pupils: Pupils are equal, round, and reactive to light.  Neck:     Thyroid : No thyroid  mass or thyromegaly.     Vascular: No carotid bruit.     Trachea: Trachea normal.  Cardiovascular:     Rate and Rhythm: Normal rate and regular rhythm.     Pulses: Normal  pulses.     Heart sounds: Normal heart sounds, S1 normal and S2 normal. No murmur heard.    No friction rub. No gallop.  Pulmonary:     Effort: Pulmonary effort is normal. No tachypnea or respiratory distress.     Breath sounds: Normal breath sounds.  No decreased breath sounds, wheezing, rhonchi or rales.  Abdominal:     General: Bowel sounds are normal.     Palpations: Abdomen is soft.     Tenderness: There is no abdominal tenderness.  Musculoskeletal:     Cervical back: Normal range of motion and neck supple.  Skin:    General: Skin is warm and dry.     Findings: No rash.  Neurological:     Mental Status: She is alert.  Psychiatric:        Mood and Affect: Mood is not anxious or depressed.        Speech: Speech normal.        Behavior: Behavior normal. Behavior is cooperative.        Thought Content: Thought content normal.        Judgment: Judgment normal.       Results for orders placed or performed in visit on 10/06/23  POCT Urinalysis Dipstick (Automated)   Collection Time: 10/06/23 12:52 PM  Result Value Ref Range   Color, UA Light Yellow    Clarity, UA Cloudy    Glucose, UA Negative Negative   Bilirubin, UA negative    Ketones, UA negative    Spec Grav, UA 1.025 1.010 - 1.025   Blood, UA negative    pH, UA 6.0 5.0 - 8.0   Protein, UA Negative Negative   Urobilinogen, UA negative (A) 0.2 or 1.0 E.U./dL   Nitrite, UA negative    Leukocytes, UA Trace (A) Negative    Assessment and Plan  Abnormal urine odor Assessment & Plan: Acute, possibly secondary to concentrated urine and mild dehydration in setting of poorly controlled diabetes.  Surprisingly there was no glucose in her urine. Encouraged her to push fluids.  No clear medication or food related cause. Urinalysis unremarkable but given trace leukocytes we will send for culture.  Return and ER precautions provided.  Orders: -     POCT Urinalysis Dipstick (Automated)  Uncontrolled type 2 diabetes  mellitus with hyperglycemia (HCC) Assessment & Plan: Chronic, very poorly controlled.  Patient to return to see endocrinology for repeat evaluation ASAP.     No follow-ups on file.   Greig Ring, MD

## 2023-10-06 NOTE — Assessment & Plan Note (Signed)
 Acute, possibly secondary to concentrated urine and mild dehydration in setting of poorly controlled diabetes.  Surprisingly there was no glucose in her urine. Encouraged her to push fluids.  No clear medication or food related cause. Urinalysis unremarkable but given trace leukocytes we will send for culture.  Return and ER precautions provided.

## 2023-10-07 LAB — URINE CULTURE
MICRO NUMBER:: 16917039
SPECIMEN QUALITY:: ADEQUATE

## 2023-10-08 ENCOUNTER — Ambulatory Visit: Payer: Self-pay | Admitting: Family Medicine

## 2023-10-08 ENCOUNTER — Encounter: Payer: Self-pay | Admitting: Family Medicine

## 2023-10-13 DIAGNOSIS — M1712 Unilateral primary osteoarthritis, left knee: Secondary | ICD-10-CM | POA: Diagnosis not present

## 2023-10-25 ENCOUNTER — Other Ambulatory Visit: Payer: Self-pay | Admitting: Family Medicine

## 2023-10-25 DIAGNOSIS — J029 Acute pharyngitis, unspecified: Secondary | ICD-10-CM

## 2023-10-26 ENCOUNTER — Encounter: Payer: Self-pay | Admitting: Emergency Medicine

## 2023-10-26 ENCOUNTER — Ambulatory Visit: Admission: EM | Admit: 2023-10-26 | Discharge: 2023-10-26 | Disposition: A | Discharge status: Home / Self Care

## 2023-10-26 DIAGNOSIS — Z5189 Encounter for other specified aftercare: Secondary | ICD-10-CM

## 2023-10-26 MED ORDER — DOXYCYCLINE HYCLATE 100 MG PO CAPS: 100.0000 mg | ORAL_CAPSULE | Freq: Two times a day (BID) | ORAL | 0 refills | Status: AC

## 2023-10-26 NOTE — Discharge Instructions (Signed)
  1. Visit for wound check (Primary) - doxycycline  (VIBRAMYCIN ) 100 MG capsule; Take 1 capsule (100 mg total) by mouth 2 (two) times daily for 5 days.  Dispense: 10 capsule; Refill: 0 -Continue to monitor symptoms for any change in severity if there is any escalation of current symptoms or development of new symptoms follow-up in ER or return to UC for further evaluation and management.

## 2023-10-26 NOTE — ED Provider Notes (Signed)
 [facility]  Note:  This document was prepared using Dragon voice recognition software and may include unintentional dictation errors.  MRN: [id number] DOB: 1955-05-11  Subjective:   Jean Robinson is a 68 y.o. female presenting for a wound to her right shin since last Friday.  Patient reports that she fell at her friend's house hitting her shin sprinkler head causing a moderate abrasion.  Patient reports that she initially did not have any concern for infection however the last couple of days she has had increased redness and swelling to the area.  Patient has history of diabetes type 2 and is concern for secondary infection.  Denies any severe pain, purulent drainage, increased swelling, or warmth  No current facility-administered medications for this encounter.  Current Outpatient Medications:    doxycycline  (VIBRAMYCIN ) 100 MG capsule, Take 1 capsule (100 mg total) by mouth 2 (two) times daily for 5 days., Disp: 10 capsule, Rfl: 0   alendronate  (FOSAMAX ) 70 MG tablet, TAKE 1 TABLET (70 MG TOTAL) BY MOUTH ONCE A WEEK, Disp: 12 tablet, Rfl: 3   BD PEN NEEDLE SHORT ULTRAFINE 31G X 8 MM MISC, USE 1 PEN AS DIRECTED, Disp: 100 each, Rfl: 3   Blood Glucose Monitoring Suppl (ONETOUCH VERIO REFLECT) w/Device KIT, Check blood sugar twice a day and as directed. Dx E11.65, Disp: 1 kit, Rfl: 0   cetirizine  (ZYRTEC ) 10 MG tablet, Take 1 tablet (10 mg total) by mouth daily., Disp: 30 tablet, Rfl: 11   colchicine  0.6 MG tablet, Take 1 tablet (0.6 mg total) by mouth 2 (two) times daily as needed., Disp: 180 tablet, Rfl: 0   Continuous Blood Gluc Receiver (FREESTYLE LIBRE 2 READER) DEVI, Use to check blood sugar continuous due to low blood sugars at night.  Updated Rx, Disp: 1 each, Rfl: 0   Continuous Glucose Sensor (FREESTYLE LIBRE 3 PLUS SENSOR) MISC, 1 each by Does not apply route every 14 (fourteen) days., Disp: 12 each, Rfl: 3   fluticasone  (FLONASE ) 50 MCG/ACT nasal spray, SPRAY 2  SPRAYS INTO EACH NOSTRIL EVERY DAY, Disp: 48 mL, Rfl: 3   glucose blood (ONETOUCH VERIO) test strip, Use to check blood sugar twice daily, Disp: 100 strip, Rfl: 3   insulin  aspart (NOVOLOG  FLEXPEN) 100 UNIT/ML FlexPen, USING SLIDING SCALE TO DETERMINE MEALTIME DOSE SUBCUTANEOUS INJECTION, MAX DOSE PER AY 20 UNITS, Disp: 15 mL, Rfl: 3   insulin  glargine (LANTUS  SOLOSTAR) 100 UNIT/ML Solostar Pen, Inject 34 Units into the skin at bedtime., Disp: 15 mL, Rfl: 11   Lancet Devices (ONE TOUCH DELICA LANCING DEV) MISC, Check blood sugar twice a day.  Dx E11.65, Disp: 1 each, Rfl: 0   latanoprost (XALATAN) 0.005 % ophthalmic solution, Place 1 drop into both eyes at bedtime. , Disp: , Rfl:    lisinopril  (ZESTRIL ) 40 MG tablet, TAKE 1 TABLET BY MOUTH EVERY DAY, Disp: 90 tablet, Rfl: 3   loperamide (IMODIUM) 2 MG capsule, Take 2 mg by mouth as needed., Disp: , Rfl:    meloxicam  (MOBIC ) 15 MG tablet, Take 15 mg by mouth as needed for pain., Disp: , Rfl:    metFORMIN  (GLUCOPHAGE -XR) 500 MG 24 hr tablet, TAKE 2 TABLETS BY MOUTH TWICE A DAY, Disp: 360 tablet, Rfl: 1   nitroGLYCERIN  (NITRODUR - DOSED IN MG/24 HR) 0.2 mg/hr patch, Place 1 patch (0.2 mg total) onto the skin daily. Apply to Achilles tendon, Disp: 30 patch, Rfl: 0   pravastatin  (PRAVACHOL ) 40 MG tablet, TAKE 1 TABLET BY  MOUTH EVERY DAY, Disp: 90 tablet, Rfl: 3   sertraline  (ZOLOFT ) 25 MG tablet, TAKE 1 TABLET (25 MG TOTAL) BY MOUTH DAILY., Disp: 90 tablet, Rfl: 1   SYSTANE ULTRA 0.4-0.3 % SOLN, SMARTSIG:1 Drop(s) In Eye(s) As Needed, Disp: , Rfl:    valACYclovir  (VALTREX ) 500 MG tablet, TAKE 1 TABLET BY MOUTH 2 TIMES DAILY, TAKE FOR 3 DAYS FOR ACUTE FLARE, Disp: 30 tablet, Rfl: 1   Allergies  Allergen Reactions   Amlodipine  Swelling    Past Medical History:  Diagnosis Date   Achilles tendonitis    Allergy    Anxiety    Closed fracture of lateral portion of right tibial plateau 07/30/2017   COVID 2021   Depression    Diabetes mellitus    H/O  renal calculi 10/08/2009   Hyperlipidemia    Hypertension    Serum calcium elevated    STD (sexually transmitted disease)    HSV I & II, IgG reflex positive   Tibial plateau fracture, right      Past Surgical History:  Procedure Laterality Date   APPENDECTOMY     CHOLECYSTECTOMY     ORIF TIBIA PLATEAU Right 07/30/2017   Procedure: OPEN REDUCTION INTERNAL FIXATION (ORIF) TIBIAL PLATEAU;  Surgeon: Josefina Chew, MD;  Location: [facility];  Service: Orthopedics;  Laterality: Right;   TONSILLECTOMY      Family History  Problem Relation Age of Onset   COPD Mother    Diabetes Mother    Macular degeneration Mother    Depression Father    Diabetes Father    Diabetes Brother    Cancer Other        liver   Diabetes Other     Social History   Tobacco Use   Smoking status: Never   Smokeless tobacco: Never  Vaping Use   Vaping status: Never Used  Substance Use Topics   Alcohol use: Yes    Alcohol/week: 0.0 - 2.0 standard drinks of alcohol    Comment: 2 drinks/month   Drug use: No    ROS Refer to HPI for ROS details.  Objective:   Vitals: BP (!) 185/97 (BP Location: Right Arm)   Pulse 99   Temp 98 F (36.7 C)   Resp 17   LMP 02/02/2006   SpO2 94%   Physical Exam Vitals and nursing note reviewed.  Constitutional:      General: She is not in acute distress.    Appearance: Normal appearance. She is not ill-appearing.  HENT:     Head: Normocephalic.  Cardiovascular:     Rate and Rhythm: Normal rate.  Pulmonary:     Effort: Pulmonary effort is normal. No respiratory distress.  Skin:    General: Skin is warm and dry.     Capillary Refill: Capillary refill takes less than 2 seconds.     Findings: Abrasion, erythema and wound present. No bruising.  Neurological:     General: No focal deficit present.     Mental Status: She is alert and oriented to person, place, and time.  Psychiatric:        Mood and Affect: Mood normal.        Behavior:  Behavior normal.     Procedures  No results found for this or any previous visit (from the past 24 hours).  No results found.   Assessment and Plan :     Discharge Instructions       1. Visit for wound check (Primary) -  doxycycline  (VIBRAMYCIN ) 100 MG capsule; Take 1 capsule (100 mg total) by mouth 2 (two) times daily for 5 days.  Dispense: 10 capsule; Refill: 0 -Continue to monitor symptoms for any change in severity if there is any escalation of current symptoms or development of new symptoms follow-up in ER or return to UC for further evaluation and management.      Evangelyne Loja B Achaia Garlock   Lanore Renderos, Margaret B, [place] 10/26/23 [phone]

## 2023-10-26 NOTE — ED Triage Notes (Signed)
 Pt fell Friday night outside at friends house. She hit right shin on sprinkler and presents today due to wound getting red and painful.   Td last was 09/13/2018

## 2023-11-10 DIAGNOSIS — H401131 Primary open-angle glaucoma, bilateral, mild stage: Secondary | ICD-10-CM | POA: Diagnosis not present

## 2023-11-24 ENCOUNTER — Other Ambulatory Visit: Payer: Self-pay | Admitting: Family Medicine

## 2023-12-09 ENCOUNTER — Other Ambulatory Visit: Payer: Self-pay | Admitting: Family Medicine

## 2023-12-23 ENCOUNTER — Encounter: Payer: Self-pay | Admitting: Family Medicine

## 2023-12-23 MED ORDER — NOVOLOG FLEXPEN 100 UNIT/ML ~~LOC~~ SOPN: PEN_INJECTOR | SUBCUTANEOUS | 3 refills | Status: AC

## 2023-12-23 NOTE — Telephone Encounter (Signed)
 Please advise because Max Dose on Rx states 20 units and patient states she is taking 20 to 22 units twice a day.

## 2023-12-24 NOTE — Telephone Encounter (Unsigned)
 Copied from CRM #[id number]. Topic: Clinical - Medication Question >> Dec 24, 2023  9:43 AM Mercedes MATSU wrote: Reason for CRM: Patient called in requesting a update in regards to a mychart message she sent about her novolog .

## 2024-02-09 ENCOUNTER — Encounter: Payer: Self-pay | Admitting: Family Medicine

## 2024-02-09 MED ORDER — BENZONATATE 200 MG PO CAPS: 200.0000 mg | ORAL_CAPSULE | Freq: Two times a day (BID) | ORAL | 0 refills | Status: AC | PRN

## 2024-04-04 ENCOUNTER — Ambulatory Visit: Payer: PPO

## 2024-09-04 ENCOUNTER — Encounter: Admitting: Obstetrics and Gynecology
